# Patient Record
Sex: Female | Born: 1990 | Race: Black or African American | Hispanic: No | Marital: Single | Smoking: Current every day smoker
Health system: Southern US, Community
[De-identification: ages and names within clinical notes are randomized; demographics above are authoritative.]

## PROBLEM LIST (undated history)

## (undated) ENCOUNTER — Emergency Department (HOSPITAL_COMMUNITY): Discharge status: Absent without leave | Payer: Self-pay

## (undated) DIAGNOSIS — O139 Gestational [pregnancy-induced] hypertension without significant proteinuria, unspecified trimester: Secondary | ICD-10-CM

## (undated) DIAGNOSIS — E119 Type 2 diabetes mellitus without complications: Secondary | ICD-10-CM

## (undated) DIAGNOSIS — O365990 Maternal care for other known or suspected poor fetal growth, unspecified trimester, not applicable or unspecified: Secondary | ICD-10-CM

## (undated) DIAGNOSIS — N390 Urinary tract infection, site not specified: Secondary | ICD-10-CM

## (undated) DIAGNOSIS — Z22330 Carrier of Group B streptococcus: Secondary | ICD-10-CM

## (undated) HISTORY — PX: HERNIA REPAIR: SHX51

## (undated) HISTORY — DX: Carrier of group B Streptococcus: Z22.330

---
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
## 2011-01-03 NOTE — Discharge Summary (Signed)
  NAMEAFTEN, LIPSEY              ACCOUNT NO.:  [id number]  MEDICAL RECORD NO.:  [id number]           PATIENT TYPE:  I  LOCATION:  9306                          FACILITY:  [facility]  PHYSICIAN:  Lesly Dukes, M.D. DATE OF BIRTH:  1991-04-12  DATE OF ADMISSION:  12/15/2010 DATE OF DISCHARGE:  12/18/2010                              DISCHARGE SUMMARY   HOSPITAL COURSE:  The patient is a 20 year old G3, para 2-0-0-2 who was admitted as 18 weeks and 5 days to the [facility] for pyelonephritis.  The patient's primary care doctor is Dr. Wallene Huh at [facility].  The patient had back pain, chills and fever and also had nausea, vomiting, and diarrhea.  The patient was started on Rocephin 1 gram q.12 h.  The patient defervesced quickly and started feeling remarkably better.  She has been afebrile approximately at 20 hours at this point and is ready for discharge.  She is tolerating solid food and is not having any more diarrhea.  Her white cell count was originally 31.8 and decreased to 10.  Her urine culture E. coli sensitive to cefazolin.  PAST MEDICAL HISTORY:  Asthma and anemia.  PAST SURGICAL HISTORY:  Hernia repair.  LABORATORY DATA:  White blood cell count 10, hemoglobin 11.5, hematocrit 32.9.  Urine culture E. coli sensitive to Ancef.  MEDICATIONS ON DISCHARGE: 1. Albuterol inhaler 2 puffs every 6 hours p.r.n. 2. Prenatal vitamin 1 tablet p.o. daily. 3. Keflex 500 mg 1 tablet p.o. q.6 h. for 12 days and 1 tablet p.o.     daily.  DISCHARGE INSTRUCTIONS: 1. The patient to take temperature. If feel hot, rigors, or chills to     come to the emergency room with any of those problems. 2. The patient to return to emergency room with flank pain. 3. Continue prenatal precautions.  DISCHARGE APPOINTMENT:  The patient states she has an appointment already set up with Dr. Wallene Huh on June 8.     Lesly Dukes, M.D.    Lora Paula  D:  12/18/2010  T:   12/18/2010  Job:  [id number]  Electronically Signed by Elsie Lincoln M.D. on 01/03/2011 01:41:36 PM

## 2011-01-21 ENCOUNTER — Ambulatory Visit (INDEPENDENT_AMBULATORY_CARE_PROVIDER_SITE_OTHER): Payer: Medicaid Other | Admitting: Family Medicine

## 2011-01-21 DIAGNOSIS — F17200 Nicotine dependence, unspecified, uncomplicated: Secondary | ICD-10-CM

## 2011-01-21 DIAGNOSIS — Z3480 Encounter for supervision of other normal pregnancy, unspecified trimester: Secondary | ICD-10-CM

## 2011-01-21 DIAGNOSIS — N12 Tubulo-interstitial nephritis, not specified as acute or chronic: Secondary | ICD-10-CM

## 2011-01-21 DIAGNOSIS — Z22330 Carrier of Group B streptococcus: Secondary | ICD-10-CM

## 2011-01-21 DIAGNOSIS — O2390 Unspecified genitourinary tract infection in pregnancy, unspecified trimester: Secondary | ICD-10-CM

## 2011-01-21 DIAGNOSIS — O2300 Infections of kidney in pregnancy, unspecified trimester: Secondary | ICD-10-CM

## 2011-01-21 NOTE — Patient Instructions (Signed)
I am concerned that the bites on your legs are from fleas.  You can try an strong anti-itch medicine called pramoxine, brand name is Itch-X.  You should also get a "flea bomb" for your house to get rid of them in the carpet.   Please make an appointment to be seen in 4 weeks, we will re-check your blood sugar with the sweet drink, so you will need to be here for an hour.

## 2011-01-22 ENCOUNTER — Encounter: Payer: Self-pay | Admitting: Family Medicine

## 2011-01-22 NOTE — Progress Notes (Signed)
Joanna [name] is a 20 year old G3P2002 @ [redacted]w[redacted]d by 12 week [place] (unknown LMP) who presents for follow up.  She had pyelonephritis during this pregnancy, and is taking prophylactic Keflex, no new symptoms.  She is currently smoking about 3 cigarettes a day, prior to finding out she was pregnant she was smoking about 1/2 ppd.   She complains about bites on her lower legs, is worired about scabies, but says they kept a dog in their home for a few weeks and she did see fleas.  Exam consistent with flea bites, not scabies.   - plan on 28 week labs at next visit, repeat Glucola, CBC, HIV, RPR. -pramoxine, brand name is Itch-X for bites, advised doing a "flea bomb" in her home to eradicate the fleas.  - Plans on breast feeding  - Declined genetic screening - Advised to quit smoking. Precontemplative about quitting all the way. Smoked throughout last pregnancies  - 20 year old, 20 year old at home  - Patient planning on attending parenting class, declines birthing classes or breast feeding classes.  - GBS positive urine culture, plan on PCN during labor - Pyelonephritis - now on daily Keflex  - Plans to bring baby to [facility]  - Uncertain about plans for contraception - Follow up in 4 weeks.

## 2011-02-02 ENCOUNTER — Inpatient Hospital Stay (INDEPENDENT_AMBULATORY_CARE_PROVIDER_SITE_OTHER)
Admission: RE | Admit: 2011-02-02 | Discharge: 2011-02-02 | Disposition: A | Discharge status: Home / Self Care | Payer: Medicaid Other | Source: Ambulatory Visit | Attending: Family Medicine | Admitting: Family Medicine

## 2011-02-02 DIAGNOSIS — H109 Unspecified conjunctivitis: Secondary | ICD-10-CM

## 2011-02-18 ENCOUNTER — Ambulatory Visit (INDEPENDENT_AMBULATORY_CARE_PROVIDER_SITE_OTHER): Payer: Medicaid Other | Admitting: Family Medicine

## 2011-02-18 VITALS — BP 115/69 | Wt 208.2 lb

## 2011-02-18 DIAGNOSIS — O2390 Unspecified genitourinary tract infection in pregnancy, unspecified trimester: Secondary | ICD-10-CM

## 2011-02-18 DIAGNOSIS — Z331 Pregnant state, incidental: Secondary | ICD-10-CM

## 2011-02-18 DIAGNOSIS — N12 Tubulo-interstitial nephritis, not specified as acute or chronic: Secondary | ICD-10-CM

## 2011-02-18 DIAGNOSIS — F17200 Nicotine dependence, unspecified, uncomplicated: Secondary | ICD-10-CM

## 2011-02-18 DIAGNOSIS — Z3480 Encounter for supervision of other normal pregnancy, unspecified trimester: Secondary | ICD-10-CM

## 2011-02-18 DIAGNOSIS — Z22330 Carrier of Group B streptococcus: Secondary | ICD-10-CM

## 2011-02-18 DIAGNOSIS — O2300 Infections of kidney in pregnancy, unspecified trimester: Secondary | ICD-10-CM

## 2011-02-18 LAB — CBC
HCT: 36.2 % (ref 36.0–46.0)
Hemoglobin: 12.4 g/dL (ref 12.0–15.0)
MCH: 30.4 pg (ref 26.0–34.0)
MCHC: 34.3 g/dL (ref 30.0–36.0)
RBC: 4.08 MIL/uL (ref 3.87–5.11)

## 2011-02-18 NOTE — Progress Notes (Signed)
Joanna [name] is a 20 year old G3P2002 @ [redacted]w[redacted]d by 12 week [place] (unknown LMP) who presents for follow up.  She had pyelonephritis during this pregnancy, and is taking prophylactic Keflex, no new symptoms.  She is currently smoking about 3 cigarettes a day.  She says she is not ready to quit, is not sure why. No complaints.   - plan on 28 week labs today,Glucola, CBC, HIV, RPR. - Plans on breast feeding  - Wants Mirena after delivery.  - Is having a boy, wants him circumcised, but not at [facility] due to price.  - Advised to quit smoking. Precontemplative about quitting all the way. Smoked throughout last pregnancies  - GBS positive urine culture, plan on PCN during labor - Pyelonephritis - now on daily Keflex  - Plans to bring baby to [facility]  - Follow up in 2 weeks.

## 2011-02-18 NOTE — Patient Instructions (Signed)
It was good to see you.  You are 27 weeks and 6 days along, and your due date is October 27th.  Keep taking your prenatal vitamin, and let me know if you have any questions about quitting smoking.

## 2011-02-19 LAB — RPR

## 2011-02-22 ENCOUNTER — Encounter: Payer: Self-pay | Admitting: Family Medicine

## 2011-03-04 ENCOUNTER — Ambulatory Visit (INDEPENDENT_AMBULATORY_CARE_PROVIDER_SITE_OTHER): Payer: Medicaid Other | Admitting: Family Medicine

## 2011-03-04 VITALS — BP 113/67 | Wt 208.0 lb

## 2011-03-04 DIAGNOSIS — Z3480 Encounter for supervision of other normal pregnancy, unspecified trimester: Secondary | ICD-10-CM

## 2011-03-04 NOTE — Progress Notes (Signed)
Asyah Candler is a 20 year old G3P2002 @ [redacted]w[redacted]d by 12 week [place] (unknown LMP) who presents for follow up.  She had pyelonephritis during this pregnancy, and is taking prophylactic Keflex.  She is currently smoking about 3 cigarettes a day. No complaints.   -  28 week labs reviewed.  - Plans on breast feeding  - Wants Mirena or Implanon after delivery.  - Is having a boy, wants him circumcised, but not at [facility] due to price, info given today for out patient clinics that do circs.  - Advised to quit smoking. Precontemplative about quitting all the way. Smoked throughout last pregnancies  - GBS positive urine culture, plan on PCN during labor - Pyelonephritis - now on daily Keflex  - Plans to bring baby to [facility]  - Follow up in 2 weeks with OB clinic.

## 2011-03-04 NOTE — Patient Instructions (Addendum)
It was good to see you.  All your labs your last visit were normal.  Please make an appointment to be seen here in our OB clinic, either in 2 weeks or 4 weeks, and to see me the other time.  Keep taking your prenatal vitamin.

## 2011-03-06 ENCOUNTER — Encounter (HOSPITAL_COMMUNITY): Payer: Self-pay | Admitting: *Deleted

## 2011-03-06 ENCOUNTER — Inpatient Hospital Stay (HOSPITAL_COMMUNITY)
Admission: AD | Admit: 2011-03-06 | Discharge: 2011-03-06 | Disposition: A | Discharge status: Home / Self Care | Payer: Medicaid Other | Source: Ambulatory Visit | Attending: Obstetrics & Gynecology | Admitting: Obstetrics & Gynecology

## 2011-03-06 DIAGNOSIS — N390 Urinary tract infection, site not specified: Secondary | ICD-10-CM | POA: Insufficient documentation

## 2011-03-06 DIAGNOSIS — O99891 Other specified diseases and conditions complicating pregnancy: Secondary | ICD-10-CM | POA: Insufficient documentation

## 2011-03-06 DIAGNOSIS — M549 Dorsalgia, unspecified: Secondary | ICD-10-CM

## 2011-03-06 DIAGNOSIS — O212 Late vomiting of pregnancy: Secondary | ICD-10-CM | POA: Insufficient documentation

## 2011-03-06 HISTORY — DX: Urinary tract infection, site not specified: N39.0

## 2011-03-06 LAB — URINALYSIS, MICROSCOPIC ONLY
Glucose, UA: NEGATIVE mg/dL
Leukocytes, UA: NEGATIVE
Protein, ur: NEGATIVE mg/dL
Specific Gravity, Urine: >1.03 — ABNORMAL HIGH (ref 1.005–1.030)
Urobilinogen, UA: 1 mg/dL (ref 0.0–1.0)

## 2011-03-06 MED ORDER — OXYCODONE-ACETAMINOPHEN 5-325 MG PO TABS
1.0000 | ORAL_TABLET | ORAL | Status: DC | PRN
  Administered 2011-03-06: 1 via ORAL
  Filled 2011-03-06: qty 1

## 2011-03-06 MED ORDER — ONDANSETRON HCL 4 MG PO TABS
8.0000 mg | ORAL_TABLET | Freq: Once | ORAL | Status: AC
  Administered 2011-03-06: 8 mg via ORAL
  Filled 2011-03-06: qty 2

## 2011-03-06 NOTE — ED Provider Notes (Signed)
History     CSN: [id number] Arrival date & time: 03/06/2011  4:42 PM  Chief Complaint  Patient presents with  . Emesis During Pregnancy  . Flank Pain   HPI  No past medical history on file.  No past surgical history on file.  No family history on file.  History  Substance Use Topics  . Smoking status: Current Everyday Smoker -- 0.2 packs/day    Types: Cigarettes  . Smokeless tobacco: Never Used  . Alcohol Use: No    OB History    Grav Para Term Preterm Abortions TAB SAB Ect Mult Living   3 2 2  0 0 0 0 0 0 2      Review of Systems  Physical Exam  BP 131/70  Pulse 94  Temp(Src) 97.9 F (36.6 C) (Oral)  Resp 18  Ht 5\' 3"  (1.6 m)  Wt 94.802 kg (209 lb)  BMI 37.02 kg/m2  Breastfeeding? Unknown  Physical Exam  ED Course  Procedures  MDM Urine clear with 15 ketones. Will po hydrate and if holds down fluis will d/c home.

## 2011-03-06 NOTE — Progress Notes (Signed)
Pt reports back pain for 2 weeks. Vomiting x3 today

## 2011-03-06 NOTE — Progress Notes (Signed)
Pt requesting d/c home. Stable maternal fetal unit will d/c.

## 2011-03-06 NOTE — ED Provider Notes (Signed)
History   in with c/o constant bilateral back pain and vomiting x 3 today.  Chief Complaint  Patient presents with  . Emesis During Pregnancy  . Flank Pain   HPI  OB History    Grav Para Term Preterm Abortions TAB SAB Ect Mult Living   3 2 2  0 0 0 0 0 0 2      No past medical history on file.  No past surgical history on file.  No family history on file.  History  Substance Use Topics  . Smoking status: Current Everyday Smoker -- 0.2 packs/day    Types: Cigarettes  . Smokeless tobacco: Never Used  . Alcohol Use: No    Allergies: No Known Allergies  Prescriptions prior to admission  Medication Sig Dispense Refill  . albuterol (VENTOLIN HFA) 108 (90 BASE) MCG/ACT inhaler Inhale 2 puffs into the lungs as needed. For shortness of breath      . cephALEXin (KEFLEX) 500 MG capsule Take 500 mg by mouth 4 (four) times daily. 1 tab PO QID starting 6/2 for 12 days then one tab PO daily through pregnancy      . Prenatal Multivit-Min-Fe-FA (PRENATAL VITAMINS) 0.8 MG tablet Take 1 tablet by mouth daily.         ROS Physical Exam   Blood pressure 131/70, pulse 94, temperature 97.9 F (36.6 C), temperature source Oral, resp. rate 18, height 5\' 3"  (1.6 m), weight 94.802 kg (209 lb), unknown if currently breastfeeding.  Physical Exam  Constitutional: She is oriented to person, place, and time. She appears well-developed and well-nourished.  HENT:  Head: Normocephalic.  Eyes: Conjunctivae are normal. Pupils are equal, round, and reactive to light.  Neck: Normal range of motion.  Cardiovascular: Normal rate, regular rhythm and normal heart sounds.   Respiratory: Effort normal and breath sounds normal.  GI: Soft. Bowel sounds are normal.       abd gravis and nontender.  Genitourinary: Vagina normal.  Musculoskeletal: Normal range of motion.  Neurological: She is alert and oriented to person, place, and time. She has normal reflexes.  Skin: Skin is warm and dry.  Psychiatric: She  has a normal mood and affect. Her behavior is normal. Judgment and thought content normal.    MAU Course  Procedures urinalysis  MDM UTI vs common discomfort of pregnancy  Assessment and Plan  Assess u/a if neg will d/c home.  Zerita Boers 03/06/2011, 5:26 PM

## 2011-03-06 NOTE — ED Provider Notes (Signed)
Attestation of Attending Supervision of Advanced Practitioner: Evaluation and management procedures were performed by the PA/NP/CNM/OB Fellow under my supervision/collaboration. Chart reviewed and agree with management and plan.  Elfa Wooton A 03/06/2011 5:59 PM

## 2011-03-06 NOTE — Progress Notes (Signed)
Pt reports having n/v since Thursday off and on. Reports now her sides hurt

## 2011-03-17 ENCOUNTER — Ambulatory Visit (INDEPENDENT_AMBULATORY_CARE_PROVIDER_SITE_OTHER): Payer: Medicaid Other | Admitting: Family Medicine

## 2011-03-17 VITALS — BP 114/75 | Wt 207.0 lb

## 2011-03-17 DIAGNOSIS — Z331 Pregnant state, incidental: Secondary | ICD-10-CM

## 2011-03-17 DIAGNOSIS — Z3480 Encounter for supervision of other normal pregnancy, unspecified trimester: Secondary | ICD-10-CM

## 2011-03-17 MED ORDER — ALBUTEROL SULFATE HFA 108 (90 BASE) MCG/ACT IN AERS: 2.0000 | INHALATION_SPRAY | RESPIRATORY_TRACT | Status: DC | PRN

## 2011-03-19 LAB — CULTURE, OB URINE

## 2011-03-25 NOTE — Progress Notes (Signed)
20 yo G3 here for routine follow up.  No complaints. Smoking 2 cigs per day. Using albuterol inhaler slightly more - would like refills. Taking iron pills and PNV. See flow sheet.  Lungs: CTAB with good air movement.  No wheeze or crackles. A/P: Pregnancy- Going well.  Will need to follow fetal growth.  If fundal height does not increase by next visit, consider repeat u/s.  Reviewed PTL precautions and kick counts. Smoking - cessation encouraged.   Infant feeding - breast Contraception- Mirena Anemia - on iron H/o pyelonephritis - daily keflex, check ucx today. F/u 2 weeks.

## 2011-04-04 ENCOUNTER — Ambulatory Visit (INDEPENDENT_AMBULATORY_CARE_PROVIDER_SITE_OTHER): Payer: Medicaid Other | Admitting: Family Medicine

## 2011-04-04 VITALS — BP 135/71 | Temp 97.8°F | Wt 207.0 lb

## 2011-04-04 DIAGNOSIS — O2300 Infections of kidney in pregnancy, unspecified trimester: Secondary | ICD-10-CM

## 2011-04-04 DIAGNOSIS — Z22330 Carrier of Group B streptococcus: Secondary | ICD-10-CM

## 2011-04-04 DIAGNOSIS — Z3480 Encounter for supervision of other normal pregnancy, unspecified trimester: Secondary | ICD-10-CM

## 2011-04-04 DIAGNOSIS — Z23 Encounter for immunization: Secondary | ICD-10-CM

## 2011-04-04 DIAGNOSIS — O2390 Unspecified genitourinary tract infection in pregnancy, unspecified trimester: Secondary | ICD-10-CM

## 2011-04-04 DIAGNOSIS — N12 Tubulo-interstitial nephritis, not specified as acute or chronic: Secondary | ICD-10-CM

## 2011-04-04 NOTE — Patient Instructions (Signed)
It was good to see you, you are 34 weeks and 2 days.  Keep taking your Keflex, your Prenatal vitamin, and your iron supplement.  If you have any vaginal bleeding, think you are going into labor, or that your water has broken, please go to [facility]  Please make an appointment to see me again in about two weeks.

## 2011-04-05 NOTE — Progress Notes (Signed)
20 yo G3P2002 @ [redacted]w[redacted]d.  No complaints today, pt still concerned about baby being breech on 20 week [place].  Used bedside US today in clinic , Vertex presentation on [place] today, Dr. [place] also confirmed Vertex presentation. Pt is smoking a few cigarettes a day, does not want to quit.  Taking Iron and PNV, taking keflex. Discussed Mirena further today, pt would like this as contraception. -Anticipate NSVD -GBS on initial pregnancy Urine Cx, plan to treat with PCN during labor - Plans to breast feed and Mirena for contraception - Continue Iron, Keflex, and PNV - Flu shot today. - follow up in 2 weeks.

## 2011-04-19 ENCOUNTER — Ambulatory Visit (INDEPENDENT_AMBULATORY_CARE_PROVIDER_SITE_OTHER): Payer: Medicaid Other | Admitting: Family Medicine

## 2011-04-19 VITALS — BP 127/79 | Temp 97.9°F | Wt 209.0 lb

## 2011-04-19 DIAGNOSIS — Z3480 Encounter for supervision of other normal pregnancy, unspecified trimester: Secondary | ICD-10-CM

## 2011-04-19 DIAGNOSIS — Z22330 Carrier of Group B streptococcus: Secondary | ICD-10-CM

## 2011-04-19 DIAGNOSIS — J45909 Unspecified asthma, uncomplicated: Secondary | ICD-10-CM

## 2011-04-19 DIAGNOSIS — F17200 Nicotine dependence, unspecified, uncomplicated: Secondary | ICD-10-CM

## 2011-04-19 DIAGNOSIS — Z331 Pregnant state, incidental: Secondary | ICD-10-CM

## 2011-04-19 DIAGNOSIS — O2390 Unspecified genitourinary tract infection in pregnancy, unspecified trimester: Secondary | ICD-10-CM

## 2011-04-19 DIAGNOSIS — N12 Tubulo-interstitial nephritis, not specified as acute or chronic: Secondary | ICD-10-CM

## 2011-04-19 DIAGNOSIS — O2300 Infections of kidney in pregnancy, unspecified trimester: Secondary | ICD-10-CM

## 2011-04-19 MED ORDER — ALBUTEROL SULFATE HFA 108 (90 BASE) MCG/ACT IN AERS: 2.0000 | INHALATION_SPRAY | RESPIRATORY_TRACT | Status: DC | PRN

## 2011-04-19 NOTE — Progress Notes (Signed)
Joanna [name] presents for follow up.  She is not feeling well, has a stuffy nose and cough.  No fevers. She says she is not smoking much because of the cold.  She has not taken anything for it.  Taking her PNV, Iron, and keflex.   BP 127/79  Temp 97.9 F (36.6 C)  Wt 209 lb (94.802 kg)  Breastfeeding? Unknown General appearance: alert, cooperative and no distress Eyes: conjunctiva clear, EOMIT, PERRL. Nose: clear discharge, no sinus tenderness Throat: lips, mucosa, and tongue normal; teeth and gums normal Lungs: clear to auscultation bilaterally and no wheezes.  Heart: regular rate and rhythm, S1, S2 normal, no murmur, click, rub or gallop  20yo G3P2002 @ 36w 3d.  - GBS + by early urine culture.  - Advised not to smoke, especially considering current URI.  -Keflex for Pyelo Prophylaxis.  - GC/Chlamydia screen today.  - Mirena for contraception - Plans to breast feed -Refill albuterol.  -Follow up in one week.

## 2011-04-19 NOTE — Patient Instructions (Addendum)
It was good to see you.  I have refilled your inhaler.  It is Ok to take over the counter medications for your cold.  Please make an appointment to see me again in a week.  Remember if you think you are going into labor, think your water broke, are having vaginal bleeding, or do not feel the baby is moving well, [facility].

## 2011-04-20 LAB — GC/CHLAMYDIA PROBE AMP, GENITAL: GC Probe Amp, Genital: NEGATIVE

## 2011-04-22 LAB — URINALYSIS, ROUTINE W REFLEX MICROSCOPIC
Bilirubin Urine: NEGATIVE
Glucose, UA: NEGATIVE mg/dL
Glucose, UA: NEGATIVE mg/dL
Hgb urine dipstick: NEGATIVE
Ketones, ur: NEGATIVE mg/dL
Leukocytes, UA: NEGATIVE
Nitrite: NEGATIVE
Nitrite: NEGATIVE
Protein, ur: NEGATIVE mg/dL
Specific Gravity, Urine: 1.02 (ref 1.005–1.030)
Urobilinogen, UA: 0.2 mg/dL (ref 0.0–1.0)
pH: 6.5 (ref 5.0–8.0)

## 2011-04-22 LAB — URINE MICROSCOPIC-ADD ON

## 2011-04-23 ENCOUNTER — Encounter (HOSPITAL_COMMUNITY): Payer: Self-pay | Admitting: Obstetrics and Gynecology

## 2011-04-23 ENCOUNTER — Inpatient Hospital Stay (HOSPITAL_COMMUNITY)
Admission: AD | Admit: 2011-04-23 | Discharge: 2011-04-24 | Disposition: A | Discharge status: Home / Self Care | Payer: Medicaid Other | Source: Ambulatory Visit | Attending: Family Medicine | Admitting: Family Medicine

## 2011-04-23 ENCOUNTER — Encounter (HOSPITAL_COMMUNITY): Payer: Self-pay

## 2011-04-23 ENCOUNTER — Inpatient Hospital Stay (HOSPITAL_COMMUNITY)
Admission: AD | Admit: 2011-04-23 | Discharge: 2011-04-23 | Disposition: A | Discharge status: Home / Self Care | Payer: Medicaid Other | Source: Ambulatory Visit | Attending: Obstetrics and Gynecology | Admitting: Obstetrics and Gynecology

## 2011-04-23 DIAGNOSIS — O4700 False labor before 37 completed weeks of gestation, unspecified trimester: Secondary | ICD-10-CM | POA: Insufficient documentation

## 2011-04-23 DIAGNOSIS — J45901 Unspecified asthma with (acute) exacerbation: Secondary | ICD-10-CM | POA: Insufficient documentation

## 2011-04-23 DIAGNOSIS — O99891 Other specified diseases and conditions complicating pregnancy: Secondary | ICD-10-CM | POA: Insufficient documentation

## 2011-04-23 DIAGNOSIS — Z3480 Encounter for supervision of other normal pregnancy, unspecified trimester: Secondary | ICD-10-CM

## 2011-04-23 MED ORDER — ALBUTEROL SULFATE (5 MG/ML) 0.5% IN NEBU
2.5000 mg | INHALATION_SOLUTION | Freq: Once | RESPIRATORY_TRACT | Status: AC
  Administered 2011-04-23: 2.5 mg via RESPIRATORY_TRACT
  Filled 2011-04-23: qty 0.5

## 2011-04-23 MED ORDER — IPRATROPIUM BROMIDE 0.02 % IN SOLN
0.5000 mg | Freq: Once | RESPIRATORY_TRACT | Status: AC
  Administered 2011-04-23: 0.5 mg via RESPIRATORY_TRACT
  Filled 2011-04-23: qty 2.5

## 2011-04-23 NOTE — Progress Notes (Signed)
Respiratory at bedside for nebulizer treatment

## 2011-04-23 NOTE — Progress Notes (Signed)
Patient is here for labor check. She states that she has been having ctx q37mins for 2 hours. She denies any vaginal bleeding, lof or discharge. She reports good fetal movement.

## 2011-04-23 NOTE — Progress Notes (Signed)
Pt states, "I've had a cold with a cough for three weeks. I've been short of breath since last night. I've been coughing up yellow mucus for a week."

## 2011-04-23 NOTE — Progress Notes (Signed)
At 0220am, i went to reassess patient and patient is not in the room. It appears that she has gotten  Dressed and left the unit. Patient did not inform staff that she was leaving. Dr Natale Milch made aware that patient left AMA.

## 2011-04-23 NOTE — ED Provider Notes (Addendum)
History     Chief Complaint  Patient presents with   Cough   Shortness of Breath   HPI Patient is G3P2002 at [redacted] wks GA who presents to the MAU complaining of productive cough with shortness of breath. She reports the cough has gotten progressively worse of the past 3 weeks. Now with yellow sputum production. No fever or known sick contacts. Patient has a history of asthma, but is out of her albuterol inhaler and has not Rx left. Symptoms worse with change of temperature. Patient was seen in the MAU last night for contractions and noted to have course breath sounds with wheezing and O2 sat of 92% on room air. Treated with nebulizer atrovent/albuterol. Patient subsequently left AMA after breathing improved and contractions subsided. Patient is a current smoker. She denies any contractions at this time. No vaginal discharge or rupture of fluid.  Prenatal [facility] is with [facility].  OB History    Grav Para Term Preterm Abortions TAB SAB Ect Mult Living   3 2 2  0 0 0 0 0 0 2      Past Medical History  Diagnosis Date   Asthma    Urinary tract infection     Past Surgical History  Procedure Date   No past surgeries     No family history on file.  History  Substance Use Topics   Smoking status: Current Everyday Smoker -- 0.5 packs/day    Types: Cigarettes   Smokeless tobacco: Never Used   Alcohol Use: No    Allergies: No Known Allergies  Prescriptions prior to admission  Medication Sig Dispense Refill   albuterol (PROVENTIL HFA;VENTOLIN HFA) 108 (90 BASE) MCG/ACT inhaler Inhale 2 puffs into the lungs as needed. For shortness of breath        cephALEXin (KEFLEX) 500 MG capsule Take 500 mg by mouth daily.        ferrous sulfate 325 (65 FE) MG tablet Take 325 mg by mouth daily with breakfast.         prenatal vitamin w/FE, FA (PRENATAL 1 + 1) 27-1 MG TABS Take 1 tablet by mouth daily.         Prenatal Multivit-Min-Fe-FA (PRENATAL VITAMINS) 0.8 MG tablet  Take 1 tablet by mouth daily.         Review of Systems  Respiratory: Negative for hemoptysis.   Cardiovascular: Negative for chest pain and palpitations.  Gastrointestinal: Negative for heartburn, nausea, abdominal pain, diarrhea and constipation. Vomiting: one episode of vomiting cause by cough.   Physical Exam   Blood pressure 126/78, pulse 96, temperature 98.9 F (37.2 C), temperature source Oral, resp. rate 20, height 6' 3.75" (1.924 m), weight 93.157 kg (205 lb 6 oz), SpO2 95.00%.  Physical Exam  Nursing note and vitals reviewed. Constitutional: She is oriented to person, place, and time. She appears well-developed and well-nourished. No distress.  HENT:  Head: Normocephalic and atraumatic.  Neck: Neck supple.  Cardiovascular: Normal rate, regular rhythm, normal heart sounds and intact distal pulses.  Exam reveals no gallop and no friction rub.   No murmur heard. Respiratory: No respiratory distress. She has wheezes. She exhibits no tenderness.       Course inspiratory breath sounds throughout  GI: Bowel sounds are normal. There is no tenderness. There is no rebound and no guarding.       Gravid  Neurological: She is alert and oriented to person, place, and time.  Skin: Skin is warm and dry.  Psychiatric:  She has a normal mood and affect. Her behavior is normal.    MAU Course  Procedures  Results for orders placed during the hospital encounter of 04/23/11 (from the past 24 hour(s))  CBC     Status: Abnormal   Collection Time   04/24/11 12:33 AM      Component Value Range   WBC 12.3 (*) 4.0 - 10.5 (K/uL)   RBC 4.02  3.87 - 5.11 (MIL/uL)   Hemoglobin 12.4  12.0 - 15.0 (g/dL)   HCT 16.1  09.6 - 04.5 (%)   MCV 89.8  78.0 - 100.0 (fL)   MCH 30.8  26.0 - 34.0 (pg)   MCHC 34.3  30.0 - 36.0 (g/dL)   RDW 40.9  81.1 - 91.4 (%)   Platelets 319  150 - 400 (K/uL)   Chest Xray - Brochitic changes noted Nebulizer tx - albuterol 2.5 mg solution (done) Prednisone 40 mg PO x 1  prior to discharge  Assessment and Plan  Assessment:  1. [id number] female at [redacted] wks GA with exacerbation of asthma  Plan: 1. Continuous O2 sat monitoring 2. Zpack 3. Prednisone 40 mg PO daily x 5 days 4. Follow up with OB/GYN clinic recommended  Joanna [name] 04/23/2011, 11:50 PM

## 2011-04-24 ENCOUNTER — Inpatient Hospital Stay (HOSPITAL_COMMUNITY): Payer: Medicaid Other

## 2011-04-24 LAB — CBC
Platelets: 319 10*3/uL (ref 150–400)
RBC: 4.02 MIL/uL (ref 3.87–5.11)
RDW: 14.1 % (ref 11.5–15.5)
WBC: 12.3 10*3/uL — ABNORMAL HIGH (ref 4.0–10.5)

## 2011-04-24 MED ORDER — AZITHROMYCIN 250 MG PO TABS: ORAL_TABLET | ORAL | Status: AC

## 2011-04-24 MED ORDER — PREDNISONE 20 MG PO TABS
40.0000 mg | ORAL_TABLET | Freq: Once | ORAL | Status: AC
  Administered 2011-04-24: 40 mg via ORAL
  Filled 2011-04-24: qty 2

## 2011-04-24 MED ORDER — PREDNISONE 20 MG PO TABS: 40.0000 mg | ORAL_TABLET | Freq: Once | ORAL | Status: AC

## 2011-04-24 MED ORDER — ALBUTEROL SULFATE (5 MG/ML) 0.5% IN NEBU: 2.5000 mg | INHALATION_SOLUTION | Freq: Once | RESPIRATORY_TRACT | Status: DC

## 2011-04-24 NOTE — Consults (Signed)
Consulted with Dr. Shawnie Pons; recommended giving steroid burst and zpack with follow-up in office next week.

## 2011-04-24 NOTE — ED Provider Notes (Signed)
I have interviewed and examined pt and agree with note above. [facility]

## 2011-04-24 NOTE — ED Provider Notes (Signed)
Pt reexamined and plan of care provided.  Agree with PA-S note.

## 2011-04-25 ENCOUNTER — Encounter (HOSPITAL_COMMUNITY): Payer: Self-pay | Admitting: *Deleted

## 2011-04-25 ENCOUNTER — Inpatient Hospital Stay (HOSPITAL_COMMUNITY)
Admission: AD | Admit: 2011-04-25 | Discharge: 2011-04-26 | Disposition: A | Discharge status: Home / Self Care | Payer: Medicaid Other | Source: Ambulatory Visit | Attending: Obstetrics & Gynecology | Admitting: Obstetrics & Gynecology

## 2011-04-25 DIAGNOSIS — O99330 Smoking (tobacco) complicating pregnancy, unspecified trimester: Secondary | ICD-10-CM | POA: Insufficient documentation

## 2011-04-25 DIAGNOSIS — N12 Tubulo-interstitial nephritis, not specified as acute or chronic: Secondary | ICD-10-CM

## 2011-04-25 DIAGNOSIS — J069 Acute upper respiratory infection, unspecified: Secondary | ICD-10-CM | POA: Insufficient documentation

## 2011-04-25 DIAGNOSIS — Z3480 Encounter for supervision of other normal pregnancy, unspecified trimester: Secondary | ICD-10-CM

## 2011-04-25 DIAGNOSIS — O99891 Other specified diseases and conditions complicating pregnancy: Secondary | ICD-10-CM | POA: Insufficient documentation

## 2011-04-25 DIAGNOSIS — O2300 Infections of kidney in pregnancy, unspecified trimester: Secondary | ICD-10-CM

## 2011-04-25 DIAGNOSIS — O2390 Unspecified genitourinary tract infection in pregnancy, unspecified trimester: Secondary | ICD-10-CM

## 2011-04-25 MED ORDER — ACETAMINOPHEN 325 MG PO TABS
650.0000 mg | ORAL_TABLET | Freq: Four times a day (QID) | ORAL | Status: DC | PRN
  Administered 2011-04-26: 650 mg via ORAL
  Filled 2011-04-25: qty 2

## 2011-04-25 MED ORDER — ONDANSETRON HCL 4 MG PO TABS
4.0000 mg | ORAL_TABLET | Freq: Three times a day (TID) | ORAL | Status: DC | PRN
  Administered 2011-04-26: 4 mg via ORAL
  Filled 2011-04-25: qty 1

## 2011-04-25 NOTE — Progress Notes (Signed)
Pt G3 P2 at 37.2wks having urinary frequency and pressure.  Pt denies problems with pregnancy.  GBS +

## 2011-04-25 NOTE — ED Provider Notes (Addendum)
History     Chief Complaint  Patient presents with  . Dysuria   HPI Comments:    Dysuria   Joanna [name] is a 20 year old G3P2 at 46.2 with a history of pyelonephritis during pregnancy who presents with right-sided lower abdominal pain. She describes is at aching and increasing in severity since onset at 6:00PM. She does not feel contraction, but describes it as pressure. However, she is concerned that she could be going into labor. She denies LOF, vaginal bleeding, or discharge. She notes one episode of vomiting at 8:00PM, which has resolved. She notes urinary frequency and stress incontinence. She denies back pain, fever, and chills.   Of note, the patient is taking daily Keflex for pyelonephritis prophylaxis. Also, she was seen in the MAU on 10/7 for an upper respiratory infection and started on Azithromycin. She notes that she must use her albuterol daily.   OB History    Grav Para Term Preterm Abortions TAB SAB Ect Mult Living   3 2 2  0 0 0 0 0 0 2      Past Medical History  Diagnosis Date  . Asthma   . Urinary tract infection    - Pyelonephritis 6/12 - Upper Respiratory Infection 7/12  Past Surgical History  Procedure Date  . No past surgeries       History  Substance Use Topics  . Smoking status: Current Everyday Smoker -- 0.5 packs/day    Types: Cigarettes  . Smokeless tobacco: Never Used  . Alcohol Use: No    Allergies: No Known Allergies  Prescriptions prior to admission  Medication Sig Dispense Refill  . albuterol (PROVENTIL HFA;VENTOLIN HFA) 108 (90 BASE) MCG/ACT inhaler Inhale 2 puffs into the lungs as needed. For shortness of breath       . azithromycin (ZITHROMAX Z-PAK) 250 MG tablet Take two days on day one, then one pill a day x 4 days.  6 each  0  . ferrous sulfate 325 (65 FE) MG tablet Take 325 mg by mouth daily with breakfast.        . predniSONE (DELTASONE) 20 MG tablet Take 2 tablets (40 mg total) by mouth once. Take once a day x 4 days.  8 tablet   0  . prenatal vitamin w/FE, FA (PRENATAL 1 + 1) 27-1 MG TABS Take 1 tablet by mouth daily.        . cephALEXin (KEFLEX) 500 MG capsule Take 500 mg by mouth daily.       * Azithromycin   Review of Systems  Genitourinary: Positive for dysuria.  All other systems reviewed and are negative.    Physical Exam   Blood pressure 139/79, pulse 100, temperature 98.4 F (36.9 C), temperature source Oral, resp. rate 20, height 5\' 3"  (1.6 m), weight 207 lb 3.2 oz (93.985 kg), SpO2 99.00%.  Physical Exam  Constitutional: She appears well-developed and well-nourished. No distress.  HENT:  Head: Normocephalic and atraumatic.  Mouth/Throat: Oropharynx is clear and moist. No oropharyngeal exudate.  Eyes: Conjunctivae and EOM are normal. Pupils are equal, round, and reactive to light. No scleral icterus.  Cardiovascular: Normal rate and regular rhythm.   No murmur heard. Respiratory: No respiratory distress. She has wheezes. She has rales.       Poor respiratory effort. Rales in RLL and expiratory wheezes in lower lobe bilaterally. Loud, persistent barking cough during examination.   GI:       Gravid, non-tender  Genitourinary:  Cervix closed, firm, and located posteriorly.   Musculoskeletal: She exhibits no edema.  Lymphadenopathy:    She has no cervical adenopathy.    MAU Course  Procedures Tocometer: Minimal uterine activity without contraction Fetal Monitor: Baseline 130, Varibility: Marked     Assessment and Plan  Joanna [name] is a 20 year old G3P2 at 51.2 who presents with concern for uterine contractions.   1. Smoking Cessation Counseling - Patient acknowledged the desire to quit smoking and wants to start now.  2. Rule Out Labor - The patient is not having uterine contractions after 1 hour of monitoring. Nor has she had rupture of membranes. Her cervix is closed and firm. Her pain is a normal for pregnancy and she is stable and appropriate for discharge.  3. URI - continue home  azithromycin 4. Pyelonephritis - continue Keflex for prophylaxis; U/A no evidence of UTI  5. Follow-up with Dr. Lula Olszewski at [facility] on  10/11     [place], [place] 04/25/2011, 11:49 PM   Seen and agree with above.  STINSON, JACOB [name] 04/26/2011 1:13 AM

## 2011-04-26 LAB — URINALYSIS, ROUTINE W REFLEX MICROSCOPIC
Glucose, UA: NEGATIVE mg/dL
Hgb urine dipstick: NEGATIVE
Specific Gravity, Urine: 1.025 (ref 1.005–1.030)
pH: 6.5 (ref 5.0–8.0)

## 2011-04-28 ENCOUNTER — Ambulatory Visit (INDEPENDENT_AMBULATORY_CARE_PROVIDER_SITE_OTHER): Payer: Medicaid Other | Admitting: Family Medicine

## 2011-04-28 VITALS — BP 125/75 | Wt 204.6 lb

## 2011-04-28 DIAGNOSIS — F17200 Nicotine dependence, unspecified, uncomplicated: Secondary | ICD-10-CM

## 2011-04-28 DIAGNOSIS — Z22330 Carrier of Group B streptococcus: Secondary | ICD-10-CM

## 2011-04-28 DIAGNOSIS — Z3480 Encounter for supervision of other normal pregnancy, unspecified trimester: Secondary | ICD-10-CM

## 2011-04-28 DIAGNOSIS — Z331 Pregnant state, incidental: Secondary | ICD-10-CM

## 2011-04-28 LAB — POCT UA - GLUCOSE/PROTEIN: Glucose, UA: NEGATIVE

## 2011-04-28 NOTE — Progress Notes (Signed)
Joanna [name] presents for follow up.  She says she was seen at [facility] for her wheezing and was put on prednisone, she is feeling better.  She complains of some nausea this morning.  She denies headaches, vision changes, swelling.  She says she has felt some contractions but they are not very strong. She reports good fetal movement.   BP 141/82  Wt 204 lb 9.6 oz (92.806 kg), Recheck BP= 125/75.  General appearance: alert, cooperative and no distress Eyes: conjunctiva clear, EOMIT, PERRL. Nose: clear discharge, no sinus tenderness Throat: lips, mucosa, and tongue normal; teeth and gums normal Lungs: clear to auscultation bilaterally and no wheezes. , upper airway congestion Heart: regular rate and rhythm, S1, S2 normal, no murmur, click, rub or gallop  20yo G3P2002 @ 105w5d - Concern for pre-eclampsia with initial BP and nausea this am.  Urine had trace protein, not meeting requirements for pre-eclampsia.  Will check a random urine protein/creatinine ratio.  Reviewed signs of pre-eclampsia and gave patient hand out.  - Labor precautions and fetal kick counts reviewed.  - GBS + by early urine culture, GC/Chlamydia negative.  - Advised not to smoke, especially considering current URI.  -Keflex for Pyelo Prophylaxis.  - Mirena for contraception - Plans to breast feed -Follow up in one week.

## 2011-04-29 LAB — PROTEIN / CREATININE RATIO, URINE
Creatinine, Urine: 243.7 mg/dL
Protein Creatinine Ratio: 0.05 (ref <0.15)
Total Protein, Urine: 12 mg/dL

## 2011-04-30 ENCOUNTER — Observation Stay (HOSPITAL_COMMUNITY)
Admission: AD | Admit: 2011-04-30 | Discharge: 2011-05-01 | DRG: 780 | Disposition: A | Discharge status: Home / Self Care | Payer: Medicaid Other | Source: Ambulatory Visit | Attending: Obstetrics and Gynecology | Admitting: Obstetrics and Gynecology

## 2011-04-30 ENCOUNTER — Encounter (HOSPITAL_COMMUNITY): Payer: Self-pay

## 2011-04-30 DIAGNOSIS — Z22330 Carrier of Group B streptococcus: Secondary | ICD-10-CM

## 2011-04-30 DIAGNOSIS — O479 False labor, unspecified: Principal | ICD-10-CM | POA: Diagnosis present

## 2011-04-30 DIAGNOSIS — O99891 Other specified diseases and conditions complicating pregnancy: Secondary | ICD-10-CM | POA: Diagnosis present

## 2011-04-30 DIAGNOSIS — O99330 Smoking (tobacco) complicating pregnancy, unspecified trimester: Secondary | ICD-10-CM | POA: Diagnosis present

## 2011-04-30 NOTE — Progress Notes (Signed)
G3P2 at 38wks. Ctxs since 2100. Pelvic pressure.

## 2011-04-30 NOTE — ED Provider Notes (Signed)
History     Chief Complaint  Patient presents with  . Contractions   HPI This is a 20 year old G3 P2 002 at 38 weeks and 0 days who is seen in the MAU for contractions that radiate to her back. Her contractions started at 9 PM have been progressing since that time. She currently rates the contraction pains at a 7/10. She is unsure of how close the contractions are together but subjectively feels that they are becoming closer. She denies decreased fetal activity, loss of fluid, vaginal bleeding, vaginal discharge, vision changes, headache, abdominal pain. She also had contractions that occurred last night and lasted until the morning.    OB History    Grav Para Term Preterm Abortions TAB SAB Ect Mult Living   3 2 2  0 0 0 0 0 0 2      Past Medical History  Diagnosis Date  . Asthma   . Urinary tract infection     Past Surgical History  Procedure Date  . No past surgeries     No family history on file.  History  Substance Use Topics  . Smoking status: Current Everyday Smoker -- 0.5 packs/day    Types: Cigarettes  . Smokeless tobacco: Never Used  . Alcohol Use: No    Allergies: No Known Allergies  Prescriptions prior to admission  Medication Sig Dispense Refill  . albuterol (PROVENTIL HFA;VENTOLIN HFA) 108 (90 BASE) MCG/ACT inhaler Inhale 2 puffs into the lungs as needed. For shortness of breath       . cephALEXin (KEFLEX) 500 MG capsule Take 500 mg by mouth daily.       . ferrous sulfate 325 (65 FE) MG tablet Take 325 mg by mouth daily with breakfast.        . predniSONE (DELTASONE) 20 MG tablet Take 2 tablets (40 mg total) by mouth once. Take once a day x 4 days.  8 tablet  0  . prenatal vitamin w/FE, FA (PRENATAL 1 + 1) 27-1 MG TABS Take 1 tablet by mouth daily.        Marland Kitchen azithromycin (ZITHROMAX Z-PAK) 250 MG tablet Take two days on day one, then one pill a day x 4 days.  6 each  0    Review of Systems  Constitutional: Negative for fever, chills and weight loss.    Eyes: Negative for blurred vision, double vision and photophobia.  Respiratory: Negative for cough, shortness of breath and wheezing.   Gastrointestinal: Negative for nausea, vomiting, abdominal pain, diarrhea and constipation.  Genitourinary: Negative for dysuria, urgency, frequency and hematuria.  Neurological: Negative for headaches.   Physical Exam   Blood pressure 137/73, pulse 110, temperature 98.6 F (37 C), temperature source Oral, resp. rate 22, height 5\' 3"  (1.6 m), weight 93.622 kg (206 lb 6.4 oz), SpO2 96.00%.  Physical Exam  Constitutional: She appears well-developed and well-nourished.  Cardiovascular: Normal rate, regular rhythm and normal heart sounds.  Exam reveals no gallop and no friction rub.   No murmur heard. Respiratory: Effort normal.       Wheezes heard in the right upper quadrant. There no rales. There's no increased work of breathing.  GI: Soft. Bowel sounds are normal. She exhibits no distension. There is no tenderness.       Fundal height measures 2 term gestational size. The fetus is vertex and orientation by [employer] and approximately 7 pounds estimated fetal weight by [employer].   Dilation: 4 Effacement (%): 60 Station: -3 Presentation: Vertex  Exam by:: Lucy Chris RNC  NST shows baseline rate in the 140s with accelerations present. There's moderate variability. There is no decelerations evident. Contractions approximately every 5 to 7 minutes.  Prenatal labs: ABO, Rh: A/POS/-- (03/30 1611) Antibody: NEG (03/30 1611) Rubella:   RPR: NON REAC (08/03 1518)  HBsAg: NEGATIVE (03/30 1611)  HIV: NON REACTIVE (08/03 1518)  GBS: Positive (09/12 0000)   MAU Course  Procedures    Assessment and Plan  1.  [id number] with IUP at 38 weeks 1 day 2.  GBS + 3.  Tobacco use  Patient has cervical change.  Will admit to birthing suits and continue with expectant management toward a vaginal delivery.  [facility] will be the pediatrician and the  patient will breastfeed.  She is planning on a Mirena IUD for contraception following the delivery.  STINSON, JACOB [name] 04/30/2011, 11:33 PM

## 2011-05-01 ENCOUNTER — Encounter (HOSPITAL_COMMUNITY): Payer: Self-pay | Admitting: *Deleted

## 2011-05-01 ENCOUNTER — Telehealth: Payer: Self-pay | Admitting: Family Medicine

## 2011-05-01 ENCOUNTER — Inpatient Hospital Stay (HOSPITAL_COMMUNITY)
Admission: AD | Admit: 2011-05-01 | Discharge: 2011-05-01 | Disposition: A | Discharge status: Home / Self Care | Payer: Medicaid Other | Source: Ambulatory Visit | Attending: Obstetrics & Gynecology | Admitting: Obstetrics & Gynecology

## 2011-05-01 DIAGNOSIS — Z3480 Encounter for supervision of other normal pregnancy, unspecified trimester: Secondary | ICD-10-CM

## 2011-05-01 DIAGNOSIS — O479 False labor, unspecified: Secondary | ICD-10-CM | POA: Insufficient documentation

## 2011-05-01 DIAGNOSIS — Z22330 Carrier of Group B streptococcus: Secondary | ICD-10-CM

## 2011-05-01 LAB — CBC
HCT: 35.9 % — ABNORMAL LOW (ref 36.0–46.0)
Hemoglobin: 12.3 g/dL (ref 12.0–15.0)
MCH: 30.4 pg (ref 26.0–34.0)
MCHC: 34.3 g/dL (ref 30.0–36.0)
RBC: 4.05 MIL/uL (ref 3.87–5.11)

## 2011-05-01 MED ORDER — OXYTOCIN 20 UNITS IN LACTATED RINGERS INFUSION - SIMPLE: 125.0000 mL/h | Freq: Once | INTRAVENOUS | Status: DC

## 2011-05-01 MED ORDER — NALBUPHINE SYRINGE 5 MG/0.5 ML: 5.0000 mg | INJECTION | INTRAMUSCULAR | Status: DC | PRN

## 2011-05-01 MED ORDER — LACTATED RINGERS IV SOLN
INTRAVENOUS | Status: DC
  Administered 2011-05-01: 01:00:00 via INTRAVENOUS

## 2011-05-01 MED ORDER — IBUPROFEN 600 MG PO TABS: 600.0000 mg | ORAL_TABLET | Freq: Four times a day (QID) | ORAL | Status: DC | PRN

## 2011-05-01 MED ORDER — LACTATED RINGERS IV SOLN: 500.0000 mL | INTRAVENOUS | Status: DC | PRN

## 2011-05-01 MED ORDER — PHENYLEPHRINE 40 MCG/ML (10ML) SYRINGE FOR IV PUSH (FOR BLOOD PRESSURE SUPPORT): 80.0000 ug | PREFILLED_SYRINGE | INTRAVENOUS | Status: DC | PRN

## 2011-05-01 MED ORDER — PENICILLIN G POTASSIUM 5000000 UNITS IJ SOLR
5.0000 10*6.[IU] | Freq: Once | INTRAVENOUS | Status: AC
  Administered 2011-05-01: 5 10*6.[IU] via INTRAVENOUS
  Filled 2011-05-01: qty 5

## 2011-05-01 MED ORDER — LACTATED RINGERS IV SOLN: 500.0000 mL | Freq: Once | INTRAVENOUS | Status: DC

## 2011-05-01 MED ORDER — FENTANYL 2.5 MCG/ML BUPIVACAINE 1/10 % EPIDURAL INFUSION (WH - ANES)
14.0000 mL/h | INTRAMUSCULAR | Status: DC
  Filled 2011-05-01: qty 60

## 2011-05-01 MED ORDER — EPHEDRINE 5 MG/ML INJ: 10.0000 mg | INTRAVENOUS | Status: DC | PRN

## 2011-05-01 MED ORDER — OXYCODONE-ACETAMINOPHEN 5-325 MG PO TABS: 2.0000 | ORAL_TABLET | ORAL | Status: DC | PRN

## 2011-05-01 MED ORDER — ONDANSETRON HCL 4 MG/2ML IJ SOLN: 4.0000 mg | Freq: Four times a day (QID) | INTRAMUSCULAR | Status: DC | PRN

## 2011-05-01 MED ORDER — EPHEDRINE 5 MG/ML INJ
10.0000 mg | INTRAVENOUS | Status: DC | PRN
  Filled 2011-05-01: qty 4

## 2011-05-01 MED ORDER — PHENYLEPHRINE 40 MCG/ML (10ML) SYRINGE FOR IV PUSH (FOR BLOOD PRESSURE SUPPORT)
80.0000 ug | PREFILLED_SYRINGE | INTRAVENOUS | Status: DC | PRN
  Filled 2011-05-01: qty 5

## 2011-05-01 MED ORDER — DIPHENHYDRAMINE HCL 50 MG/ML IJ SOLN: 12.5000 mg | INTRAMUSCULAR | Status: DC | PRN

## 2011-05-01 MED ORDER — PENICILLIN G POTASSIUM 5000000 UNITS IJ SOLR
2.5000 10*6.[IU] | INTRAVENOUS | Status: DC
  Filled 2011-05-01 (×4): qty 2.5

## 2011-05-01 MED ORDER — FLEET ENEMA 7-19 GM/118ML RE ENEM: 1.0000 | ENEMA | RECTAL | Status: DC | PRN

## 2011-05-01 MED ORDER — ACETAMINOPHEN 325 MG PO TABS: 650.0000 mg | ORAL_TABLET | ORAL | Status: DC | PRN

## 2011-05-01 MED ORDER — OXYTOCIN BOLUS FROM INFUSION
500.0000 mL | Freq: Once | INTRAVENOUS | Status: DC
  Filled 2011-05-01: qty 500

## 2011-05-01 MED ORDER — CITRIC ACID-SODIUM CITRATE 334-500 MG/5ML PO SOLN: 30.0000 mL | ORAL | Status: DC | PRN

## 2011-05-01 MED ORDER — LIDOCAINE HCL (PF) 1 % IJ SOLN: 30.0000 mL | INTRAMUSCULAR | Status: DC | PRN

## 2011-05-01 NOTE — Discharge Summary (Signed)
Physician Discharge Summary  Patient ID: DEYJA SOCHACKI MRN: [id number] DOB/AGE: [birth year] 19 y.o.  Admit date: 04/30/2011 Discharge date: 05/01/2011  Admission Diagnoses:  #1 Intrauterine pregnancy at 38 weeks, 1 day.     #2 GBS positive     #3  Braxton Hicks contractions   Discharge Diagnoses:  Active Problems:  Braxton Hick's contraction   Discharged Condition: good  Hospital Course: The patient was admitted with mild cervical change on 10/13.  After 4 hours, the patient's cervix was rechecked and the patient was determined to not be in labor.  In addition, the patient was having inadequate contractions.  The patient was sent home in stable condition.  Consults: none  Significant Diagnostic Studies: none  Treatments: none  Discharge Exam: Blood pressure 125/79, pulse 94, temperature 98.2 F (36.8 C), temperature source Oral, resp. rate 20, height 5\' 3"  (1.6 m), weight 93.622 kg (206 lb 6.4 oz), SpO2 96.00%. General appearance: alert, cooperative and no distress Head: Normocephalic, without obvious abnormality, atraumatic Resp: clear to auscultation bilaterally Chest wall: no tenderness Dilation: 3.5 Effacement (%): 60 Station: -2 Presentation: Vertex Exam by:: dr Adrian Blackwater   Disposition: Home or Self Care  Discharge Orders    Future Appointments: Provider: Department: Dept Phone: Center:   05/05/2011 10:00 AM Ardyth Gal, MD [facility] [phone] [facility]     Discharge Medication List as of 05/01/2011  3:00 AM    CONTINUE these medications which have NOT CHANGED   Details  albuterol (PROVENTIL HFA;VENTOLIN HFA) 108 (90 BASE) MCG/ACT inhaler Inhale 2 puffs into the lungs as needed. For shortness of breath , Until Discontinued, Historical Med    cephALEXin (KEFLEX) 500 MG capsule Take 500 mg by mouth daily. , Until Discontinued, Historical Med    ferrous sulfate 325 (65 FE) MG tablet Take 325 mg by mouth daily with breakfast.  , Until  Discontinued, Historical Med    predniSONE (DELTASONE) 20 MG tablet Take 2 tablets (40 mg total) by mouth once. Take once a day x 4 days., Starting 04/24/2011, Until Wed 05/04/11, Normal    prenatal vitamin w/FE, FA (PRENATAL 1 + 1) 27-1 MG TABS Take 1 tablet by mouth daily.  , Until Discontinued, Historical Med      STOP taking these medications     azithromycin (ZITHROMAX Z-PAK) 250 MG tablet        Follow-up Information    Follow up with [facility], [place] in 4 days.   Contact information:   [address] [place] [address] [phone]          Signed: Candelaria Celeste [name] 05/01/2011, 3:52 AM

## 2011-05-01 NOTE — Progress Notes (Signed)
Subjective: Patient feeling contractions approximately 8-10 minutes apart.  Sleeping.  Objective: BP 125/79  Pulse 94  Temp(Src) 98.2 F (36.8 C) (Oral)  Resp 20  Ht 5\' 3"  (1.6 m)  Wt 93.622 kg (206 lb 6.4 oz)  BMI 36.56 kg/m2  SpO2 96%      FHT:  FHR: 120s - 130s bpm, variability: moderate,  accelerations:  Present,  decelerations:  Absent UC:   irregular, every 8-10 minutes SVE:   Dilation: 3.5 Effacement (%): 60 Station: -2 Exam by:: dr Adrian Blackwater  Labs: Lab Results  Component Value Date   WBC 15.1* 05/01/2011   HGB 12.3 05/01/2011   HCT 35.9* 05/01/2011   MCV 88.6 05/01/2011   PLT 350 05/01/2011    Assessment / Plan: not in labor .  Discussed options with patient.  Will send her home.   Joanna [name] [name] 05/01/2011, 3:55 AM

## 2011-05-01 NOTE — Telephone Encounter (Signed)
Pt called emergency line to say that she was evaluated at [facility] hospital for contractions last night and was sent home because she was not yet in labor.  Per pt - now contractions every 2-3 minutes apart and are lasting 1 minute, more painful.  She states that this is more frequent and more pain than last night.  Pt tearful while on the phone with me.  [facility] and [facility] check.  Pt states understanding.

## 2011-05-01 NOTE — Progress Notes (Signed)
Pt reports having ctx since 10am q 3-4 min apart. deneis SROM or bleeding a t this time. reports good fetal movement

## 2011-05-01 NOTE — H&P (Addendum)
History     Chief Complaint  Patient presents with  . Contractions   HPI This is a 20 year old G3 P2 002 at 38 weeks and 0 days who is seen in the MAU for contractions that radiate to her back. Her contractions started at 9 PM have been progressing since that time. She currently rates the contraction pains at a 7/10. She is unsure of how close the contractions are together but subjectively feels that they are becoming closer. She denies decreased fetal activity, loss of fluid, vaginal bleeding, vaginal discharge, vision changes, headache, abdominal pain. She also had contractions that occurred last night and lasted until the morning.    OB History    Grav Para Term Preterm Abortions TAB SAB Ect Mult Living   3 2 2  0 0 0 0 0 0 2      Past Medical History  Diagnosis Date  . Asthma   . Urinary tract infection     Past Surgical History  Procedure Date  . No past surgeries     No family history on file.  History  Substance Use Topics  . Smoking status: Current Everyday Smoker -- 0.5 packs/day    Types: Cigarettes  . Smokeless tobacco: Never Used  . Alcohol Use: No    Allergies: No Known Allergies  Prescriptions prior to admission  Medication Sig Dispense Refill  . albuterol (PROVENTIL HFA;VENTOLIN HFA) 108 (90 BASE) MCG/ACT inhaler Inhale 2 puffs into the lungs as needed. For shortness of breath       . cephALEXin (KEFLEX) 500 MG capsule Take 500 mg by mouth daily.       . ferrous sulfate 325 (65 FE) MG tablet Take 325 mg by mouth daily with breakfast.        . predniSONE (DELTASONE) 20 MG tablet Take 2 tablets (40 mg total) by mouth once. Take once a day x 4 days.  8 tablet  0  . prenatal vitamin w/FE, FA (PRENATAL 1 + 1) 27-1 MG TABS Take 1 tablet by mouth daily.        Marland Kitchen azithromycin (ZITHROMAX Z-PAK) 250 MG tablet Take two days on day one, then one pill a day x 4 days.  6 each  0    Review of Systems  Constitutional: Negative for fever, chills and weight loss.    Eyes: Negative for blurred vision, double vision and photophobia.  Respiratory: Negative for cough, shortness of breath and wheezing.   Gastrointestinal: Negative for nausea, vomiting, abdominal pain, diarrhea and constipation.  Genitourinary: Negative for dysuria, urgency, frequency and hematuria.  Neurological: Negative for headaches.   Physical Exam   Blood pressure 137/73, pulse 110, temperature 98.6 F (37 C), temperature source Oral, resp. rate 22, height 5\' 3"  (1.6 m), weight 93.622 kg (206 lb 6.4 oz), SpO2 96.00%.  Physical Exam  Constitutional: She appears well-developed and well-nourished.  Cardiovascular: Normal rate, regular rhythm and normal heart sounds.  Exam reveals no gallop and no friction rub.   No murmur heard. Respiratory: Effort normal.       Wheezes heard in the right upper quadrant. There no rales. There's no increased work of breathing.  GI: Soft. Bowel sounds are normal. She exhibits no distension. There is no tenderness.       Fundal height measures 2 term gestational size. The fetus is vertex and orientation by [employer] and approximately 7 pounds estimated fetal weight by [employer].   Dilation: 4 Effacement (%): 60 Station: -3 Presentation: Vertex  Exam by:: Lucy Chris RNC  NST shows baseline rate in the 140s with accelerations present. There's moderate variability. There is no decelerations evident. Contractions approximately every 5 to 7 minutes.  Prenatal labs: ABO, Rh: A/POS/-- (03/30 1611) Antibody: NEG (03/30 1611) Rubella:   RPR: NON REAC (08/03 1518)  HBsAg: NEGATIVE (03/30 1611)  HIV: NON REACTIVE (08/03 1518)  GBS: Positive (09/12 0000)   MAU Course  Procedures    Assessment and Plan  1.  [id number] with IUP at 38 weeks 1 day 2.  GBS + 3.  Tobacco use  Patient has cervical change.  Will admit to birthing suits and continue with expectant management toward a vaginal delivery.  [facility] will be the pediatrician and the  patient will breastfeed.  She is planning on a Mirena IUD for contraception following the delivery.  Kensleigh Gates [name] 04/30/2011, 11:33 PM   PCP paged - no response. Candelaria Celeste [name] 05/01/2011

## 2011-05-03 ENCOUNTER — Inpatient Hospital Stay (HOSPITAL_COMMUNITY)
Admission: AD | Admit: 2011-05-03 | Discharge: 2011-05-05 | DRG: 775 | Disposition: A | Discharge status: Home / Self Care | Payer: Medicaid Other | Source: Ambulatory Visit | Attending: Obstetrics & Gynecology | Admitting: Obstetrics & Gynecology

## 2011-05-03 DIAGNOSIS — Z22330 Carrier of Group B streptococcus: Secondary | ICD-10-CM

## 2011-05-03 DIAGNOSIS — O99892 Other specified diseases and conditions complicating childbirth: Principal | ICD-10-CM | POA: Diagnosis present

## 2011-05-03 DIAGNOSIS — IMO0001 Reserved for inherently not codable concepts without codable children: Secondary | ICD-10-CM

## 2011-05-03 NOTE — Progress Notes (Signed)
SAYS HURT BAD  AT 2100-  VE 3.5 CM ON SAT.
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
## 2011-05-04 ENCOUNTER — Encounter (HOSPITAL_COMMUNITY): Payer: Self-pay | Admitting: Anesthesiology

## 2011-05-04 ENCOUNTER — Encounter (HOSPITAL_COMMUNITY): Payer: Self-pay | Admitting: *Deleted

## 2011-05-04 LAB — CBC
HCT: 36.9 % (ref 36.0–46.0)
Hemoglobin: 12.8 g/dL (ref 12.0–15.0)
MCH: 31 pg (ref 26.0–34.0)
MCV: 89.3 fL (ref 78.0–100.0)
RBC: 4.13 MIL/uL (ref 3.87–5.11)

## 2011-05-04 MED ORDER — FENTANYL 2.5 MCG/ML BUPIVACAINE 1/10 % EPIDURAL INFUSION (WH - ANES): 14.0000 mL/h | INTRAMUSCULAR | Status: DC

## 2011-05-04 MED ORDER — DIPHENHYDRAMINE HCL 25 MG PO CAPS: 25.0000 mg | ORAL_CAPSULE | Freq: Four times a day (QID) | ORAL | Status: DC | PRN

## 2011-05-04 MED ORDER — OXYTOCIN 20 UNITS IN LACTATED RINGERS INFUSION - SIMPLE
INTRAVENOUS | Status: AC
  Filled 2011-05-04: qty 1000

## 2011-05-04 MED ORDER — LACTATED RINGERS IV SOLN: 500.0000 mL | Freq: Once | INTRAVENOUS | Status: DC

## 2011-05-04 MED ORDER — PNEUMOCOCCAL VAC POLYVALENT 25 MCG/0.5ML IJ INJ
0.5000 mL | INJECTION | INTRAMUSCULAR | Status: AC
  Administered 2011-05-05: 0.5 mL via INTRAMUSCULAR
  Filled 2011-05-04: qty 0.5

## 2011-05-04 MED ORDER — DIBUCAINE 1 % RE OINT: 1.0000 "application " | TOPICAL_OINTMENT | RECTAL | Status: DC | PRN

## 2011-05-04 MED ORDER — IBUPROFEN 600 MG PO TABS
600.0000 mg | ORAL_TABLET | Freq: Four times a day (QID) | ORAL | Status: DC
  Administered 2011-05-04 – 2011-05-05 (×4): 600 mg via ORAL
  Filled 2011-05-04 (×2): qty 1

## 2011-05-04 MED ORDER — OXYTOCIN 20 UNITS IN LACTATED RINGERS INFUSION - SIMPLE: 125.0000 mL/h | Freq: Once | INTRAVENOUS | Status: DC

## 2011-05-04 MED ORDER — EPHEDRINE 5 MG/ML INJ
10.0000 mg | INTRAVENOUS | Status: DC | PRN
  Filled 2011-05-04: qty 4

## 2011-05-04 MED ORDER — EPHEDRINE 5 MG/ML INJ
INTRAVENOUS | Status: AC
  Filled 2011-05-04: qty 4

## 2011-05-04 MED ORDER — LACTATED RINGERS IV SOLN
INTRAVENOUS | Status: DC
  Administered 2011-05-04: 01:00:00 via INTRAVENOUS

## 2011-05-04 MED ORDER — ONDANSETRON HCL 4 MG PO TABS: 4.0000 mg | ORAL_TABLET | ORAL | Status: DC | PRN

## 2011-05-04 MED ORDER — SENNOSIDES-DOCUSATE SODIUM 8.6-50 MG PO TABS: 2.0000 | ORAL_TABLET | Freq: Every day | ORAL | Status: DC

## 2011-05-04 MED ORDER — CITRIC ACID-SODIUM CITRATE 334-500 MG/5ML PO SOLN: 30.0000 mL | ORAL | Status: DC | PRN

## 2011-05-04 MED ORDER — OXYTOCIN BOLUS FROM INFUSION
500.0000 mL | Freq: Once | INTRAVENOUS | Status: AC
  Administered 2011-05-04: 500 mL via INTRAVENOUS
  Filled 2011-05-04: qty 500

## 2011-05-04 MED ORDER — LIDOCAINE HCL (PF) 1 % IJ SOLN
30.0000 mL | INTRAMUSCULAR | Status: DC | PRN
  Filled 2011-05-04: qty 30

## 2011-05-04 MED ORDER — ONDANSETRON HCL 4 MG/2ML IJ SOLN: 4.0000 mg | Freq: Four times a day (QID) | INTRAMUSCULAR | Status: DC | PRN

## 2011-05-04 MED ORDER — IBUPROFEN 600 MG PO TABS
600.0000 mg | ORAL_TABLET | Freq: Four times a day (QID) | ORAL | Status: DC | PRN
  Administered 2011-05-04: 600 mg via ORAL
  Filled 2011-05-04 (×3): qty 1

## 2011-05-04 MED ORDER — PHENYLEPHRINE 40 MCG/ML (10ML) SYRINGE FOR IV PUSH (FOR BLOOD PRESSURE SUPPORT)
80.0000 ug | PREFILLED_SYRINGE | INTRAVENOUS | Status: DC | PRN
  Filled 2011-05-04: qty 5

## 2011-05-04 MED ORDER — BENZOCAINE-MENTHOL 20-0.5 % EX AERO: 1.0000 "application " | INHALATION_SPRAY | CUTANEOUS | Status: DC | PRN

## 2011-05-04 MED ORDER — PHENYLEPHRINE 40 MCG/ML (10ML) SYRINGE FOR IV PUSH (FOR BLOOD PRESSURE SUPPORT)
PREFILLED_SYRINGE | INTRAVENOUS | Status: AC
  Filled 2011-05-04: qty 5

## 2011-05-04 MED ORDER — FENTANYL 2.5 MCG/ML BUPIVACAINE 1/10 % EPIDURAL INFUSION (WH - ANES)
INTRAMUSCULAR | Status: AC
  Filled 2011-05-04: qty 60

## 2011-05-04 MED ORDER — ACETAMINOPHEN 325 MG PO TABS: 650.0000 mg | ORAL_TABLET | ORAL | Status: DC | PRN

## 2011-05-04 MED ORDER — ALBUTEROL SULFATE HFA 108 (90 BASE) MCG/ACT IN AERS
2.0000 | INHALATION_SPRAY | RESPIRATORY_TRACT | Status: DC | PRN
  Filled 2011-05-04: qty 6.7

## 2011-05-04 MED ORDER — TETANUS-DIPHTH-ACELL PERTUSSIS 5-2.5-18.5 LF-MCG/0.5 IM SUSP
0.5000 mL | Freq: Once | INTRAMUSCULAR | Status: AC
  Administered 2011-05-05: 0.5 mL via INTRAMUSCULAR
  Filled 2011-05-04: qty 0.5

## 2011-05-04 MED ORDER — LANOLIN HYDROUS EX OINT: TOPICAL_OINTMENT | CUTANEOUS | Status: DC | PRN

## 2011-05-04 MED ORDER — SODIUM CHLORIDE 0.9 % IV SOLN
2.0000 g | Freq: Once | INTRAVENOUS | Status: AC
  Administered 2011-05-04: 2 g via INTRAVENOUS
  Filled 2011-05-04: qty 2000

## 2011-05-04 MED ORDER — ZOLPIDEM TARTRATE 5 MG PO TABS: 5.0000 mg | ORAL_TABLET | Freq: Every evening | ORAL | Status: DC | PRN

## 2011-05-04 MED ORDER — PRENATAL PLUS 27-1 MG PO TABS
1.0000 | ORAL_TABLET | Freq: Every day | ORAL | Status: DC
  Administered 2011-05-04 – 2011-05-05 (×2): 1 via ORAL
  Filled 2011-05-04 (×2): qty 1

## 2011-05-04 MED ORDER — LIDOCAINE HCL (PF) 1 % IJ SOLN
INTRAMUSCULAR | Status: AC
  Filled 2011-05-04: qty 30

## 2011-05-04 MED ORDER — LACTATED RINGERS IV SOLN: 500.0000 mL | INTRAVENOUS | Status: DC | PRN

## 2011-05-04 MED ORDER — SIMETHICONE 80 MG PO CHEW: 80.0000 mg | CHEWABLE_TABLET | ORAL | Status: DC | PRN

## 2011-05-04 MED ORDER — DIPHENHYDRAMINE HCL 50 MG/ML IJ SOLN: 12.5000 mg | INTRAMUSCULAR | Status: DC | PRN

## 2011-05-04 MED ORDER — OXYCODONE-ACETAMINOPHEN 5-325 MG PO TABS: 1.0000 | ORAL_TABLET | ORAL | Status: DC | PRN

## 2011-05-04 MED ORDER — FLEET ENEMA 7-19 GM/118ML RE ENEM: 1.0000 | ENEMA | RECTAL | Status: DC | PRN

## 2011-05-04 MED ORDER — ONDANSETRON HCL 4 MG/2ML IJ SOLN: 4.0000 mg | INTRAMUSCULAR | Status: DC | PRN

## 2011-05-04 MED ORDER — OXYCODONE-ACETAMINOPHEN 5-325 MG PO TABS: 2.0000 | ORAL_TABLET | ORAL | Status: DC | PRN

## 2011-05-04 MED ORDER — WITCH HAZEL-GLYCERIN EX PADS: 1.0000 "application " | MEDICATED_PAD | CUTANEOUS | Status: DC | PRN

## 2011-05-04 NOTE — Progress Notes (Signed)
SVD of viable female infant; see delivery record

## 2011-05-04 NOTE — Anesthesia Postprocedure Evaluation (Signed)
  Anesthesia Post-op Note  Patient: Joanna [name]  Procedure(s) PerformedCLE  Patient Location: PACU and Mother/Baby  Anesthesia Type: Epidural  Level of Consciousness: awake  Airway and Oxygen Therapy: Patient Spontanous Breathing  Post-op Pain: none  Post-op Assessment: Post-op Vital signs reviewed  Post-op Vital Signs: Reviewed and stable  Complications: No apparent anesthesia complications

## 2011-05-04 NOTE — H&P (Signed)
Joanna [name] is a 20 y.o. female presenting for reg ctx. Denies leak or bldg today. Reports +FM. Maternal Medical History:  Reason for admission: Reason for Admission:   nausea  OB History    Grav Para Term Preterm Abortions TAB SAB Ect Mult Living   3 2 2  0 0 0 0 0 0 2     Past Medical History  Diagnosis Date  . Asthma   . Urinary tract infection    Past Surgical History  Procedure Date  . No past surgeries    Family History: family history is not on file. Social History:  reports that she has been smoking Cigarettes.  She has been smoking about .5 packs per day. She has never used smokeless tobacco. She reports that she does not drink alcohol or use illicit drugs.  Review of Systems  Constitutional: Negative for fever.  Gastrointestinal: Negative for nausea and vomiting.      Blood pressure 130/79, pulse 100, temperature 98.4 F (36.9 C), temperature source Oral, height 5\' 3"  (1.6 m), weight 91.23 kg (201 lb 2 oz). Maternal Exam:  Uterine Assessment: Contraction strength is moderate.  Contraction frequency is regular.   Cervix: cx change from 3cm to 5-6/90/-2 after one hour  Fetal Exam Fetal Monitor Review: Baseline rate: 125.  Variability: moderate (6-25 bpm).   Pattern: accelerations present and no decelerations.    Fetal State Assessment: Category I - tracings are normal.     Physical Exam  Constitutional: She appears well-developed and well-nourished.  HENT:  Head: Normocephalic.  Cardiovascular: Normal rate.   Respiratory: Effort normal.  Musculoskeletal: Normal range of motion.  Skin: Skin is warm and dry.  Psychiatric: She has a normal mood and affect. Her behavior is normal.    Prenatal labs: ABO, Rh: A/POS/-- (03/30 1611) Antibody: NEG (03/30 1611) Rubella: 33.5 (03/30 1611) RPR: NON REACTIVE (10/14 0057)  HBsAg: NEGATIVE (03/30 1611)  HIV: NON REACTIVE (08/03 1518)  GBS: Positive (09/12 0000)   Assessment/Plan: IUP at term Active  labor GBS +  Will admit to L&D Amp for GBS treatment Epid for pain control per pt request   SHAW, KIMBERLY 05/04/2011, 12:05 AM

## 2011-05-04 NOTE — Progress Notes (Signed)
Epidural catheter removed, tip intact. EPIC down during epidural insertion by anesthesiologist; downtime paper charting used by Dr. Malen Gauze. Epidural removal documented on paper form filled out by anesthesia.

## 2011-05-04 NOTE — Progress Notes (Signed)
UR chart review completed.  

## 2011-05-05 ENCOUNTER — Encounter: Payer: Medicaid Other | Admitting: Family Medicine

## 2011-05-05 NOTE — Discharge Summary (Signed)
Obstetric Discharge Summary Reason for Admission: onset of labor Prenatal Procedures: ultrasound Intrapartum Procedures: spontaneous vaginal delivery and GBS prophylaxis Postpartum Procedures: none Complications-Operative and Postpartum: none Hemoglobin  Date Value Range Status  05/04/2011 12.8  12.0-15.0 (g/dL) Final     HCT  Date Value Range Status  05/04/2011 36.9  36.0-46.0 (%) Final    Discharge Diagnoses: Term Pregnancy-delivered  Discharge Information: Date: 05/05/2011 Activity: pelvic rest Diet: routine Medications: PNV and Iron Condition: stable Instructions: refer to practice specific booklet Discharge to: home Follow-up Information    Follow up with [facility], [place] in 6 weeks. (Post Partum Visit)    Contact information:   [address] [place] [address] [phone]          Newborn Data: Live born female  Birth Weight: 6 lb 14.4 oz (3130 g) APGAR: 9, 9  Home with mother.  Joanna [name] 05/05/2011, 8:07 AM

## 2011-05-05 NOTE — Progress Notes (Signed)
Post Partum Day # 1 Subjective: no complaints, up ad lib, voiding, tolerating PO and + flatus, breast feeding without difficulty.   Objective: Blood pressure 118/77, pulse 84, temperature 98.2 F (36.8 C), temperature source Oral, resp. rate 20, height 5\' 3"  (1.6 m), weight 201 lb 2 oz (91.23 kg), SpO2 96.00%, unknown if currently breastfeeding.  Physical Exam:  General: alert, cooperative and no distress Lochia: appropriate Uterine Fundus: firm DVT Evaluation: No evidence of DVT seen on physical exam.   Basename 05/04/11 0125  HGB 12.8  HCT 36.9    Assessment/Plan: Discharge home, Breastfeeding and Contraception Implanon at postpartum visit.    LOS: 2 days   Cathan Gearin 05/05/2011, 7:32 AM

## 2011-05-06 NOTE — Anesthesia Preprocedure Evaluation (Addendum)
Anesthesia Evaluation Anesthesia Physical Anesthesia Plan  ASA: II  Anesthesia Plan: Epidural   Post-op Pain Management:    Induction:   Airway Management Planned:   Additional Equipment:   Intra-op Plan:   Post-operative Plan:   Informed Consent:   Plan Discussed with:   Anesthesia Plan Comments: (PreOp note done on another encounter this admission. See that note for details. Post op note done on Encounter without being linked to epidural ])       Anesthesia Quick Evaluation

## 2011-05-14 NOTE — Anesthesia Procedure Notes (Signed)
Procedures

## 2011-05-29 ENCOUNTER — Emergency Department (INDEPENDENT_AMBULATORY_CARE_PROVIDER_SITE_OTHER)
Admission: EM | Admit: 2011-05-29 | Discharge: 2011-05-29 | Disposition: A | Discharge status: Home / Self Care | Payer: Medicaid Other | Source: Home / Self Care | Attending: Family Medicine | Admitting: Family Medicine

## 2011-05-29 ENCOUNTER — Encounter (HOSPITAL_COMMUNITY): Payer: Self-pay | Admitting: *Deleted

## 2011-05-29 DIAGNOSIS — L03119 Cellulitis of unspecified part of limb: Secondary | ICD-10-CM

## 2011-05-29 DIAGNOSIS — L02419 Cutaneous abscess of limb, unspecified: Secondary | ICD-10-CM

## 2011-05-29 MED ORDER — SULFAMETHOXAZOLE-TRIMETHOPRIM 800-160 MG PO TABS: 1.0000 | ORAL_TABLET | Freq: Two times a day (BID) | ORAL | Status: DC

## 2011-05-29 MED ORDER — SULFAMETHOXAZOLE-TRIMETHOPRIM 800-160 MG PO TABS: 1.0000 | ORAL_TABLET | Freq: Two times a day (BID) | ORAL | Status: AC

## 2011-05-29 NOTE — ED Provider Notes (Signed)
History     CSN: [id number] Arrival date & time: 05/29/2011  3:56 PM   First MD Initiated Contact with Patient 05/29/11 1507      Chief Complaint  Patient presents with  . Skin Problem    pt with 2 small ? insect bites/infection right lower leg - painful sore to palpation    (Consider location/radiation/quality/duration/timing/severity/associated sxs/prior treatment) Patient is a 20 y.o. female presenting with rash.  Rash  This is a new problem. The current episode started more than 2 days ago. The problem has not changed since onset.The problem is associated with an unknown factor. There has been no fever. The rash is present on the right lower leg. The pain is moderate. The pain has been constant since onset. Associated symptoms include pain. Pertinent negatives include no blisters and no weeping.    Past Medical History  Diagnosis Date  . Asthma   . Urinary tract infection     Past Surgical History  Procedure Date  . No past surgeries     History reviewed. No pertinent family history.  History  Substance Use Topics  . Smoking status: Current Everyday Smoker -- 0.5 packs/day    Types: Cigarettes  . Smokeless tobacco: Never Used  . Alcohol Use: No    OB History    Grav Para Term Preterm Abortions TAB SAB Ect Mult Living   3 3 3  0 0 0 0 0 0 3      Review of Systems  Constitutional: Negative.   HENT: Negative.   Eyes: Negative.   Respiratory: Negative.   Gastrointestinal: Negative.   Skin: Positive for rash.       2 erythematous, indurated, painful lesions over lateral RIGHT lower leg  Neurological: Negative.     Allergies  Review of patient's allergies indicates no known allergies.  Home Medications   Current Outpatient Rx  Name Route Sig Dispense Refill  . ALBUTEROL SULFATE HFA 108 (90 BASE) MCG/ACT IN AERS Inhalation Inhale 2 puffs into the lungs as needed. For shortness of breath     . FERROUS SULFATE 325 (65 FE) MG PO TABS Oral Take 325 mg by  mouth daily with breakfast.      . PRENATAL PLUS 27-1 MG PO TABS Oral Take 1 tablet by mouth daily.        BP 113/80  Pulse 83  Temp(Src) 98.2 F (36.8 C) (Oral)  Resp 20  SpO2 99%  Breastfeeding? No  Physical Exam  Constitutional: She is oriented to person, place, and time. She appears well-developed and well-nourished.  Eyes: EOM are normal.  Neurological: She is alert and oriented to person, place, and time.  Skin: Lesion and rash noted. Rash is macular. There is erythema.       ED Course  Procedures (including critical care time)  Labs Reviewed - No data to display No results found.   No diagnosis found.    MDM          Richardo Priest, MD 05/29/11 1700

## 2011-06-13 ENCOUNTER — Ambulatory Visit: Payer: Medicaid Other | Admitting: Family Medicine

## 2011-06-15 ENCOUNTER — Telehealth: Payer: Self-pay | Admitting: Family Medicine

## 2011-06-15 NOTE — Telephone Encounter (Signed)
Pt called to move up her appt for 6 wk pp, she was scheduled on 12/21, moved her to 12/3 this coming Monday, pt is asking to speak with RN b/c she delivered her baby on 10/17 and she hasn't had a menstral cycle since.

## 2011-06-15 NOTE — Telephone Encounter (Signed)
Returned call to patient.  States she has not had her period since having baby in October.  Not breastfeeding or taking any birth control.  Admits to having unprotected intercourse last week and wants to know if we can call in Rx for emergency contraceptive pill.  Patient informed that medication available OTC & does not need Rx.  Must take it within 72 hours of unprotected intercourse.  Patient verbalized understanding.  May not have started period yet due to hormones adjusting.  Can discuss at office visit next week. Will route to PCP for any additional info and call patient back.

## 2011-06-20 ENCOUNTER — Ambulatory Visit (INDEPENDENT_AMBULATORY_CARE_PROVIDER_SITE_OTHER): Payer: Medicaid Other | Admitting: Family Medicine

## 2011-06-20 ENCOUNTER — Telehealth: Payer: Self-pay | Admitting: Psychology

## 2011-06-20 ENCOUNTER — Encounter: Payer: Self-pay | Admitting: Family Medicine

## 2011-06-20 DIAGNOSIS — Z3480 Encounter for supervision of other normal pregnancy, unspecified trimester: Secondary | ICD-10-CM

## 2011-06-20 DIAGNOSIS — Z8659 Personal history of other mental and behavioral disorders: Secondary | ICD-10-CM

## 2011-06-20 DIAGNOSIS — F17200 Nicotine dependence, unspecified, uncomplicated: Secondary | ICD-10-CM

## 2011-06-20 DIAGNOSIS — Z309 Encounter for contraceptive management, unspecified: Secondary | ICD-10-CM

## 2011-06-20 MED ORDER — LEVONORGESTREL 20 MCG/24HR IU IUD: 1.0000 | INTRAUTERINE_SYSTEM | Freq: Once | INTRAUTERINE | Status: DC

## 2011-06-20 MED ORDER — LEVONORGESTREL 20 MCG/24HR IU IUD
INTRAUTERINE_SYSTEM | Freq: Once | INTRAUTERINE | Status: AC
  Administered 2011-06-20: 1 via INTRAUTERINE

## 2011-06-20 NOTE — Telephone Encounter (Signed)
Meta called back to say there was no answer at the [facility] number.  I called and got a voice recording but did not stay on the line to see if someone eventually picked up.  Talked with Joanna [name] about it.  We scheduled for January 16th at 9:30.  Encouraged her to try [facility] again to see if she could get an earlier appt there.  If so, she can call to cancel this one.  As it stands now, she is scheduled with [place].  I told her about the nature of the [facility] clinic, specifically, who would be present and that it would focus on her medications.  If she is unable to attend the appt, she agreed she would call and stated an understanding that I would have difficulty rescheduling her in that clinic if she did not show.

## 2011-06-20 NOTE — Telephone Encounter (Signed)
Patient called to schedule an appointment.  Reviewed note from Dr. Lula Olszewski.  First available for new [facility] appt is January 16th.  Discussed with patient including other options  She elected to take the phone number for [facility] to see if she could get in there sooner.  I asked her to call back if she wished to schedule with [place].  Will forward note to Dr. Lula Olszewski, her PCP.

## 2011-06-20 NOTE — Assessment & Plan Note (Signed)
Smoking has increased since delivery, pt now smoking about 1 ppd.  She has no interest in quitting.

## 2011-06-20 NOTE — Progress Notes (Signed)
IUD Insertion Procedure Note  Pre-operative Diagnosis: Contraceptive management  Post-operative Diagnosis: same  Indications: contraception  Procedure Details  Urine pregnancy test was done today and result was negative.  The risks (including infection, bleeding, pain, and uterine perforation) and benefits of the procedure were explained to the patient and Written informed consent was obtained.    Cervix cleansed with Betadine. Uterus sounded to 7 cm. IUD inserted without difficulty. String visible and trimmed. Patient tolerated procedure well.  IUD Information: Mirena.  Condition: Stable  Complications: None  Plan:  The patient was advised to call for any fever or for prolonged or severe pain or bleeding. She was advised to use NSAID as needed for mild to moderate pain.

## 2011-06-20 NOTE — Assessment & Plan Note (Addendum)
Bottle feeding, pt reports she is doing well.  Mirena placed today for contraception.

## 2011-06-20 NOTE — Patient Instructions (Signed)
It was good to see you.  Your Mirena is in place, and is good for birth control for 5 years.  Remember it does not protect against STD's.  Please check for the strings once a month, and schedule an appointment if you do not feel them.  You can take some ibuprofen when you get home if you are feeling some cramping.   For your bipolar depression, I want you to call Dr. Pascal Lux, our Psychologist that is in our office.  She will likely want to schedule you in [facility].  If you are having symptoms that you are manic or depressed, please call the office and ask for an appointment or seek medical care.

## 2011-06-20 NOTE — Assessment & Plan Note (Signed)
Unclear of pt's exact diagnosis.  She has not mentioned bipolar in the past (during entire pregnancy).  Will ask her to contact Dr. Pascal Lux for Mood disorder clinic for evaluation.  At this time she denies any depressive or manic symptoms.

## 2011-06-20 NOTE — Progress Notes (Signed)
  Subjective:    Patient ID: Joanna [name], female    DOB: Dec 13, 1990, 20 y.o.   MRN: [id number]  HPI  Kimmy comes in for post-partum check up.  She says she is doing well overall, has not major complaints.  Her period started over the weekend, it is the first period she has had since the baby was born.  She is bottle feeding the baby. Post partum depression screen negative.   Patient states she would like to be on birth control pills, however she is now smoking 1ppd.  Also, she has become pregnant on birth control pills in the past.  After discussion about risks of smoking and oral contraceptives, patient decides on Mirena for contraception.   She says that before she was pregnant she was taking some medications for bipolar and depression.  This was prescribed down in [place].  She denies any depressive or manic symptoms today, but thinks she needs to be back on those medications.  She thinks she was on Lamictal and Trazodone, but cannot remember for sure.   Review of Systems Pertinent items noted in HPI.     Objective:   Physical Exam BP 118/77  Pulse 75  Temp(Src) 97.7 F (36.5 C) (Oral)  Ht 5\' 3"  (1.6 m)  Wt 189 lb (85.73 kg)  BMI 33.48 kg/m2 General appearance: alert, cooperative and no distress Lungs: clear to auscultation bilaterally Heart: regular rate and rhythm, S1, S2 normal, no murmur, click, rub or gallop Abdomen: soft, non-tender; bowel sounds normal; no masses,  no organomegaly Pelvic: cervix normal in appearance, external genitalia normal, no adnexal masses or tenderness, no cervical motion tenderness, rectovaginal septum normal, uterus normal size, shape, and consistency, vagina normal without discharge and blood in cervical os and vaginal vault.  Extremities: extremities normal, atraumatic, no cyanosis or edema       Assessment & Plan:

## 2011-06-27 NOTE — Telephone Encounter (Signed)
Joanna [name] called and left VM canceling her January 16th appointment.  She stated she has found care elsewhere.  Will alert PCP.

## 2011-07-01 NOTE — Telephone Encounter (Signed)
Patient seen on 12/3 for contraceptive management, closing encounter.

## 2011-07-08 ENCOUNTER — Ambulatory Visit: Payer: Medicaid Other | Admitting: Family Medicine

## 2012-04-29 ENCOUNTER — Encounter (HOSPITAL_COMMUNITY): Payer: Self-pay | Admitting: Emergency Medicine

## 2012-04-29 ENCOUNTER — Emergency Department (INDEPENDENT_AMBULATORY_CARE_PROVIDER_SITE_OTHER)
Admission: EM | Admit: 2012-04-29 | Discharge: 2012-04-29 | Disposition: A | Discharge status: Home / Self Care | Payer: Medicaid Other | Source: Home / Self Care

## 2012-04-29 DIAGNOSIS — H00023 Hordeolum internum right eye, unspecified eyelid: Secondary | ICD-10-CM

## 2012-04-29 DIAGNOSIS — H00029 Hordeolum internum unspecified eye, unspecified eyelid: Secondary | ICD-10-CM

## 2012-04-29 MED ORDER — TOBRAMYCIN 0.3 % OP OINT: TOPICAL_OINTMENT | Freq: Three times a day (TID) | OPHTHALMIC | Status: DC

## 2012-04-29 NOTE — ED Provider Notes (Signed)
History     CSN: [id number]  Arrival date & time 04/29/12  1158   None     Chief Complaint  Patient presents with  . Stye    (Consider location/radiation/quality/duration/timing/severity/associated sxs/prior treatment) The history is provided by the patient.  Patient reports right eyelid pain and "knot" for 2 days.  states has applied warm compresses and stye ointment from sister with not improvement.  Denies previous history of same.  Not diabetic, no previous eye surgery/trauma, denies cataracts or glaucoma. No floaters or flashing lights No vision loss + blurred vision + eye pain + eyelid itching + tearing No headache/scalp tenderness Does not wear contacts or glasses  Past Medical History  Diagnosis Date  . Asthma   . Urinary tract infection     Pyelonephritis during pregnancy  . 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 No past surgeries     History reviewed. No pertinent family history.  History  Substance Use Topics  . Smoking status: Current Every Day Smoker -- 1.0 packs/day    Types: Cigarettes  . Smokeless tobacco: Never Used  . Alcohol Use: No    OB History    Grav Para Term Preterm Abortions TAB SAB Ect Mult Living   3 3 3  0 0 0 0 0 0 3      Review of Systems  All other systems reviewed and are negative.    Allergies  Review of patient's allergies indicates no known allergies.  Home Medications   Current Outpatient Rx  Name Route Sig Dispense Refill  . ALBUTEROL SULFATE HFA 108 (90 BASE) MCG/ACT IN AERS Inhalation Inhale 2 puffs into the lungs as needed. For shortness of breath     . FERROUS SULFATE 325 (65 FE) MG PO TABS Oral Take 325 mg by mouth daily with breakfast.      . LEVONORGESTREL 20 MCG/24HR IU IUD Intrauterine 1 each by Intrauterine route once. 1 each 0  . PRENATAL PLUS 27-1 MG PO TABS Oral Take 1 tablet by mouth daily.      . TOBRAMYCIN SULFATE 0.3 % OP OINT Right Eye Place into the right eye 3 (three)  times daily. For 5-7 days. 3.5 g 0    BP 127/79  Pulse 90  Temp 98.8 F (37.1 C) (Oral)  Resp 18  SpO2 100%  Physical Exam  Nursing note and vitals reviewed. Constitutional: She is oriented to person, place, and time. Vital signs are normal. She appears well-developed and well-nourished. She is active and cooperative.  HENT:  Head: Normocephalic.  Eyes: Conjunctivae normal and EOM are normal. Pupils are equal, round, and reactive to light. Right eye exhibits hordeolum. Right eye exhibits no discharge and no exudate. No foreign body present in the right eye. Left eye exhibits no discharge, no exudate and no hordeolum. No foreign body present in the left eye. No scleral icterus.    Neck: Trachea normal. Neck supple.  Cardiovascular: Normal rate, regular rhythm and normal heart sounds.   Pulmonary/Chest: Effort normal and breath sounds normal.  Lymphadenopathy:       Head (right side): No submental, no submandibular, no tonsillar, no preauricular, no posterior auricular and no occipital adenopathy present.       Head (left side): No submental, no submandibular, no tonsillar, no preauricular, no posterior auricular and no occipital adenopathy present.    She has no cervical adenopathy.  Neurological: She is alert and oriented to person, place, and time. No  cranial nerve deficit or sensory deficit.  Skin: Skin is warm and dry.  Psychiatric: She has a normal mood and affect. Her speech is normal and behavior is normal. Judgment and thought content normal. Cognition and memory are normal.    ED Course  Procedures (including critical care time)  Labs Reviewed - No data to display No results found.   1. Hordeolum internum of right eye       MDM  Cleanse eyelid with baby shampoo.  Apply warm compresses four times daily. Discontinue the use of eye makeup as well as eye lotions and creams because they may be infected, obtain new.Seek follow-up care with an ophthalmologist within 1-2  weeks if the condition is not resolved completely with prescribed management.        Johnsie Kindred, NP 04/29/12 1242

## 2012-04-29 NOTE — ED Provider Notes (Signed)
Medical screening examination/treatment/procedure(s) were performed by resident physician or non-physician practitioner and as supervising physician I was immediately available for consultation/collaboration.   Caydence Koenig DOUGLAS MD.    Ronold Hardgrove D Yuriana Gaal, MD 04/29/12 1553 

## 2012-04-29 NOTE — ED Notes (Addendum)
Pt c/o stye on right eye.  Some drainage from right eye and irritation/pain. Pt states that she has been using warm compresses and the stye comes and goes and at times will appear on the left side also.  Pt did use some stye ointment earlier in the week but caused eye to become even more irritated.

## 2012-05-11 ENCOUNTER — Emergency Department (HOSPITAL_COMMUNITY): Payer: Medicaid Other

## 2012-05-11 ENCOUNTER — Emergency Department (HOSPITAL_COMMUNITY)
Admission: EM | Admit: 2012-05-11 | Discharge: 2012-05-11 | Disposition: A | Discharge status: Home / Self Care | Payer: Medicaid Other | Attending: Emergency Medicine | Admitting: Emergency Medicine

## 2012-05-11 DIAGNOSIS — Z79899 Other long term (current) drug therapy: Secondary | ICD-10-CM | POA: Insufficient documentation

## 2012-05-11 DIAGNOSIS — J029 Acute pharyngitis, unspecified: Secondary | ICD-10-CM | POA: Insufficient documentation

## 2012-05-11 DIAGNOSIS — F17200 Nicotine dependence, unspecified, uncomplicated: Secondary | ICD-10-CM | POA: Insufficient documentation

## 2012-05-11 DIAGNOSIS — J45909 Unspecified asthma, uncomplicated: Secondary | ICD-10-CM | POA: Insufficient documentation

## 2012-05-11 DIAGNOSIS — N390 Urinary tract infection, site not specified: Secondary | ICD-10-CM | POA: Insufficient documentation

## 2012-05-11 DIAGNOSIS — Z22330 Carrier of Group B streptococcus: Secondary | ICD-10-CM | POA: Insufficient documentation

## 2012-05-11 MED ORDER — DEXAMETHASONE SODIUM PHOSPHATE 10 MG/ML IJ SOLN
10.0000 mg | Freq: Once | INTRAMUSCULAR | Status: AC
  Administered 2012-05-11: 10 mg via INTRAMUSCULAR
  Filled 2012-05-11: qty 1

## 2012-05-11 MED ORDER — PENICILLIN G BENZATHINE 1200000 UNIT/2ML IM SUSP
1.2000 10*6.[IU] | Freq: Once | INTRAMUSCULAR | Status: AC
  Administered 2012-05-11: 1.2 10*6.[IU] via INTRAMUSCULAR
  Filled 2012-05-11: qty 2

## 2012-05-11 NOTE — ED Provider Notes (Signed)
History     CSN: [id number]  Arrival date & time 05/11/12  0301   First MD Initiated Contact with Patient 05/11/12 [phone]      No chief complaint on file.   (Consider location/radiation/quality/duration/timing/severity/associated sxs/prior treatment) Patient is a 21 y.o. female presenting with pharyngitis.  Sore Throat This is a new problem. The current episode started yesterday. The problem occurs constantly. The problem has not changed since onset.Pertinent negatives include no chest pain, no abdominal pain, no headaches and no shortness of breath. Nothing aggravates the symptoms. Nothing relieves the symptoms. She has tried nothing for the symptoms. The treatment provided no relief.    Past Medical History  Diagnosis Date  . Asthma   . Urinary tract infection     Pyelonephritis during pregnancy  . 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 No past surgeries     No family history on file.  History  Substance Use Topics  . Smoking status: Current Every Day Smoker -- 1.0 packs/day    Types: Cigarettes  . Smokeless tobacco: Never Used  . Alcohol Use: No    OB History    Grav Para Term Preterm Abortions TAB SAB Ect Mult Living   3 3 3  0 0 0 0 0 0 3      Review of Systems  Respiratory: Negative for shortness of breath.   Cardiovascular: Negative for chest pain.  Gastrointestinal: Negative for abdominal pain.  Neurological: Negative for headaches.  All other systems reviewed and are negative.    Allergies  Review of patient's allergies indicates no known allergies.  Home Medications   Current Outpatient Rx  Name Route Sig Dispense Refill  . ALBUTEROL SULFATE HFA 108 (90 BASE) MCG/ACT IN AERS Inhalation Inhale 2 puffs into the lungs as needed. For shortness of breath     . FERROUS SULFATE 325 (65 FE) MG PO TABS Oral Take 325 mg by mouth daily with breakfast.      . LEVONORGESTREL 20 MCG/24HR IU IUD Intrauterine 1 each by Intrauterine route  once. 1 each 0  . PRENATAL PLUS 27-1 MG PO TABS Oral Take 1 tablet by mouth daily.      . TOBRAMYCIN SULFATE 0.3 % OP OINT Right Eye Place into the right eye 3 (three) times daily. For 5-7 days. 3.5 g 0    BP 128/74  Pulse 98  Temp 99.2 F (37.3 C) (Oral)  Resp 18  SpO2 95%  Physical Exam  Constitutional: She is oriented to person, place, and time. She appears well-developed and well-nourished.  HENT:  Head: Normocephalic and atraumatic.  Mouth/Throat: Oropharyngeal exudate and posterior oropharyngeal erythema present.       No uvular deviation,  No kissing or asymmetric tonsils  Eyes: Conjunctivae normal and EOM are normal. Pupils are equal, round, and reactive to light.  Neck: Normal range of motion.  Cardiovascular: Normal rate, regular rhythm and normal heart sounds.   Pulmonary/Chest: Effort normal and breath sounds normal.  Abdominal: Soft. Bowel sounds are normal.  Musculoskeletal: Normal range of motion.  Neurological: She is alert and oriented to person, place, and time.  Skin: Skin is warm and dry.  Psychiatric: She has a normal mood and affect. Her behavior is normal.    ED Course  Procedures (including critical care time)  Labs Reviewed - No data to display No results found.   No diagnosis found.    MDM  + pharyngitis, will xray,  Abx,  Steroids,  reassess        Rosanne Ashing, MD 05/11/12 [phone]

## 2012-12-16 ENCOUNTER — Encounter (HOSPITAL_COMMUNITY): Payer: Self-pay | Admitting: Emergency Medicine

## 2012-12-16 ENCOUNTER — Emergency Department (HOSPITAL_COMMUNITY)
Admission: EM | Admit: 2012-12-16 | Discharge: 2012-12-16 | Discharge status: Against Medical Advice | Payer: Medicaid Other | Attending: Emergency Medicine | Admitting: Emergency Medicine

## 2012-12-16 DIAGNOSIS — Z3202 Encounter for pregnancy test, result negative: Secondary | ICD-10-CM | POA: Insufficient documentation

## 2012-12-16 DIAGNOSIS — R109 Unspecified abdominal pain: Secondary | ICD-10-CM

## 2012-12-16 DIAGNOSIS — R110 Nausea: Secondary | ICD-10-CM | POA: Insufficient documentation

## 2012-12-16 LAB — CBC WITH DIFFERENTIAL/PLATELET
Basophils Absolute: 0 10*3/uL (ref 0.0–0.1)
Basophils Relative: 0 % (ref 0–1)
Eosinophils Absolute: 0.3 10*3/uL (ref 0.0–0.7)
Eosinophils Relative: 2 % (ref 0–5)
MCH: 32.3 pg (ref 26.0–34.0)
MCHC: 36.3 g/dL — ABNORMAL HIGH (ref 30.0–36.0)
MCV: 88.9 fL (ref 78.0–100.0)
Platelets: 276 10*3/uL (ref 150–400)
RDW: 14.2 % (ref 11.5–15.5)

## 2012-12-16 LAB — COMPREHENSIVE METABOLIC PANEL
ALT: 18 U/L (ref 0–35)
AST: 17 U/L (ref 0–37)
Albumin: 4 g/dL (ref 3.5–5.2)
Calcium: 9.9 mg/dL (ref 8.4–10.5)
GFR calc Af Amer: >90 mL/min (ref >90)
Glucose, Bld: 78 mg/dL (ref 70–99)
Sodium: 138 mEq/L (ref 135–145)
Total Protein: 7.7 g/dL (ref 6.0–8.3)

## 2012-12-16 LAB — URINALYSIS, ROUTINE W REFLEX MICROSCOPIC
Bilirubin Urine: NEGATIVE
Nitrite: POSITIVE — AB
Specific Gravity, Urine: 1.019 (ref 1.005–1.030)
pH: 6.5 (ref 5.0–8.0)

## 2012-12-16 LAB — URINE MICROSCOPIC-ADD ON

## 2012-12-16 NOTE — ED Notes (Signed)
Reports intermittent abd cramping, nausea,  and feeling like something is moving around inside her abd x 2 weeks.  Pt states she has had marina for over 1 yr.

## 2012-12-16 NOTE — ED Notes (Signed)
Pt given update on blood work.  Informed pt results were not back yet.

## 2012-12-16 NOTE — ED Notes (Signed)
Pt not found in room by PA.  Gown seen on chair by bed.

## 2012-12-17 NOTE — ED Provider Notes (Signed)
Medical screening examination/treatment/procedure(s) were performed by non-physician practitioner and as supervising physician I was immediately available for consultation/collaboration.  Addalynne Golding, MD 12/17/12 0757 

## 2012-12-17 NOTE — ED Provider Notes (Signed)
I never saw this patient. Patient left without being evaluated and/or examined.   Raymon Mutton, PA-C 12/17/12 [phone]

## 2012-12-18 LAB — URINE CULTURE

## 2012-12-19 ENCOUNTER — Telehealth (HOSPITAL_COMMUNITY): Payer: Self-pay | Admitting: Emergency Medicine

## 2012-12-19 NOTE — ED Notes (Signed)
Post ED Visit - Positive Culture Follow-up: Successful Patient Follow-Up  Culture assessed and recommendations reviewed by: []  Wes Dulaney, Pharm.D., BCPS []  Celedonio Miyamoto, Pharm.D., BCPS []  Georgina Pillion, Pharm.D., BCPS [x]  [place], [place].D., BCPS, AAHIVP []  Estella Husk, Pharm.D., BCPS, AAHIVP  Positive urine culture  [x]  Patient discharged without antimicrobial prescription and treatment is now indicated []  Organism is resistant to prescribed ED discharge antimicrobial []  Patient with positive blood cultures  Changes discussed with ED provider: Coral Ceo New antibiotic prescription recommend Macrobid  100 mg BID x 7    Contacted patient,No answer date 12/20/2010 time 1507   Larena Sox 12/19/2012, 3:06 PM

## 2012-12-19 NOTE — Progress Notes (Signed)
ED Antimicrobial Stewardship Positive Culture Follow Up   [name] is an 22 y.o. female who presented to [facility] on 12/16/2012 with a chief complaint of intermittent abdominal cramping and nausea  Chief Complaint  Patient presents with  . Abdominal Cramping    Recent Results (from the past 720 hour(s))  URINE CULTURE     Status: None   Collection Time    12/16/12  7:58 PM      Result Value Range Status   Specimen Description URINE, RANDOM   Final   Special Requests NONE   Final   Culture  Setup Time 12/17/2012 02:37   Final   Colony Count >=100,000 COLONIES/ML   Final   Culture ESCHERICHIA COLI   Final   Report Status 12/18/2012 FINAL   Final   Organism ID, Bacteria ESCHERICHIA COLI   Final    []  Treated with , organism resistant to prescribed antimicrobial [x]  Patient discharged originally without antimicrobial agent and treatment is now indicated -- the patient left prior to being seen by the EDP  New antibiotic prescription: Macrobid 100 mg twice daily for 7 days  ED Provider: Wynetta Emery, PA-C  Rolley Sims 12/19/2012, 11:00 AM Infectious Diseases Pharmacist Phone# [phone]

## 2012-12-20 ENCOUNTER — Telehealth (HOSPITAL_COMMUNITY): Payer: Self-pay | Admitting: Emergency Medicine

## 2012-12-21 ENCOUNTER — Telehealth (HOSPITAL_COMMUNITY): Payer: Self-pay | Admitting: Emergency Medicine

## 2013-01-26 ENCOUNTER — Telehealth (HOSPITAL_COMMUNITY): Payer: Self-pay | Admitting: Emergency Medicine

## 2013-01-26 NOTE — ED Notes (Signed)
No response to letter sent after 30 days. Chart sent to Medical Records. °

## 2013-06-19 ENCOUNTER — Emergency Department (HOSPITAL_COMMUNITY)
Admission: EM | Admit: 2013-06-19 | Discharge: 2013-06-19 | Disposition: A | Discharge status: Home / Self Care | Payer: Medicaid Other | Attending: Emergency Medicine | Admitting: Emergency Medicine

## 2013-06-19 ENCOUNTER — Encounter (HOSPITAL_COMMUNITY): Payer: Self-pay | Admitting: Emergency Medicine

## 2013-06-19 DIAGNOSIS — N72 Inflammatory disease of cervix uteri: Secondary | ICD-10-CM

## 2013-06-19 DIAGNOSIS — N898 Other specified noninflammatory disorders of vagina: Secondary | ICD-10-CM | POA: Insufficient documentation

## 2013-06-19 DIAGNOSIS — F17200 Nicotine dependence, unspecified, uncomplicated: Secondary | ICD-10-CM | POA: Insufficient documentation

## 2013-06-19 DIAGNOSIS — Z79899 Other long term (current) drug therapy: Secondary | ICD-10-CM | POA: Insufficient documentation

## 2013-06-19 DIAGNOSIS — J45909 Unspecified asthma, uncomplicated: Secondary | ICD-10-CM | POA: Insufficient documentation

## 2013-06-19 DIAGNOSIS — Z87440 Personal history of urinary (tract) infections: Secondary | ICD-10-CM | POA: Insufficient documentation

## 2013-06-19 LAB — WET PREP, GENITAL
Clue Cells Wet Prep HPF POC: NONE SEEN
Trich, Wet Prep: NONE SEEN
Yeast Wet Prep HPF POC: NONE SEEN

## 2013-06-19 LAB — SYPHILIS: RPR W/REFLEX TO RPR TITER AND TREPONEMAL ANTIBODIES, TRADITIONAL SCREENING AND DIAGNOSIS ALGORITHM: RPR Ser Ql: NONREACTIVE

## 2013-06-19 MED ORDER — AZITHROMYCIN 1 G PO PACK
1.0000 g | PACK | Freq: Once | ORAL | Status: AC
  Administered 2013-06-19: 1 g via ORAL
  Filled 2013-06-19: qty 1

## 2013-06-19 MED ORDER — METRONIDAZOLE 0.75 % VA GEL: 1.0000 | Freq: Two times a day (BID) | VAGINAL | Status: DC

## 2013-06-19 MED ORDER — CEFTRIAXONE SODIUM 250 MG IJ SOLR
250.0000 mg | Freq: Once | INTRAMUSCULAR | Status: AC
  Administered 2013-06-19: 250 mg via INTRAMUSCULAR
  Filled 2013-06-19: qty 250

## 2013-06-19 NOTE — ED Notes (Signed)
Patient is resting comfortably. 

## 2013-06-19 NOTE — ED Notes (Signed)
Main lab called, they found the wet prep.

## 2013-06-19 NOTE — ED Notes (Signed)
Attempted to discharge pt, noted pt not in room and clothes gone.

## 2013-06-19 NOTE — ED Provider Notes (Signed)
CSN: [id number]     Arrival date & time 06/19/13  0433 History   First MD Initiated Contact with Patient 06/19/13 [phone]     No chief complaint on file.  (Consider location/radiation/quality/duration/timing/severity/associated sxs/prior Treatment) HPI Comments: 22 yo female with smoking, Chlamydia hx presents with vaginal odor and mild discharge.  No urinary sxs.  Pt is sexually active with same partner.  No abd pain or vomiting, no currently pregnant.    The history is provided by the patient.    Past Medical History  Diagnosis Date  . Asthma   . Urinary tract infection     Pyelonephritis during pregnancy  . 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 No past surgeries     No family history on file. History  Substance Use Topics  . Smoking status: Current Every Day Smoker -- 1.00 packs/day    Types: Cigarettes  . Smokeless tobacco: Never Used  . Alcohol Use: No   OB History   Grav Para Term Preterm Abortions TAB SAB Ect Mult Living   3 3 3  0 0 0 0 0 0 3     Review of Systems  Constitutional: Negative for fever and chills.  HENT: Negative for congestion.   Respiratory: Negative for shortness of breath.   Cardiovascular: Negative for chest pain.  Gastrointestinal: Negative for vomiting and abdominal pain.  Genitourinary: Positive for vaginal discharge. Negative for dysuria, flank pain and vaginal bleeding.  Musculoskeletal: Negative for back pain, neck pain and neck stiffness.  Skin: Negative for rash.    Allergies  Review of patient's allergies indicates no known allergies.  Home Medications   Current Outpatient Rx  Name  Route  Sig  Dispense  Refill  . albuterol (PROVENTIL HFA;VENTOLIN HFA) 108 (90 BASE) MCG/ACT inhaler   Inhalation   Inhale 2 puffs into the lungs every 6 (six) hours as needed for wheezing.         Marland Kitchen EXPIRED: levonorgestrel (MIRENA) 20 MCG/24HR IUD   Intrauterine   1 each by Intrauterine route once.   1 each    0    BP 120/72  Temp(Src) 98.1 F (36.7 C) (Oral)  Resp 18  SpO2 100% Physical Exam  Nursing note and vitals reviewed. Constitutional: She is oriented to person, place, and time. She appears well-developed and well-nourished.  HENT:  Head: Normocephalic and atraumatic.  Eyes: Conjunctivae are normal. Right eye exhibits no discharge. Left eye exhibits no discharge.  Neck: Normal range of motion. Neck supple. No tracheal deviation present.  Cardiovascular: Normal rate.   Pulmonary/Chest: Effort normal.  Abdominal: Soft. She exhibits no distension. There is no tenderness. There is no guarding.  Neurological: She is alert and oriented to person, place, and time.  Skin: Skin is warm. No rash noted.  Psychiatric: She has a normal mood and affect.    ED Course  Procedures (including critical care time) Labs Review Labs Reviewed  GC/CHLAMYDIA PROBE AMP  WET PREP, GENITAL  URINALYSIS, ROUTINE W REFLEX MICROSCOPIC  PREGNANCY, URINE  RPR   Imaging Review No results found.  EKG Interpretation   None       MDM   1. Vaginal discharge   2. Cervicitis    Concern for vaginitis vs STD. Pelvic exam and UA.   Mild cervical tenderness, white discharge, no adnexal tenderness. With STD hx, prophylactic treatment.   Rocephin and azithro in ED.   Well appearing. Fup outpt.  Safe sex practices discussed.  Pt refused to give urine sample, pt refused to wait for result despite multiple discussions. Stressed fup with women's.   Results and differential diagnosis were discussed with the patient. Close follow up outpatient was discussed, patient comfortable with the plan.   Diagnosis: Cervicitis, vaginal discharge    Enid Skeens, MD 06/19/13 [phone]

## 2013-06-19 NOTE — ED Notes (Signed)
Pt not in restroom.  Pt has not returned to room.

## 2013-06-19 NOTE — ED Notes (Signed)
Called lab to check on wet prep and GC/Chlamydia results.  Lab states they do not have the specimens.  Notified Dr. Jodi Mourning.
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
## 2013-06-19 NOTE — ED Notes (Signed)
Pt left prior to going over discharge paperwork and did not sign for discharge.  Unknown at what time pt left her room.
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
## 2013-06-20 LAB — GC/CHLAMYDIA PROBE AMP
CT Probe RNA: POSITIVE — AB
GC Probe RNA: NEGATIVE

## 2013-06-21 ENCOUNTER — Telehealth (HOSPITAL_COMMUNITY): Payer: Self-pay

## 2013-06-21 NOTE — ED Notes (Signed)
Pt calling for STD results.  ID verified x 2.  Pt informed (+) for chlamydia but tx rcvd in ED appropriate.  Advised pt to inform partner(s) for testing and tx and abstain from sex x 2 wks from tx/ED visit date.  [employer] form completed and faxed.

## 2013-08-05 ENCOUNTER — Ambulatory Visit: Payer: Medicaid Other | Admitting: Family Medicine

## 2013-08-21 ENCOUNTER — Ambulatory Visit: Payer: Medicaid Other | Admitting: Family Medicine

## 2013-09-02 ENCOUNTER — Ambulatory Visit: Payer: Medicaid Other | Admitting: Family Medicine

## 2013-09-09 ENCOUNTER — Encounter: Payer: Self-pay | Admitting: Family Medicine

## 2013-09-09 ENCOUNTER — Other Ambulatory Visit (HOSPITAL_COMMUNITY)
Admission: RE | Admit: 2013-09-09 | Discharge: 2013-09-09 | Disposition: A | Discharge status: Home / Self Care | Payer: Medicaid Other | Source: Ambulatory Visit | Attending: Family Medicine | Admitting: Family Medicine

## 2013-09-09 ENCOUNTER — Ambulatory Visit (INDEPENDENT_AMBULATORY_CARE_PROVIDER_SITE_OTHER): Payer: Medicaid Other | Admitting: Family Medicine

## 2013-09-09 VITALS — Temp 98.4°F | Wt 181.0 lb

## 2013-09-09 DIAGNOSIS — Z309 Encounter for contraceptive management, unspecified: Secondary | ICD-10-CM

## 2013-09-09 DIAGNOSIS — Z111 Encounter for screening for respiratory tuberculosis: Secondary | ICD-10-CM

## 2013-09-09 DIAGNOSIS — Z113 Encounter for screening for infections with a predominantly sexual mode of transmission: Secondary | ICD-10-CM

## 2013-09-09 DIAGNOSIS — Z124 Encounter for screening for malignant neoplasm of cervix: Secondary | ICD-10-CM

## 2013-09-09 DIAGNOSIS — Z01419 Encounter for gynecological examination (general) (routine) without abnormal findings: Secondary | ICD-10-CM | POA: Insufficient documentation

## 2013-09-09 LAB — POCT WET PREP (WET MOUNT): Clue Cells Wet Prep Whiff POC: POSITIVE

## 2013-09-09 LAB — RPR

## 2013-09-09 NOTE — Progress Notes (Signed)
Patient ID: Joanna [name], female   DOB: 03-04-1991, 23 y.o.   MRN: [id number] Subjective:   CC: IUD removal  HPI:   IUD removal - Patient is here for IUD removal. She states she got this placed 2-3 years ago and wants it out "because I don't want it anymore." She reports normal menstrual cycles that did not chance once she got the IUD, denies being able to feel the IUD in place, denies discomfort, and denies side effects noticed due to IUD. She does not want to become pregnant. She plans to use condoms.  Health maintenance - Of note, her last pap was 10/2010 and was normal. She wants this repeated today.  Needs TB test - She needs this for work.   Review of Systems - Per HPI.   SH: Smoker  Objective:  Physical Exam Temp(Src) 98.4 F (36.9 C) (Oral)  Wt 181 lb (82.101 kg)  LMP 08/26/2013 GEN: NAD HEENT: Atraumatic, normocephalic, neck supple, EOMI, sclera clear  PULM: Normal effort ABD: Soft, nontender, nondistended, no organomegaly PSYCH: Mood and affect euthymic, normal rate and volume of speech NEURO: Awake, alert, no focal deficits grossly, normal speech GU: Vagina normal appearing, mild thin whitish discharge; cervix mildly dilated 1/2 cm, mild bleeding with manipulation, no cervical motion tenderness; bimanual exam with normal size of uterus and adnexa and no tenderness.   IUD removal - Pt signed consent for removal of IUD. IUD strings visualized during speculum exam. Ring forceps used to grasp IUD strings and pulled with gentle outward traction. IUD visible, grasped at base and turned slowly to release it from cervical os. IUD released easily. Mild cervical bleeding noted. Patient tolerated procedure well with no complications. Return precautions of excessive bleeding or signs of infection reviewed.  Assessment:     Joanna [name] is a 23 y.o. female with h/o IUD placement here for removal of IUD.    Plan:     # See problem list and after visit summary for  problem-specific plans.  # Health Maintenance: Pap smear and STD testing today along with HIV, RPR, GC/chlamydia, and wet prep.  Follow-up: Follow up in 1 month when convenient for health maintenance visit and f/u of asthma, eczema, and mood d/o.  Leona SingletonMaria T Latonda Larrivee, MD [facility]

## 2013-09-09 NOTE — Patient Instructions (Signed)
Great to see you today.  You had your IUD removed today. Strongly consider other long-term birth control options. You decided for now to use condoms. Watch for excess bleeding, fevers, or abnormal discharge or severe pain. Those would be reasons to return.  You are also getting labs. I will call if anything is NOT normal. Otherwise, I will send a letter.  You are getting a TB skin test today for work.  Come back to see me this month when convenient to follow-up on your health.  Joanna SingletonMaria T Taquisha Phung, MD

## 2013-09-10 LAB — HIV ANTIBODY (ROUTINE TESTING W REFLEX): HIV: NONREACTIVE

## 2013-09-10 LAB — CERVICOVAGINAL ANCILLARY ONLY
Chlamydia: NEGATIVE
NEISSERIA GONORRHEA: NEGATIVE

## 2013-09-11 DIAGNOSIS — Z3009 Encounter for other general counseling and advice on contraception: Secondary | ICD-10-CM | POA: Insufficient documentation

## 2013-09-11 DIAGNOSIS — Z111 Encounter for screening for respiratory tuberculosis: Secondary | ICD-10-CM | POA: Insufficient documentation

## 2013-09-11 DIAGNOSIS — Z0000 Encounter for general adult medical examination without abnormal findings: Secondary | ICD-10-CM | POA: Insufficient documentation

## 2013-09-11 NOTE — Assessment & Plan Note (Addendum)
Patient desired IUD removal though reported no complications from IUD, placed 06/2011. - IUD removed, patient tolerated procedure well. - Discussed birth control options; patient most interested in condoms; not interested in longer-term birth control at this time. - Return precautions reviewed.

## 2013-09-11 NOTE — Assessment & Plan Note (Signed)
Needs TB skin test for work. Did not endorse coughing, blood sputum, unintentional weight loss, or exposure. - TB skin test placed. - Return in 48 hours for read.

## 2013-09-13 ENCOUNTER — Ambulatory Visit (INDEPENDENT_AMBULATORY_CARE_PROVIDER_SITE_OTHER): Payer: Medicaid Other | Admitting: *Deleted

## 2013-09-13 DIAGNOSIS — Z111 Encounter for screening for respiratory tuberculosis: Secondary | ICD-10-CM

## 2013-09-13 NOTE — Progress Notes (Signed)
   Pt in for ppd reading placed on  09/09/2013.  Pt advised it was to late to have ppd read today and a repeat ppd needed to be done.  PPD place Left forearm.  Pt to return 09/16/2013 for reading.  Clovis PuMartin, Camika Marsico L, RN

## 2013-09-16 ENCOUNTER — Ambulatory Visit (INDEPENDENT_AMBULATORY_CARE_PROVIDER_SITE_OTHER): Payer: Medicaid Other | Admitting: *Deleted

## 2013-09-16 ENCOUNTER — Encounter: Payer: Self-pay | Admitting: *Deleted

## 2013-09-16 DIAGNOSIS — Z111 Encounter for screening for respiratory tuberculosis: Secondary | ICD-10-CM

## 2013-09-16 LAB — TB SKIN TEST
INDURATION: 0 mm
TB Skin Test: NEGATIVE

## 2013-09-17 ENCOUNTER — Telehealth: Payer: Self-pay | Admitting: Family Medicine

## 2013-09-17 NOTE — Telephone Encounter (Signed)
Patient's phone went to busy tone. Tried again and it said "number is incorrect". Please call and let patient know recent pap smear, wet prep, and gonorrhea/chlamydia testing were normal except for bacterial vaginosis which is a vaginal infection not transmitted sexually that often clears on its own without treatment. I am happy to treat if patient is having an uncomfortable amount of discharge or odor. Thanks. Leona SingletonMaria T Kailand Seda, MD

## 2013-09-17 NOTE — Telephone Encounter (Signed)
Attempted again with same response. Joanna [name]  

## 2013-09-17 NOTE — Telephone Encounter (Signed)
Number incorrect x 2 again.  Do you want to mail a letter? Whitley Strycharz, Maryjo RochesterJessica Dawn

## 2013-09-18 ENCOUNTER — Encounter: Payer: Self-pay | Admitting: Family Medicine

## 2013-09-18 NOTE — Telephone Encounter (Signed)
I can write a letter right now. Thanks!  Joanna SingletonMaria T Kashten Gowin, MD

## 2014-03-10 ENCOUNTER — Encounter (HOSPITAL_COMMUNITY): Payer: Self-pay | Admitting: Emergency Medicine

## 2014-03-10 ENCOUNTER — Emergency Department (HOSPITAL_COMMUNITY)
Admission: EM | Admit: 2014-03-10 | Discharge: 2014-03-10 | Discharge status: Against Medical Advice | Payer: Medicaid Other | Attending: Emergency Medicine | Admitting: Emergency Medicine

## 2014-03-10 DIAGNOSIS — Z3200 Encounter for pregnancy test, result unknown: Secondary | ICD-10-CM | POA: Insufficient documentation

## 2014-03-10 DIAGNOSIS — J45909 Unspecified asthma, uncomplicated: Secondary | ICD-10-CM | POA: Insufficient documentation

## 2014-03-10 DIAGNOSIS — F17200 Nicotine dependence, unspecified, uncomplicated: Secondary | ICD-10-CM | POA: Diagnosis not present

## 2014-03-10 LAB — POC URINE PREG, ED: Preg Test, Ur: POSITIVE — AB

## 2014-03-10 NOTE — ED Notes (Signed)
Pt reports 3 weeks late for period. Requesting pregnancy test. Reports is more tired than normal.

## 2014-03-11 ENCOUNTER — Telehealth (HOSPITAL_BASED_OUTPATIENT_CLINIC_OR_DEPARTMENT_OTHER): Payer: Self-pay | Admitting: Emergency Medicine

## 2014-03-11 ENCOUNTER — Encounter (HOSPITAL_COMMUNITY): Payer: Self-pay | Admitting: Emergency Medicine

## 2014-03-11 ENCOUNTER — Emergency Department (HOSPITAL_COMMUNITY)
Admission: EM | Admit: 2014-03-11 | Discharge: 2014-03-11 | Disposition: A | Discharge status: Home / Self Care | Payer: Medicaid Other | Attending: Family Medicine | Admitting: Family Medicine

## 2014-03-11 DIAGNOSIS — Z3201 Encounter for pregnancy test, result positive: Secondary | ICD-10-CM | POA: Diagnosis not present

## 2014-03-11 DIAGNOSIS — F17200 Nicotine dependence, unspecified, uncomplicated: Secondary | ICD-10-CM | POA: Diagnosis not present

## 2014-03-11 DIAGNOSIS — J45909 Unspecified asthma, uncomplicated: Secondary | ICD-10-CM | POA: Diagnosis not present

## 2014-03-11 DIAGNOSIS — Z331 Pregnant state, incidental: Secondary | ICD-10-CM

## 2014-03-11 DIAGNOSIS — Z87440 Personal history of urinary (tract) infections: Secondary | ICD-10-CM | POA: Diagnosis not present

## 2014-03-11 DIAGNOSIS — Z3200 Encounter for pregnancy test, result unknown: Secondary | ICD-10-CM | POA: Diagnosis present

## 2014-03-11 LAB — POC URINE PREG, ED: Preg Test, Ur: POSITIVE — AB

## 2014-03-11 NOTE — ED Notes (Signed)
Patient states she is two weeks late from having her period and wants a pregnancy test.  Denies problems.

## 2014-03-11 NOTE — ED Provider Notes (Signed)
CSN: [id number]     Arrival date & time 03/11/14  5621 History   First MD Initiated Contact with Patient 03/11/14 [phone]     Chief Complaint  Patient presents with  . Possible Pregnancy     (Consider location/radiation/quality/duration/timing/severity/associated sxs/prior Treatment) Patient is a 23 y.o. female presenting with pregnancy problem. The history is provided by the patient.  Possible Pregnancy The current episode started more than 2 days ago. The problem has been unchanged. Patient reports no vaginal bleeding. Primary symptoms: lmp early july, none since, G3P3.Marland Kitchen There has been no prenatal care. Associated symptoms: No birth control .    Past Medical History  Diagnosis Date  . Asthma   . Urinary tract infection     Pyelonephritis during pregnancy  . 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 No past surgeries     No family history on file. History  Substance Use Topics  . Smoking status: Current Every Day Smoker -- 0.25 packs/day    Types: Cigarettes  . Smokeless tobacco: Never Used  . Alcohol Use: No   OB History   Grav Para Term Preterm Abortions TAB SAB Ect Mult Living   0 0 0 0 0 0 3     Review of Systems  Constitutional: Negative.   Gastrointestinal: Negative.   Genitourinary: Negative.  Negative for vaginal bleeding, menstrual problem and pelvic pain.      Allergies  Review of patient's allergies indicates no known allergies.  Home Medications   Prior to Admission medications   Not on File   BP 126/69  Pulse 90  Temp(Src) 98.3 F (36.8 C) (Oral)  Resp 18  Ht  (1.626 m)  Wt 173 lb (78.472 kg)  BMI 29.68 kg/m2  SpO2 100%  LMP 01/17/2014 Physical Exam  Nursing note and vitals reviewed. Constitutional: She is oriented to person, place, and time. She appears well-developed and well-nourished.  Abdominal: Soft. Bowel sounds are normal. She exhibits no mass. There is no tenderness.  Neurological: She is  alert and oriented to person, place, and time.  Skin: Skin is warm and dry.    ED Course  Procedures (including critical care time) Labs Review Labs Reviewed  POC URINE PREG, ED - Abnormal; Notable for the following:    Preg Test, Ur POSITIVE (*)    All other components within normal limits   preg test pos Imaging Review No results found.   EKG Interpretation None      MDM   Final diagnoses:  Pregnancy as incidental finding        Linna Hoff, MD 03/11/14 1007

## 2014-03-11 NOTE — Discharge Instructions (Signed)
See your doctor for prenatal care. °

## 2014-03-12 ENCOUNTER — Encounter: Payer: Self-pay | Admitting: Family Medicine

## 2014-03-12 ENCOUNTER — Ambulatory Visit (INDEPENDENT_AMBULATORY_CARE_PROVIDER_SITE_OTHER): Payer: Medicaid Other | Admitting: Family Medicine

## 2014-03-12 VITALS — BP 138/78 | HR 92 | Temp 98.9°F | Wt 215.0 lb

## 2014-03-12 DIAGNOSIS — Z3201 Encounter for pregnancy test, result positive: Secondary | ICD-10-CM

## 2014-03-12 DIAGNOSIS — N912 Amenorrhea, unspecified: Secondary | ICD-10-CM

## 2014-03-12 LAB — POCT URINE PREGNANCY: Preg Test, Ur: POSITIVE

## 2014-03-12 MED ORDER — PRENATAL VITAMINS 0.8 MG PO TABS: 1.0000 | ORAL_TABLET | Freq: Every day | ORAL | Status: DC

## 2014-03-12 NOTE — Progress Notes (Signed)
Patient ID: IRMA ROULHAC, female   DOB: [birth year], 23 y.o.   MRN: [id number] Subjective:   CC: Confirm pregnancy  HPI:   Marnesha is a 23 y.o. female here for repeat pregnancy test. She is very uncertain about her LMP but thinks it was possibly 2 weeks ago. She has had unprotected sex and is on no birth control. She took home preg test which was positive x 3 but she did not trust it so she went to [facility] where she had another pos UPT. She would be happy with it if she is pregnant but just wants to know. She has had fatigue and occasional nausea. She denies abdominal pain, dysuria, vaginal bleeding, abnormal discharge, fevers, or chills.   Review of Systems - Per HPI.   SH: Lives with FOB, has 1 sexual partner, does not feel at risk for STD. Smoking status: Current every days smoker 0.25 ppd; wants to quit.  Denies alcohol or drug use. OB Hx: No problem with prior pregnancies. [id number], all vaginal. Has been GBP positive with all pregnancies.    Objective:  Physical Exam LMP 01/17/2014 BP 138/78  Pulse 92  Temp(Src) 98.9 F (37.2 C) (Oral)  Wt 215 lb (97.523 kg)  LMP 01/17/2014 Repeat BP 122/70 GEN: NAD CV: RRR PULM: CTAB ABD: S/NT/ND    Assessment:     LAUREL HARNDEN is a 23 y.o. female with h/o tobacco abuse, mood disorder, eczema, and asthma here for repeat pregnancy test.    Plan:     Positive pregnancy test UPT in clinic also positive. Discussed pregnancy, and patient is happy about it. She does not have pregnancy medicaid yet and on review, her medicaid is currently expired. Uncertain of dates. - Discussed getting pregnancy medicaid. - call our clinic to set up prenatal labs and initial prenatal visit. - start prenatal vitamins and miralax PRN constipation. - Work on smoking cessation encouraged. - will need early [place] for dating confirmation, once she has insurance coverage. - Return precautions reviewed.  Follow-up: Follow up when have insurance for prenatal labs  and initial prenatal visit.   Leona Singleton, MD [facility]

## 2014-03-12 NOTE — Patient Instructions (Addendum)
Your clinic urine pregnancy test today is positive. The number one task will be to get pregnancy medicaid since your regular medicaid looks expired. Go to the front on your way out and set up an initial prenatal visit and a prior nurse visit for labs. Start taking a prenatal vitamin. Use a stool softener if you start having constipation. Stay hydrated. If you have vaginal bleeding, contractions, abnormal discharge, fevers, chills, or other concerns, seek immediate care at [facility]. Once you have pregnancy medicaid, we can get labs and also schedule a dating ultrasound.  Best,  Leona Singleton, MD

## 2014-03-13 DIAGNOSIS — Z3201 Encounter for pregnancy test, result positive: Secondary | ICD-10-CM | POA: Insufficient documentation

## 2014-03-13 NOTE — Assessment & Plan Note (Signed)
UPT in clinic also positive. Discussed pregnancy, and patient is happy about it. She does not have pregnancy medicaid yet and on review, her medicaid is currently expired. Uncertain of dates. - Discussed getting pregnancy medicaid. - call our clinic to set up prenatal labs and initial prenatal visit. - start prenatal vitamins and miralax PRN constipation. - Work on smoking cessation encouraged. - will need early [place] for dating confirmation, once she has insurance coverage. - Return precautions reviewed.

## 2014-04-17 ENCOUNTER — Encounter: Payer: Self-pay | Admitting: Family Medicine

## 2014-04-17 NOTE — Progress Notes (Unsigned)
Pt dropped off form to be filled out regarding authorization to begin or return back to work, best number to reach is [phone].

## 2014-04-17 NOTE — Progress Notes (Signed)
No clinic input needed.  Placed in provider's box.  Cyrah Mclamb,CMA

## 2014-04-24 NOTE — Progress Notes (Signed)
Filled out and placed up front.  Joanna SingletonMaria T Koleman Marling, MD

## 2014-04-30 ENCOUNTER — Other Ambulatory Visit: Payer: Medicaid Other

## 2014-04-30 DIAGNOSIS — Z3481 Encounter for supervision of other normal pregnancy, first trimester: Secondary | ICD-10-CM

## 2014-04-30 NOTE — Progress Notes (Signed)
Drew prenatal labs, sickle cell = negative on 10-15-10

## 2014-05-01 LAB — OBSTETRIC PANEL
Antibody Screen: NEGATIVE
BASOS ABS: 0 10*3/uL (ref 0.0–0.1)
Basophils Relative: 0 % (ref 0–1)
EOS PCT: 2 % (ref 0–5)
Eosinophils Absolute: 0.2 10*3/uL (ref 0.0–0.7)
HEMATOCRIT: 39.4 % (ref 36.0–46.0)
Hemoglobin: 13.3 g/dL (ref 12.0–15.0)
Hepatitis B Surface Ag: NEGATIVE
LYMPHS ABS: 2.9 10*3/uL (ref 0.7–4.0)
LYMPHS PCT: 25 % (ref 12–46)
MCH: 31 pg (ref 26.0–34.0)
MCHC: 33.8 g/dL (ref 30.0–36.0)
MCV: 91.8 fL (ref 78.0–100.0)
MONO ABS: 0.9 10*3/uL (ref 0.1–1.0)
Monocytes Relative: 8 % (ref 3–12)
NEUTROS ABS: 7.5 10*3/uL (ref 1.7–7.7)
Neutrophils Relative %: 65 % (ref 43–77)
Platelets: 297 10*3/uL (ref 150–400)
RBC: 4.29 MIL/uL (ref 3.87–5.11)
RDW: 14 % (ref 11.5–15.5)
RUBELLA: 1.05 {index} — AB (ref <0.90)
Rh Type: POSITIVE
WBC: 11.5 10*3/uL — AB (ref 4.0–10.5)

## 2014-05-01 LAB — HIV ANTIBODY (ROUTINE TESTING W REFLEX): HIV: NONREACTIVE

## 2014-05-02 LAB — CULTURE, OB URINE
Colony Count: NO GROWTH
Organism ID, Bacteria: NO GROWTH

## 2014-05-07 ENCOUNTER — Other Ambulatory Visit (HOSPITAL_COMMUNITY)
Admission: RE | Admit: 2014-05-07 | Discharge: 2014-05-07 | Disposition: A | Discharge status: Home / Self Care | Payer: Medicaid Other | Source: Ambulatory Visit | Attending: Family Medicine | Admitting: Family Medicine

## 2014-05-07 ENCOUNTER — Ambulatory Visit (INDEPENDENT_AMBULATORY_CARE_PROVIDER_SITE_OTHER): Payer: Medicaid Other | Admitting: Family Medicine

## 2014-05-07 ENCOUNTER — Encounter: Payer: Self-pay | Admitting: Family Medicine

## 2014-05-07 VITALS — BP 120/61 | HR 90 | Temp 98.1°F | Wt 217.0 lb

## 2014-05-07 DIAGNOSIS — Z113 Encounter for screening for infections with a predominantly sexual mode of transmission: Secondary | ICD-10-CM | POA: Insufficient documentation

## 2014-05-07 DIAGNOSIS — Z3481 Encounter for supervision of other normal pregnancy, first trimester: Secondary | ICD-10-CM

## 2014-05-07 NOTE — Progress Notes (Signed)
Subjective:    Joanna [name] is being seen today for her first obstetrical visit.  This is not a planned pregnancy. She is at Unknown gestation. Her obstetrical history is significant for smoker. Relationship with FOB: significant other, living together. Patient does intend to breast feed. Pregnancy history fully reviewed.  Menstrual History: OB History   Grav Para Term Preterm Abortions TAB SAB Ect Mult Living   4 3 3  0 0 0 0 0 0 3      Menarche age: [age]  Patient's last menstrual period was 01/17/2014.    The following portions of the patient's history were reviewed and updated as appropriate: allergies, current medications, past family history, past medical history, past social history, past surgical history and problem list.  Review of Systems Pertinent items are noted in HPI.    Objective:    BP 120/61  Pulse 90  Temp(Src) 98.1 F (36.7 C)  Wt 217 lb (98.431 kg)  LMP 01/17/2014  General Appearance:    Alert, cooperative, no distress, appears stated age  Head:    Normocephalic, without obvious abnormality, atraumatic  Eyes:    PERRL, conjunctiva/corneas clear  Ears:    Normal TM's and external ear canals, both ears  Nose:   Nares normal, septum midline, mucosa normal, no drainage    or sinus tenderness  Throat:   Lips, mucosa, and tongue normal; teeth and gums normal  Neck:   Supple, symmetrical, trachea midline, no adenopathy;     Lungs:     Clear to auscultation bilaterally, respirations unlabored  Chest Wall:    No tenderness or deformity   Heart:    Regular rate and rhythm, S1 and S2 normal, no murmur, rub   or gallop  Abdomen:     Soft, non-tender, bowel sounds active all four quadrants,    no masses, no organomegaly  Genitalia:    Normal female without lesion, discharge or tenderness  Extremities:   Extremities normal, atraumatic, no cyanosis or edema  Pulses:   2+ and symmetric all extremities  Skin:   Skin color, texture, turgor normal, no rashes or lesions      Assessment:    Pregnancy at 15 and 3/7 weeks    Plan:    Initial labs drawn. Prenatal vitamins. Problem list reviewed and updated. AFP3 discussed: declined. Role of ultrasound in pregnancy discussed;For dating: ordered. Amniocentesis discussed: declined. Follow up in 4 weeks. 50% of 60 min visit spent on counseling and coordination of care.

## 2014-05-07 NOTE — Patient Instructions (Signed)
First Trimester of Pregnancy The first trimester of pregnancy is from week 1 until the end of week 12 (months 1 through 3). A week after a sperm fertilizes an egg, the egg will implant on the wall of the uterus. This embryo will begin to develop into a baby. Genes from you and your partner are forming the baby. The female genes determine whether the baby is a boy or a girl. At 6-8 weeks, the eyes and face are formed, and the heartbeat can be seen on ultrasound. At the end of 12 weeks, all the baby's organs are formed.  Now that you are pregnant, you will want to do everything you can to have a healthy baby. Two of the most important things are to get good prenatal care and to follow your health care provider's instructions. Prenatal care is all the medical care you receive before the baby's birth. This care will help prevent, find, and treat any problems during the pregnancy and childbirth. BODY CHANGES Your body goes through many changes during pregnancy. The changes vary from woman to woman.   You may gain or lose a couple of pounds at first.  You may feel sick to your stomach (nauseous) and throw up (vomit). If the vomiting is uncontrollable, call your health care provider.  You may tire easily.  You may develop headaches that can be relieved by medicines approved by your health care provider.  You may urinate more often. Painful urination may mean you have a bladder infection.  You may develop heartburn as a result of your pregnancy.  You may develop constipation because certain hormones are causing the muscles that push waste through your intestines to slow down.  You may develop hemorrhoids or swollen, bulging veins (varicose veins).  Your breasts may begin to grow larger and become tender. Your nipples may stick out more, and the tissue that surrounds them (areola) may become darker.  Your gums may bleed and may be sensitive to brushing and flossing.  Dark spots or blotches (chloasma,  mask of pregnancy) may develop on your face. This will likely fade after the baby is born.  Your menstrual periods will stop.  You may have a loss of appetite.  You may develop cravings for certain kinds of food.  You may have changes in your emotions from day to day, such as being excited to be pregnant or being concerned that something may go wrong with the pregnancy and baby.  You may have more vivid and strange dreams.  You may have changes in your hair. These can include thickening of your hair, rapid growth, and changes in texture. Some women also have hair loss during or after pregnancy, or hair that feels dry or thin. Your hair will most likely return to normal after your baby is born. WHAT TO EXPECT AT YOUR PRENATAL VISITS During a routine prenatal visit:  You will be weighed to make sure you and the baby are growing normally.  Your blood pressure will be taken.  Your abdomen will be measured to track your baby's growth.  The fetal heartbeat will be listened to starting around week 10 or 12 of your pregnancy.  Test results from any previous visits will be discussed. Your health care provider may ask you:  How you are feeling.  If you are feeling the baby move.  If you have had any abnormal symptoms, such as leaking fluid, bleeding, severe headaches, or abdominal cramping.  If you have any questions. Other tests   that may be performed during your first trimester include:  Blood tests to find your blood type and to check for the presence of any previous infections. They will also be used to check for low iron levels (anemia) and Rh antibodies. Later in the pregnancy, blood tests for diabetes will be done along with other tests if problems develop.  Urine tests to check for infections, diabetes, or protein in the urine.  An ultrasound to confirm the proper growth and development of the baby.  An amniocentesis to check for possible genetic problems.  Fetal screens for  spina bifida and Down syndrome.  You may need other tests to make sure you and the baby are doing well. HOME CARE INSTRUCTIONS  Medicines  Follow your health care provider's instructions regarding medicine use. Specific medicines may be either safe or unsafe to take during pregnancy.  Take your prenatal vitamins as directed.  If you develop constipation, try taking a stool softener if your health care provider approves. Diet  Eat regular, well-balanced meals. Choose a variety of foods, such as meat or vegetable-based protein, fish, milk and low-fat dairy products, vegetables, fruits, and whole grain breads and cereals. Your health care provider will help you determine the amount of weight gain that is right for you.  Avoid raw meat and uncooked cheese. These carry germs that can cause birth defects in the baby.  Eating four or five small meals rather than three large meals a day may help relieve nausea and vomiting. If you start to feel nauseous, eating a few soda crackers can be helpful. Drinking liquids between meals instead of during meals also seems to help nausea and vomiting.  If you develop constipation, eat more high-fiber foods, such as fresh vegetables or fruit and whole grains. Drink enough fluids to keep your urine clear or pale yellow. Activity and Exercise  Exercise only as directed by your health care provider. Exercising will help you:  Control your weight.  Stay in shape.  Be prepared for labor and delivery.  Experiencing pain or cramping in the lower abdomen or low back is a good sign that you should stop exercising. Check with your health care provider before continuing normal exercises.  Try to avoid standing for long periods of time. Move your legs often if you must stand in one place for a long time.  Avoid heavy lifting.  Wear low-heeled shoes, and practice good posture.  You may continue to have sex unless your health care provider directs you  otherwise. Relief of Pain or Discomfort  Wear a good support bra for breast tenderness.   Take warm sitz baths to soothe any pain or discomfort caused by hemorrhoids. Use hemorrhoid cream if your health care provider approves.   Rest with your legs elevated if you have leg cramps or low back pain.  If you develop varicose veins in your legs, wear support hose. Elevate your feet for 15 minutes, 3-4 times a day. Limit salt in your diet. Prenatal Care  Schedule your prenatal visits by the twelfth week of pregnancy. They are usually scheduled monthly at first, then more often in the last 2 months before delivery.  Write down your questions. Take them to your prenatal visits.  Keep all your prenatal visits as directed by your health care provider. Safety  Wear your seat belt at all times when driving.  Make a list of emergency phone numbers, including numbers for family, friends, the hospital, and police and fire departments. General Tips    Ask your health care provider for a referral to a local prenatal education class. Begin classes no later than at the beginning of month 6 of your pregnancy.  Ask for help if you have counseling or nutritional needs during pregnancy. Your health care provider can offer advice or refer you to specialists for help with various needs.  Do not use hot tubs, steam rooms, or saunas.  Do not douche or use tampons or scented sanitary pads.  Do not cross your legs for long periods of time.  Avoid cat litter boxes and soil used by cats. These carry germs that can cause birth defects in the baby and possibly loss of the fetus by miscarriage or stillbirth.  Avoid all smoking, herbs, alcohol, and medicines not prescribed by your health care provider. Chemicals in these affect the formation and growth of the baby.  Schedule a dentist appointment. At home, brush your teeth with a soft toothbrush and be gentle when you floss. SEEK MEDICAL CARE IF:   You have  dizziness.  You have mild pelvic cramps, pelvic pressure, or nagging pain in the abdominal area.  You have persistent nausea, vomiting, or diarrhea.  You have a bad smelling vaginal discharge.  You have pain with urination.  You notice increased swelling in your face, hands, legs, or ankles. SEEK IMMEDIATE MEDICAL CARE IF:   You have a fever.  You are leaking fluid from your vagina.  You have spotting or bleeding from your vagina.  You have severe abdominal cramping or pain.  You have rapid weight gain or loss.  You vomit blood or material that looks like coffee grounds.  You are exposed to German measles and have never had them.  You are exposed to fifth disease or chickenpox.  You develop a severe headache.  You have shortness of breath.  You have any kind of trauma, such as from a fall or a car accident. Document Released: 06/28/2001 Document Revised: 11/18/2013 Document Reviewed: 05/14/2013 [employer] Patient Information 2015 [employer]. This information is not intended to replace advice given to you by your health care provider. Make sure you discuss any questions you have with your health care provider.  

## 2014-05-08 LAB — CERVICOVAGINAL ANCILLARY ONLY
CHLAMYDIA, DNA PROBE: NEGATIVE
Neisseria Gonorrhea: NEGATIVE

## 2014-05-13 ENCOUNTER — Other Ambulatory Visit: Payer: Self-pay | Admitting: Family Medicine

## 2014-05-13 ENCOUNTER — Ambulatory Visit (HOSPITAL_COMMUNITY)
Admission: RE | Admit: 2014-05-13 | Discharge: 2014-05-13 | Disposition: A | Discharge status: Home / Self Care | Payer: Medicaid Other | Source: Ambulatory Visit | Attending: Family Medicine | Admitting: Family Medicine

## 2014-05-13 DIAGNOSIS — Z36 Encounter for antenatal screening of mother: Secondary | ICD-10-CM | POA: Insufficient documentation

## 2014-05-13 DIAGNOSIS — Z3481 Encounter for supervision of other normal pregnancy, first trimester: Secondary | ICD-10-CM

## 2014-05-13 DIAGNOSIS — Z3492 Encounter for supervision of normal pregnancy, unspecified, second trimester: Secondary | ICD-10-CM

## 2014-05-13 DIAGNOSIS — Z3689 Encounter for other specified antenatal screening: Secondary | ICD-10-CM

## 2014-05-13 DIAGNOSIS — Z3A15 15 weeks gestation of pregnancy: Secondary | ICD-10-CM | POA: Diagnosis not present

## 2014-05-19 ENCOUNTER — Encounter: Payer: Self-pay | Admitting: Family Medicine

## 2014-06-20 ENCOUNTER — Encounter: Payer: Medicaid Other | Admitting: Family Medicine

## 2014-06-26 ENCOUNTER — Ambulatory Visit (INDEPENDENT_AMBULATORY_CARE_PROVIDER_SITE_OTHER): Payer: Medicaid Other | Admitting: Family Medicine

## 2014-06-26 VITALS — BP 93/68 | HR 103 | Temp 98.2°F | Wt 215.0 lb

## 2014-06-26 DIAGNOSIS — Z3482 Encounter for supervision of other normal pregnancy, second trimester: Secondary | ICD-10-CM

## 2014-06-26 NOTE — Patient Instructions (Signed)
Second Trimester of Pregnancy The second trimester is from week 13 through week 28, months 4 through 6. The second trimester is often a time when you feel your best. Your body has also adjusted to being pregnant, and you begin to feel better physically. Usually, morning sickness has lessened or quit completely, you may have more energy, and you may have an increase in appetite. The second trimester is also a time when the fetus is growing rapidly. At the end of the sixth month, the fetus is about 9 inches long and weighs about 1 pounds. You will likely begin to feel the baby move (quickening) between 18 and 20 weeks of the pregnancy. BODY CHANGES Your body goes through many changes during pregnancy. The changes vary from woman to woman.   Your weight will continue to increase. You will notice your lower abdomen bulging out.  You may begin to get stretch marks on your hips, abdomen, and breasts.  You may develop headaches that can be relieved by medicines approved by your health care provider.  You may urinate more often because the fetus is pressing on your bladder.  You may develop or continue to have heartburn as a result of your pregnancy.  You may develop constipation because certain hormones are causing the muscles that push waste through your intestines to slow down.  You may develop hemorrhoids or swollen, bulging veins (varicose veins).  You may have back pain because of the weight gain and pregnancy hormones relaxing your joints between the bones in your pelvis and as a result of a shift in weight and the muscles that support your balance.  Your breasts will continue to grow and be tender.  Your gums may bleed and may be sensitive to brushing and flossing.  Dark spots or blotches (chloasma, mask of pregnancy) may develop on your face. This will likely fade after the baby is born.  A dark line from your belly button to the pubic area (linea nigra) may appear. This will likely fade  after the baby is born.  You may have changes in your hair. These can include thickening of your hair, rapid growth, and changes in texture. Some women also have hair loss during or after pregnancy, or hair that feels dry or thin. Your hair will most likely return to normal after your baby is born. WHAT TO EXPECT AT YOUR PRENATAL VISITS During a routine prenatal visit:  You will be weighed to make sure you and the fetus are growing normally.  Your blood pressure will be taken.  Your abdomen will be measured to track your baby's growth.  The fetal heartbeat will be listened to.  Any test results from the previous visit will be discussed. Your health care provider may ask you:  How you are feeling.  If you are feeling the baby move.  If you have had any abnormal symptoms, such as leaking fluid, bleeding, severe headaches, or abdominal cramping.  If you have any questions. Other tests that may be performed during your second trimester include:  Blood tests that check for:  Low iron levels (anemia).  Gestational diabetes (between 24 and 28 weeks).  Rh antibodies.  Urine tests to check for infections, diabetes, or protein in the urine.  An ultrasound to confirm the proper growth and development of the baby.  An amniocentesis to check for possible genetic problems.  Fetal screens for spina bifida and Down syndrome. HOME CARE INSTRUCTIONS   Avoid all smoking, herbs, alcohol, and unprescribed   drugs. These chemicals affect the formation and growth of the baby.  Follow your health care provider's instructions regarding medicine use. There are medicines that are either safe or unsafe to take during pregnancy.  Exercise only as directed by your health care provider. Experiencing uterine cramps is a good sign to stop exercising.  Continue to eat regular, healthy meals.  Wear a good support bra for breast tenderness.  Do not use hot tubs, steam rooms, or saunas.  Wear your  seat belt at all times when driving.  Avoid raw meat, uncooked cheese, cat litter boxes, and soil used by cats. These carry germs that can cause birth defects in the baby.  Take your prenatal vitamins.  Try taking a stool softener (if your health care provider approves) if you develop constipation. Eat more high-fiber foods, such as fresh vegetables or fruit and whole grains. Drink plenty of fluids to keep your urine clear or pale yellow.  Take warm sitz baths to soothe any pain or discomfort caused by hemorrhoids. Use hemorrhoid cream if your health care provider approves.  If you develop varicose veins, wear support hose. Elevate your feet for 15 minutes, 3-4 times a day. Limit salt in your diet.  Avoid heavy lifting, wear low heel shoes, and practice good posture.  Rest with your legs elevated if you have leg cramps or low back pain.  Visit your dentist if you have not gone yet during your pregnancy. Use a soft toothbrush to brush your teeth and be gentle when you floss.  A sexual relationship may be continued unless your health care provider directs you otherwise.  Continue to go to all your prenatal visits as directed by your health care provider. SEEK MEDICAL CARE IF:   You have dizziness.  You have mild pelvic cramps, pelvic pressure, or nagging pain in the abdominal area.  You have persistent nausea, vomiting, or diarrhea.  You have a bad smelling vaginal discharge.  You have pain with urination. SEEK IMMEDIATE MEDICAL CARE IF:   You have a fever.  You are leaking fluid from your vagina.  You have spotting or bleeding from your vagina.  You have severe abdominal cramping or pain.  You have rapid weight gain or loss.  You have shortness of breath with chest pain.  You notice sudden or extreme swelling of your face, hands, ankles, feet, or legs.  You have not felt your baby move in over an hour.  You have severe headaches that do not go away with  medicine.  You have vision changes. Document Released: 06/28/2001 Document Revised: 07/09/2013 Document Reviewed: 09/04/2012 Exit[employer] Patient Information 2015 [employer]. This information is not intended to replace advice given to you by your health care provider. Make sure you discuss any questions you have with your health care provider.  

## 2014-06-26 NOTE — Progress Notes (Addendum)
Valerie Roysiffany S Jaworowski is a 23 y.o. [id number] at [address] by [facility].  A: Pt. Doing well at home. Her only complaint is intermittent headaches relieved by Tylenol. Denies nausea, vomiting, vaginal bleeding/discharge, leaking of fluids. She endorses good fetal movement. He acknowledged liver precautions, and has had 3 other children prior to this so as well in tune with her body. She has no other complaints at this time.  P:  - Labor precautions reviewed - 1Hr. Glucola scheduled - Anatomic Ultrasound scheduled - Follow up in 4 weeks.   [facility] ATTENDING NOTE Kehinde Eniola,MD I  have seen and examined this patient, reviewed their chart. I have discussed this patient with the resident. I agree with the resident's findings, assessment and care plan.

## 2014-06-26 NOTE — Addendum Note (Signed)
Addended by: Janit PaganENIOLA, Maddon Horton T on: 06/26/2014 10:17 AM   Modules accepted: Level of Service

## 2014-06-27 ENCOUNTER — Telehealth: Payer: Self-pay | Admitting: Family Medicine

## 2014-06-27 NOTE — Telephone Encounter (Signed)
Tamika, please contact patient to schedule flu shot vaccination with you. I reviewed her record, it does not seem she got one this year. Thanks.

## 2014-06-27 NOTE — Telephone Encounter (Signed)
Spoke with Pt regarding flu vaccine. She stated she will think about getting the vaccine.  Stressed the importance of receiving the vaccine.  If she decides to get the flu vaccine to call and schedule an appt with the nurse.  Clovis PuMartin, Tamika L, RN

## 2014-06-30 ENCOUNTER — Ambulatory Visit (HOSPITAL_COMMUNITY)
Admission: RE | Admit: 2014-06-30 | Discharge: 2014-06-30 | Disposition: A | Discharge status: Home / Self Care | Payer: Medicaid Other | Source: Ambulatory Visit | Attending: Family Medicine | Admitting: Family Medicine

## 2014-06-30 DIAGNOSIS — Z3482 Encounter for supervision of other normal pregnancy, second trimester: Secondary | ICD-10-CM | POA: Insufficient documentation

## 2014-07-01 DIAGNOSIS — O99332 Smoking (tobacco) complicating pregnancy, second trimester: Secondary | ICD-10-CM | POA: Insufficient documentation

## 2014-07-01 DIAGNOSIS — IMO0002 Reserved for concepts with insufficient information to code with codable children: Secondary | ICD-10-CM | POA: Insufficient documentation

## 2014-07-01 DIAGNOSIS — Z0489 Encounter for examination and observation for other specified reasons: Secondary | ICD-10-CM | POA: Insufficient documentation

## 2014-07-01 DIAGNOSIS — O99210 Obesity complicating pregnancy, unspecified trimester: Secondary | ICD-10-CM | POA: Insufficient documentation

## 2014-07-01 DIAGNOSIS — Z3A22 22 weeks gestation of pregnancy: Secondary | ICD-10-CM | POA: Insufficient documentation

## 2014-07-01 DIAGNOSIS — O99212 Obesity complicating pregnancy, second trimester: Secondary | ICD-10-CM

## 2014-07-18 NOTE — L&D Delivery Note (Cosign Needed)
Delivery Note  Patient quickly progressed and precipitously delivered the head. Providers were called and arrived in room with head still out. Patient then pushed and easily delivered.  At 8:32 PM a viable female was delivered via Vaginal, Spontaneous Delivery (Presentation:LOA).  APGAR: pending weight pending Placenta status: Intact, Spontaneous.  Cord: 3 vessels with the following complications: Short.  Cord pH: n/a  Anesthesia: Epidural  Episiotomy: None Lacerations:  none Suture Repair: n/a Est. Blood Loss (mL):  150cc  Mom to postpartum.  Baby to Couplet care / Skin to Skin.  Levert FeinsteinMcIntyre, Brittany 11/05/2014, 8:46 PM   Patient is a [id number] at 6422w0d who was admitted for postdates IOL, essentially uncomplicated prenatal course.  She progressed with augmentation via Pitocin.  I was gloved and present for delivery in its entirety.  Second stage of labor progressed rapidly; RN reports that after pt rec'd a second epidural that she sat up and felt increased rectal pressure, and head was visible. Dr Pollie MeyerMcIntyre and I walked into room prior to del of shoulders.  Complications: none  Lacerations: none  EBL: 150cc  Vanita Cannell, CNM 9:10 PM

## 2014-07-22 ENCOUNTER — Ambulatory Visit (INDEPENDENT_AMBULATORY_CARE_PROVIDER_SITE_OTHER): Payer: Medicaid Other | Admitting: Family Medicine

## 2014-07-22 ENCOUNTER — Encounter: Payer: Self-pay | Admitting: Family Medicine

## 2014-07-22 VITALS — BP 125/67 | HR 92 | Temp 98.3°F | Wt 212.8 lb

## 2014-07-22 DIAGNOSIS — Z3482 Encounter for supervision of other normal pregnancy, second trimester: Secondary | ICD-10-CM

## 2014-07-22 NOTE — Progress Notes (Signed)
Subjective:    Joanna [name] here for f/u OB visit.  Doing well, denies abdominal pain, vaginal bleeding/discharge, HA, blurred vision, peripheral edema.   Menstrual History: OB History    Gravida Para Term Preterm AB TAB SAB Ectopic Multiple Living   4 3 3  0 0 0 0 0 0 3      Menarche age: [age]  Patient's last menstrual period was 01/17/2014.    The following portions of the patient's history were reviewed and updated as appropriate: allergies, current medications, past family history, past medical history, past social history, past surgical history and problem list.  Review of Systems Pertinent items are noted in HPI.    Objective:    BP 125/67 mmHg  Pulse 92  Temp(Src) 98.3 F (36.8 C)  Wt 212 lb 12.8 oz (96.525 kg)  LMP 01/17/2014  Gen: NAD Neck: No thyromegaly  Lung: CTAB Cardio: RRR Perp: No pedal edema B/L    Assessment:    Pregnancy at 25.6 wks dated from 1st trimester US     Plan:   - F/U in 3-4 weeks - Tdap, 1 hr GTT, CBC, HIV, RPR @ that visit  - Labor precautions discussed

## 2014-07-22 NOTE — Patient Instructions (Signed)
Second Trimester of Pregnancy The second trimester is from week 13 through week 28, months 4 through 6. The second trimester is often a time when you feel your best. Your body has also adjusted to being pregnant, and you begin to feel better physically. Usually, morning sickness has lessened or quit completely, you may have more energy, and you may have an increase in appetite. The second trimester is also a time when the fetus is growing rapidly. At the end of the sixth month, the fetus is about 9 inches long and weighs about 1 pounds. You will likely begin to feel the baby move (quickening) between 18 and 20 weeks of the pregnancy. BODY CHANGES Your body goes through many changes during pregnancy. The changes vary from woman to woman.   Your weight will continue to increase. You will notice your lower abdomen bulging out.  You may begin to get stretch marks on your hips, abdomen, and breasts.  You may develop headaches that can be relieved by medicines approved by your health care provider.  You may urinate more often because the fetus is pressing on your bladder.  You may develop or continue to have heartburn as a result of your pregnancy.  You may develop constipation because certain hormones are causing the muscles that push waste through your intestines to slow down.  You may develop hemorrhoids or swollen, bulging veins (varicose veins).  You may have back pain because of the weight gain and pregnancy hormones relaxing your joints between the bones in your pelvis and as a result of a shift in weight and the muscles that support your balance.  Your breasts will continue to grow and be tender.  Your gums may bleed and may be sensitive to brushing and flossing.  Dark spots or blotches (chloasma, mask of pregnancy) may develop on your face. This will likely fade after the baby is born.  A dark line from your belly button to the pubic area (linea nigra) may appear. This will likely fade  after the baby is born.  You may have changes in your hair. These can include thickening of your hair, rapid growth, and changes in texture. Some women also have hair loss during or after pregnancy, or hair that feels dry or thin. Your hair will most likely return to normal after your baby is born. WHAT TO EXPECT AT YOUR PRENATAL VISITS During a routine prenatal visit:  You will be weighed to make sure you and the fetus are growing normally.  Your blood pressure will be taken.  Your abdomen will be measured to track your baby's growth.  The fetal heartbeat will be listened to.  Any test results from the previous visit will be discussed. Your health care provider may ask you:  How you are feeling.  If you are feeling the baby move.  If you have had any abnormal symptoms, such as leaking fluid, bleeding, severe headaches, or abdominal cramping.  If you have any questions. Other tests that may be performed during your second trimester include:  Blood tests that check for:  Low iron levels (anemia).  Gestational diabetes (between 24 and 28 weeks).  Rh antibodies.  Urine tests to check for infections, diabetes, or protein in the urine.  An ultrasound to confirm the proper growth and development of the baby.  An amniocentesis to check for possible genetic problems.  Fetal screens for spina bifida and Down syndrome. HOME CARE INSTRUCTIONS   Avoid all smoking, herbs, alcohol, and unprescribed   drugs. These chemicals affect the formation and growth of the baby.  Follow your health care provider's instructions regarding medicine use. There are medicines that are either safe or unsafe to take during pregnancy.  Exercise only as directed by your health care provider. Experiencing uterine cramps is a good sign to stop exercising.  Continue to eat regular, healthy meals.  Wear a good support bra for breast tenderness.  Do not use hot tubs, steam rooms, or saunas.  Wear your  seat belt at all times when driving.  Avoid raw meat, uncooked cheese, cat litter boxes, and soil used by cats. These carry germs that can cause birth defects in the baby.  Take your prenatal vitamins.  Try taking a stool softener (if your health care provider approves) if you develop constipation. Eat more high-fiber foods, such as fresh vegetables or fruit and whole grains. Drink plenty of fluids to keep your urine clear or pale yellow.  Take warm sitz baths to soothe any pain or discomfort caused by hemorrhoids. Use hemorrhoid cream if your health care provider approves.  If you develop varicose veins, wear support hose. Elevate your feet for 15 minutes, 3-4 times a day. Limit salt in your diet.  Avoid heavy lifting, wear low heel shoes, and practice good posture.  Rest with your legs elevated if you have leg cramps or low back pain.  Visit your dentist if you have not gone yet during your pregnancy. Use a soft toothbrush to brush your teeth and be gentle when you floss.  A sexual relationship may be continued unless your health care provider directs you otherwise.  Continue to go to all your prenatal visits as directed by your health care provider. SEEK MEDICAL CARE IF:   You have dizziness.  You have mild pelvic cramps, pelvic pressure, or nagging pain in the abdominal area.  You have persistent nausea, vomiting, or diarrhea.  You have a bad smelling vaginal discharge.  You have pain with urination. SEEK IMMEDIATE MEDICAL CARE IF:   You have a fever.  You are leaking fluid from your vagina.  You have spotting or bleeding from your vagina.  You have severe abdominal cramping or pain.  You have rapid weight gain or loss.  You have shortness of breath with chest pain.  You notice sudden or extreme swelling of your face, hands, ankles, feet, or legs.  You have not felt your baby move in over an hour.  You have severe headaches that do not go away with  medicine.  You have vision changes. Document Released: 06/28/2001 Document Revised: 07/09/2013 Document Reviewed: 09/04/2012 Exit[employer] Patient Information 2015 [employer]. This information is not intended to replace advice given to you by your health care provider. Make sure you discuss any questions you have with your health care provider.  

## 2014-08-12 ENCOUNTER — Ambulatory Visit (INDEPENDENT_AMBULATORY_CARE_PROVIDER_SITE_OTHER): Payer: Medicaid Other | Admitting: Family Medicine

## 2014-08-12 DIAGNOSIS — Z23 Encounter for immunization: Secondary | ICD-10-CM

## 2014-08-12 DIAGNOSIS — Z3482 Encounter for supervision of other normal pregnancy, second trimester: Secondary | ICD-10-CM

## 2014-08-12 LAB — RPR

## 2014-08-12 LAB — CBC WITH DIFFERENTIAL/PLATELET
Basophils Absolute: 0 10*3/uL (ref 0.0–0.1)
Basophils Relative: 0 % (ref 0–1)
EOS ABS: 0.2 10*3/uL (ref 0.0–0.7)
Eosinophils Relative: 2 % (ref 0–5)
HCT: 34.5 % — ABNORMAL LOW (ref 36.0–46.0)
Hemoglobin: 11.4 g/dL — ABNORMAL LOW (ref 12.0–15.0)
Lymphocytes Relative: 24 % (ref 12–46)
Lymphs Abs: 2.7 10*3/uL (ref 0.7–4.0)
MCH: 30 pg (ref 26.0–34.0)
MCHC: 33 g/dL (ref 30.0–36.0)
MCV: 90.8 fL (ref 78.0–100.0)
MONOS PCT: 7 % (ref 3–12)
MPV: 10.3 fL (ref 8.6–12.4)
Monocytes Absolute: 0.8 10*3/uL (ref 0.1–1.0)
NEUTROS PCT: 67 % (ref 43–77)
Neutro Abs: 7.6 10*3/uL (ref 1.7–7.7)
Platelets: 326 10*3/uL (ref 150–400)
RBC: 3.8 MIL/uL — ABNORMAL LOW (ref 3.87–5.11)
RDW: 13.8 % (ref 11.5–15.5)
WBC: 11.3 10*3/uL — ABNORMAL HIGH (ref 4.0–10.5)

## 2014-08-12 LAB — HIV ANTIBODY (ROUTINE TESTING W REFLEX): HIV: NONREACTIVE

## 2014-08-12 LAB — GLUCOSE, CAPILLARY
Comment 1: 1
Glucose-Capillary: 134 mg/dL — ABNORMAL HIGH (ref 70–99)

## 2014-08-12 NOTE — Progress Notes (Signed)
Subjective:    Valerie Roysiffany S Sindelar here for f/u OB visit.  Doing well, denies abdominal pain, vaginal bleeding/discharge, HA, blurred vision, peripheral edema.   Menstrual History: OB History    Gravida Para Term Preterm AB TAB SAB Ectopic Multiple Living   4 3 3  0 0 0 0 0 0 3      Menarche age: [age]  Patient's last menstrual period was 01/17/2014.    The following portions of the patient's history were reviewed and updated as appropriate: allergies, current medications, past family history, past medical history, past social history, past surgical history and problem list.  Review of Systems Pertinent items are noted in HPI.    Objective:    BP 105/62 mmHg  Pulse 89  Temp(Src) 97.8 F (36.6 C)  Wt 216 lb (97.977 kg)  LMP 01/17/2014  Gen: NAD Neck: No thyromegaly  Lung: CTAB Cardio: RRR Perp: No pedal edema B/L    Assessment:    Pregnancy at 28.6 wks dated from 1st trimester US     Plan:  -Doing well  - F/U in 2 wks - Tdap, 1 hr GTT, CBC, HIV, RPR today  - Labor precautions discussed   Samarah Hogle R. Paulina FusiHess, DO [facility] 08/12/2014, 1:45 PM

## 2014-08-12 NOTE — Patient Instructions (Signed)
Third Trimester of Pregnancy The third trimester is from week 29 through week 42, months 7 through 9. The third trimester is a time when the fetus is growing rapidly. At the end of the ninth month, the fetus is about 20 inches in length and weighs 6-10 pounds.  BODY CHANGES Your body goes through many changes during pregnancy. The changes vary from woman to woman.   Your weight will continue to increase. You can expect to gain 25-35 pounds (11-16 kg) by the end of the pregnancy.  You may begin to get stretch marks on your hips, abdomen, and breasts.  You may urinate more often because the fetus is moving lower into your pelvis and pressing on your bladder.  You may develop or continue to have heartburn as a result of your pregnancy.  You may develop constipation because certain hormones are causing the muscles that push waste through your intestines to slow down.  You may develop hemorrhoids or swollen, bulging veins (varicose veins).  You may have pelvic pain because of the weight gain and pregnancy hormones relaxing your joints between the bones in your pelvis. Backaches may result from overexertion of the muscles supporting your posture.  You may have changes in your hair. These can include thickening of your hair, rapid growth, and changes in texture. Some women also have hair loss during or after pregnancy, or hair that feels dry or thin. Your hair will most likely return to normal after your baby is born.  Your breasts will continue to grow and be tender. A yellow discharge may leak from your breasts called colostrum.  Your belly button may stick out.  You may feel short of breath because of your expanding uterus.  You may notice the fetus "dropping," or moving lower in your abdomen.  You may have a bloody mucus discharge. This usually occurs a few days to a week before labor begins.  Your cervix becomes thin and soft (effaced) near your due date. WHAT TO EXPECT AT YOUR PRENATAL  EXAMS  You will have prenatal exams every 2 weeks until week 36. Then, you will have weekly prenatal exams. During a routine prenatal visit:  You will be weighed to make sure you and the fetus are growing normally.  Your blood pressure is taken.  Your abdomen will be measured to track your baby's growth.  The fetal heartbeat will be listened to.  Any test results from the previous visit will be discussed.  You may have a cervical check near your due date to see if you have effaced. At around 36 weeks, your caregiver will check your cervix. At the same time, your caregiver will also perform a test on the secretions of the vaginal tissue. This test is to determine if a type of bacteria, Group B streptococcus, is present. Your caregiver will explain this further. Your caregiver may ask you:  What your birth plan is.  How you are feeling.  If you are feeling the baby move.  If you have had any abnormal symptoms, such as leaking fluid, bleeding, severe headaches, or abdominal cramping.  If you have any questions. Other tests or screenings that may be performed during your third trimester include:  Blood tests that check for low iron levels (anemia).  Fetal testing to check the health, activity level, and growth of the fetus. Testing is done if you have certain medical conditions or if there are problems during the pregnancy. FALSE LABOR You may feel small, irregular contractions that   eventually go away. These are called Braxton Hicks contractions, or false labor. Contractions may last for hours, days, or even weeks before true labor sets in. If contractions come at regular intervals, intensify, or become painful, it is best to be seen by your caregiver.  SIGNS OF LABOR   Menstrual-like cramps.  Contractions that are 5 minutes apart or less.  Contractions that start on the top of the uterus and spread down to the lower abdomen and back.  A sense of increased pelvic pressure or back  pain.  A watery or bloody mucus discharge that comes from the vagina. If you have any of these signs before the 37th week of pregnancy, call your caregiver right away. You need to go to the hospital to get checked immediately. HOME CARE INSTRUCTIONS   Avoid all smoking, herbs, alcohol, and unprescribed drugs. These chemicals affect the formation and growth of the baby.  Follow your caregiver's instructions regarding medicine use. There are medicines that are either safe or unsafe to take during pregnancy.  Exercise only as directed by your caregiver. Experiencing uterine cramps is a good sign to stop exercising.  Continue to eat regular, healthy meals.  Wear a good support bra for breast tenderness.  Do not use hot tubs, steam rooms, or saunas.  Wear your seat belt at all times when driving.  Avoid raw meat, uncooked cheese, cat litter boxes, and soil used by cats. These carry germs that can cause birth defects in the baby.  Take your prenatal vitamins.  Try taking a stool softener (if your caregiver approves) if you develop constipation. Eat more high-fiber foods, such as fresh vegetables or fruit and whole grains. Drink plenty of fluids to keep your urine clear or pale yellow.  Take warm sitz baths to soothe any pain or discomfort caused by hemorrhoids. Use hemorrhoid cream if your caregiver approves.  If you develop varicose veins, wear support hose. Elevate your feet for 15 minutes, 3-4 times a day. Limit salt in your diet.  Avoid heavy lifting, wear low heal shoes, and practice good posture.  Rest a lot with your legs elevated if you have leg cramps or low back pain.  Visit your dentist if you have not gone during your pregnancy. Use a soft toothbrush to brush your teeth and be gentle when you floss.  A sexual relationship may be continued unless your caregiver directs you otherwise.  Do not travel far distances unless it is absolutely necessary and only with the approval  of your caregiver.  Take prenatal classes to understand, practice, and ask questions about the labor and delivery.  Make a trial run to the hospital.  Pack your hospital bag.  Prepare the baby's nursery.  Continue to go to all your prenatal visits as directed by your caregiver. SEEK MEDICAL CARE IF:  You are unsure if you are in labor or if your water has broken.  You have dizziness.  You have mild pelvic cramps, pelvic pressure, or nagging pain in your abdominal area.  You have persistent nausea, vomiting, or diarrhea.  You have a bad smelling vaginal discharge.  You have pain with urination. SEEK IMMEDIATE MEDICAL CARE IF:   You have a fever.  You are leaking fluid from your vagina.  You have spotting or bleeding from your vagina.  You have severe abdominal cramping or pain.  You have rapid weight loss or gain.  You have shortness of breath with chest pain.  You notice sudden or extreme swelling   of your face, hands, ankles, feet, or legs.  You have not felt your baby move in over an hour.  You have severe headaches that do not go away with medicine.  You have vision changes. Document Released: 06/28/2001 Document Revised: 07/09/2013 Document Reviewed: 09/04/2012 [employer] Patient Information 2015 [employer]. This information is not intended to replace advice given to you by your health care provider. Make sure you discuss any questions you have with your health care provider.  

## 2014-08-27 ENCOUNTER — Telehealth: Payer: Self-pay | Admitting: Family Medicine

## 2014-08-27 NOTE — Telephone Encounter (Signed)
Attempted to call pt back regarding the OB US.  Unfortunately, new US will not offer too much as even sometimes when they are sure of the sex on US, the baby will come out opposite sex at time of birth.  Please let her know when she calls back about this.  As well, I have reached my 10 continuity deliveries and her care will be switched another 3rd year resident so that person can obtain their delivery numbers as well.  Will forward to her PCP   Thanks, Twana FirstBryan R. Paulina FusiHess, DO [facility] 08/27/2014, 11:29 AM

## 2014-08-27 NOTE — Telephone Encounter (Signed)
Will forward to Dr. Paulina FusiHess who is her OB provider.  Joanna [name],CMA

## 2014-08-27 NOTE — Telephone Encounter (Signed)
Pt called because she had a US and was told that she is having a girl. The problem is that she bought all girl clothes and now they are saving that they are not sure what she is having. She would like to have another US to determine the correct gender of her child. jw

## 2014-08-28 ENCOUNTER — Ambulatory Visit (INDEPENDENT_AMBULATORY_CARE_PROVIDER_SITE_OTHER): Payer: Medicaid Other | Admitting: Family Medicine

## 2014-08-28 VITALS — BP 109/69 | HR 102 | Temp 98.8°F | Wt 217.0 lb

## 2014-08-28 DIAGNOSIS — Z3483 Encounter for supervision of other normal pregnancy, third trimester: Secondary | ICD-10-CM

## 2014-08-28 DIAGNOSIS — J4540 Moderate persistent asthma, uncomplicated: Secondary | ICD-10-CM

## 2014-08-28 MED ORDER — ALBUTEROL SULFATE HFA 108 (90 BASE) MCG/ACT IN AERS: 2.0000 | INHALATION_SPRAY | RESPIRATORY_TRACT | Status: DC | PRN

## 2014-08-28 NOTE — Patient Instructions (Signed)
  Glucose was borderline but not diabetic. Watch sugar intake especially sodas, juices, sweet teas, etc. Follow up in 2 weeks with the OB clinic or with a third year. Keep trying to quit smoking. Try 1800-QUIT-NOW.  Tal,k to the front about cost and dates of circumcision in case you have a boy. Keep thinking about the flu shot. I will give you a list of birth control options. If you have contractions, bleeding, gush of fluid, or baby stops moving as much, [facility].  Leona SingletonMaria T Alyzae Hawkey, MD

## 2014-08-28 NOTE — Progress Notes (Addendum)
23 y.o. [id number] at 3566w1d here for OB visit.  S: Feeling well, no bleeding, contractions, gush of fluid, abnormal discharge, or change in fetal movement. No other complaints. O: per flowsheet.  A/P: Pregnancy at 1166w1d, doing well. - Last visit TDAP, 1 hr GTT, CBC, HIV, RPR done (normal besides mild anemia and chronic leukocytosis). 1 hr GTT high end of normal - discussed keeping added sugar to minimum. - Discussed smoking cessation. Pt down to 3-4 cig/day. QUIT line provided. - Asthma - discussed rule of thirds and rx'ed new inhaler for pt to have in case of emergency. - Pt would like circumcision if she ends up having a boy. gave information about [facility] circumcisions and pt to discuss with front office. - Discussed benefits of flu shot; pt declined. - Feeding: prefers bottle due to smoker. Rediscussed cessation. - Contraception: Unsure but not mirena or nexplanon. Provided list of options.  - Infant doctor - requests [facility] - Preterm labor signs discussed. - F/u in 2 weeks with another PGY3.

## 2014-08-28 NOTE — Telephone Encounter (Signed)
Pt in clinic today.  Constantin Hillery,CMA

## 2014-08-31 NOTE — Assessment & Plan Note (Signed)
See flow sheet

## 2014-09-10 ENCOUNTER — Encounter: Payer: Medicaid Other | Admitting: Family Medicine

## 2014-09-24 ENCOUNTER — Ambulatory Visit (INDEPENDENT_AMBULATORY_CARE_PROVIDER_SITE_OTHER): Payer: Medicaid Other | Admitting: Family Medicine

## 2014-09-24 VITALS — BP 123/70 | HR 101 | Temp 98.1°F | Wt 219.0 lb

## 2014-09-24 DIAGNOSIS — Z3483 Encounter for supervision of other normal pregnancy, third trimester: Secondary | ICD-10-CM

## 2014-09-24 NOTE — Progress Notes (Signed)
Joanna [name] is a 24 y.o. G4P3003 at 6274w0d for routine follow up.  Joanna [name] reports Joanna [name]'s doing well. Denies cramping/ctx, fluid leaking, vaginal bleeding, or decreased fetal movement.   See flow sheet for details.  A/P: Pregnancy at 5574w0d.  Doing well.   Infant feeding choice breast and bottle Contraception choice depo followed by pill GBS/GC/CZ testing was not performed today - needs this at 36 week visit  Preterm labor precautions & fetal movement precautions reviewed. Follow up 1 week.

## 2014-09-24 NOTE — Patient Instructions (Signed)
If you have any contractions which occur regularly, vaginal bleeding, fluid leaking, or are worried that baby is not moving well, go immediately to [facility] to be evaluated.   Third Trimester of Pregnancy The third trimester is from week 29 through week 42, months 7 through 9. The third trimester is a time when the fetus is growing rapidly. At the end of the ninth month, the fetus is about 20 inches in length and weighs 6-10 pounds.  BODY CHANGES Your body goes through many changes during pregnancy. The changes vary from woman to woman.   Your weight will continue to increase. You can expect to gain 25-35 pounds (11-16 kg) by the end of the pregnancy.  You may begin to get stretch marks on your hips, abdomen, and breasts.  You may urinate more often because the fetus is moving lower into your pelvis and pressing on your bladder.  You may develop or continue to have heartburn as a result of your pregnancy.  You may develop constipation because certain hormones are causing the muscles that push waste through your intestines to slow down.  You may develop hemorrhoids or swollen, bulging veins (varicose veins).  You may have pelvic pain because of the weight gain and pregnancy hormones relaxing your joints between the bones in your pelvis. Backaches may result from overexertion of the muscles supporting your posture.  You may have changes in your hair. These can include thickening of your hair, rapid growth, and changes in texture. Some women also have hair loss during or after pregnancy, or hair that feels dry or thin. Your hair will most likely return to normal after your baby is born.  Your breasts will continue to grow and be tender. A yellow discharge may leak from your breasts called colostrum.  Your belly button may stick out.  You may feel short of breath because of your expanding uterus.  You may notice the fetus "dropping," or moving lower in your abdomen.  You may have a  bloody mucus discharge. This usually occurs a few days to a week before labor begins.  Your cervix becomes thin and soft (effaced) near your due date. WHAT TO EXPECT AT YOUR PRENATAL EXAMS  You will have prenatal exams every 2 weeks until week 36. Then, you will have weekly prenatal exams. During a routine prenatal visit:  You will be weighed to make sure you and the fetus are growing normally.  Your blood pressure is taken.  Your abdomen will be measured to track your baby's growth.  The fetal heartbeat will be listened to.  Any test results from the previous visit will be discussed.  You may have a cervical check near your due date to see if you have effaced. At around 36 weeks, your caregiver will check your cervix. At the same time, your caregiver will also perform a test on the secretions of the vaginal tissue. This test is to determine if a type of bacteria, Group B streptococcus, is present. Your caregiver will explain this further. Your caregiver may ask you:  What your birth plan is.  How you are feeling.  If you are feeling the baby move.  If you have had any abnormal symptoms, such as leaking fluid, bleeding, severe headaches, or abdominal cramping.  If you have any questions. Other tests or screenings that may be performed during your third trimester include:  Blood tests that check for low iron levels (anemia).  Fetal testing to check the health, activity level,  and growth of the fetus. Testing is done if you have certain medical conditions or if there are problems during the pregnancy. FALSE LABOR You may feel small, irregular contractions that eventually go away. These are called Braxton Hicks contractions, or false labor. Contractions may last for hours, days, or even weeks before true labor sets in. If contractions come at regular intervals, intensify, or become painful, it is best to be seen by your caregiver.  SIGNS OF LABOR   Menstrual-like  cramps.  Contractions that are 5 minutes apart or less.  Contractions that start on the top of the uterus and spread down to the lower abdomen and back.  A sense of increased pelvic pressure or back pain.  A watery or bloody mucus discharge that comes from the vagina. If you have any of these signs before the 37th week of pregnancy, call your caregiver right away. You need to go to the hospital to get checked immediately. HOME CARE INSTRUCTIONS   Avoid all smoking, herbs, alcohol, and unprescribed drugs. These chemicals affect the formation and growth of the baby.  Follow your caregiver's instructions regarding medicine use. There are medicines that are either safe or unsafe to take during pregnancy.  Exercise only as directed by your caregiver. Experiencing uterine cramps is a good sign to stop exercising.  Continue to eat regular, healthy meals.  Wear a good support bra for breast tenderness.  Do not use hot tubs, steam rooms, or saunas.  Wear your seat belt at all times when driving.  Avoid raw meat, uncooked cheese, cat litter boxes, and soil used by cats. These carry germs that can cause birth defects in the baby.  Take your prenatal vitamins.  Try taking a stool softener (if your caregiver approves) if you develop constipation. Eat more high-fiber foods, such as fresh vegetables or fruit and whole grains. Drink plenty of fluids to keep your urine clear or pale yellow.  Take warm sitz baths to soothe any pain or discomfort caused by hemorrhoids. Use hemorrhoid cream if your caregiver approves.  If you develop varicose veins, wear support hose. Elevate your feet for 15 minutes, 3-4 times a day. Limit salt in your diet.  Avoid heavy lifting, wear low heal shoes, and practice good posture.  Rest a lot with your legs elevated if you have leg cramps or low back pain.  Visit your dentist if you have not gone during your pregnancy. Use a soft toothbrush to brush your teeth and  be gentle when you floss.  A sexual relationship may be continued unless your caregiver directs you otherwise.  Do not travel far distances unless it is absolutely necessary and only with the approval of your caregiver.  Take prenatal classes to understand, practice, and ask questions about the labor and delivery.  Make a trial run to the hospital.  Pack your hospital bag.  Prepare the baby's nursery.  Continue to go to all your prenatal visits as directed by your caregiver. SEEK MEDICAL CARE IF:  You are unsure if you are in labor or if your water has broken.  You have dizziness.  You have mild pelvic cramps, pelvic pressure, or nagging pain in your abdominal area.  You have persistent nausea, vomiting, or diarrhea.  You have a bad smelling vaginal discharge.  You have pain with urination. SEEK IMMEDIATE MEDICAL CARE IF:   You have a fever.  You are leaking fluid from your vagina.  You have spotting or bleeding from your vagina.  You have severe abdominal cramping or pain.  You have rapid weight loss or gain.  You have shortness of breath with chest pain.  You notice sudden or extreme swelling of your face, hands, ankles, feet, or legs.  You have not felt your baby move in over an hour.  You have severe headaches that do not go away with medicine.  You have vision changes. Document Released: 06/28/2001 Document Revised: 07/09/2013 Document Reviewed: 09/04/2012 [employer] Patient Information 2015 [place], [place]. This information is not intended to replace advice given to you by your health care provider. Make sure you discuss any questions you have with your health care provider.

## 2014-09-25 NOTE — Progress Notes (Signed)
I was the preceptor on the visit date of 09/24/14 .   Donnella ShamKyle Kashus Karlen MD

## 2014-10-02 ENCOUNTER — Ambulatory Visit (INDEPENDENT_AMBULATORY_CARE_PROVIDER_SITE_OTHER): Payer: Medicaid Other | Admitting: Family Medicine

## 2014-10-02 ENCOUNTER — Other Ambulatory Visit (HOSPITAL_COMMUNITY)
Admission: RE | Admit: 2014-10-02 | Discharge: 2014-10-02 | Disposition: A | Discharge status: Home / Self Care | Payer: Medicaid Other | Source: Ambulatory Visit | Attending: Family Medicine | Admitting: Family Medicine

## 2014-10-02 VITALS — BP 114/77 | HR 105 | Temp 98.4°F | Wt 217.0 lb

## 2014-10-02 DIAGNOSIS — Z113 Encounter for screening for infections with a predominantly sexual mode of transmission: Secondary | ICD-10-CM | POA: Insufficient documentation

## 2014-10-02 DIAGNOSIS — Z3483 Encounter for supervision of other normal pregnancy, third trimester: Secondary | ICD-10-CM

## 2014-10-02 NOTE — Patient Instructions (Signed)
Good to see you today.  If you have any bleeding, contractions, gush of fluid, or baby is not moving the same, [facility]abe[facility], or if not available, myself or Dr Pollie MeyerMcIntyre for your next OB visit. Continue eating regularly, avoiding excess sugar. Stay hydrated. Keep working on quitting smoking. We got labs today and I will call if NOT normal.  Best,  Leona SingletonMaria T Jeffrey Graefe, MD  Fetal Movement Counts Patient Name: __________________________________________________ Patient Due Date: ____________________ Performing a fetal movement count is highly recommended in high-risk pregnancies, but it is good for every pregnant woman to do. Your health care provider may ask you to start counting fetal movements at 28 weeks of the pregnancy. Fetal movements often increase:  After eating a full meal.  After physical activity.  After eating or drinking something sweet or cold.  At rest. Pay attention to when you feel the baby is most active. This will help you notice a pattern of your baby's sleep and wake cycles and what factors contribute to an increase in fetal movement. It is important to perform a fetal movement count at the same time each day when your baby is normally most active.  HOW TO COUNT FETAL MOVEMENTS 1. Find a quiet and comfortable area to sit or lie down on your left side. Lying on your left side provides the best blood and oxygen circulation to your baby. 2. Write down the day and time on a sheet of paper or in a journal. 3. Start counting kicks, flutters, swishes, rolls, or jabs in a 2-hour period. You should feel at least 10 movements within 2 hours. 4. If you do not feel 10 movements in 2 hours, wait 2-3 hours and count again. Look for a change in the pattern or not enough counts in 2 hours. SEEK MEDICAL CARE IF:  You feel less than 10 counts in 2 hours, tried twice.  There is no movement in over an  hour.  The pattern is changing or taking longer each day to reach 10 counts in 2 hours.  You feel the baby is not moving as he or she usually does. Date: ____________ Movements: ____________ Start time: ____________ Doreatha MartinFinish time: ____________

## 2014-10-02 NOTE — Progress Notes (Signed)
24 y.o. [id number] at 2556w1d here for routine prenatal care. S: Reports pressure but no pain. Denies gush of fluid, contractions, vaginal bleeding, or change in fetal movement. Denies fevers/chills or vaginal discharge, chest pain, dyspnea, or unilateral leg swelling. Good PO. O: See flowsheet; NAD, RRR at 105 A/P: 24 y.o. G4P3003 at 456w1d doing well. - GBS, GC/Chlamydia today. GBS pos prior preg. - Mild tachycardia - stable past 3 visits; no chest pain, dyspnea, or leg swelling. Breathing easily. Monitor. - 2 lb weight loss, but reports normal PO. Good fetal growth. Monitor. - Continue to work on smoking cessation. Precontemplational now to quit until after delivery. - F/u 1 week in OB clinic. - Labor precautions reviewed.

## 2014-10-02 NOTE — Assessment & Plan Note (Signed)
See flow sheet

## 2014-10-03 LAB — CERVICOVAGINAL ANCILLARY ONLY
Chlamydia: NEGATIVE
Neisseria Gonorrhea: NEGATIVE

## 2014-10-04 LAB — STREP B DNA PROBE: GBSP: DETECTED

## 2014-10-05 ENCOUNTER — Telehealth: Payer: Self-pay | Admitting: Family Medicine

## 2014-10-05 NOTE — Telephone Encounter (Signed)
Please let Ms Joanna [name] know that her GBS culture was positive so she will need to receive intrapartum antibiotics [facility] Her gonorrhea/chlamydia cultures were negative.  Leona SingletonMaria T Dlisa Barnwell, MD

## 2014-10-06 NOTE — Progress Notes (Signed)
I was preceptor for this office visit.  

## 2014-10-06 NOTE — Telephone Encounter (Signed)
Pt is aware of results. [name],CMA  

## 2014-10-08 ENCOUNTER — Ambulatory Visit (INDEPENDENT_AMBULATORY_CARE_PROVIDER_SITE_OTHER): Payer: Medicaid Other | Admitting: Family Medicine

## 2014-10-08 VITALS — BP 117/77 | HR 111 | Temp 97.9°F | Wt 217.2 lb

## 2014-10-08 DIAGNOSIS — Z3483 Encounter for supervision of other normal pregnancy, third trimester: Secondary | ICD-10-CM

## 2014-10-08 NOTE — Patient Instructions (Signed)
If you have any contractions which occur regularly, vaginal bleeding, fluid leaking, or are worried that baby is not moving well, [facility].  [facility] Dr. Mauricio PoBreen or Dr. Lum BabeEniola. If they aren't available see me or Dr. Benjamin Stainhekkekandam in 1 week. Work on eating frequent, small meals.  Be well, Dr. Pollie MeyerMcIntyre

## 2014-10-11 NOTE — Progress Notes (Signed)
Valerie Roysiffany S Conradt is a 24 y.o. G4P3003 at 241w0d for routine follow up.  Denies regular cramping/ctx, fluid leaking, vaginal bleeding, or decreased fetal movement.    See flow sheet for details. Has had some mild irregular contractions.  She's concerned about not having gained weight. She has decreased appetite. Eats about one meal per day.   A/P: Pregnancy at 401w0d.  Doing well.   Pregnancy issues include poor weight gain, but fetus growing well. Encouraged eating multiple small meals throughout the day. GBS/GC/CZ results were reviewed today - GBS positive and will need rx during labor. Ultrasound used to confirm cephalic presentation. F/u in 1 week with myself or Dr. Benjamin Stainhekkekandam [facility].

## 2014-10-15 ENCOUNTER — Encounter: Payer: Medicaid Other | Admitting: Family Medicine

## 2014-10-22 ENCOUNTER — Encounter: Payer: Medicaid Other | Admitting: Family Medicine

## 2014-10-27 NOTE — Progress Notes (Signed)
I was the preceptor for this visit.  Nishawn Rotan, MD 

## 2014-10-28 ENCOUNTER — Ambulatory Visit (INDEPENDENT_AMBULATORY_CARE_PROVIDER_SITE_OTHER): Payer: Medicaid Other | Admitting: Family Medicine

## 2014-10-28 VITALS — BP 134/60 | HR 102 | Temp 97.9°F | Wt 216.0 lb

## 2014-10-28 DIAGNOSIS — Z3483 Encounter for supervision of other normal pregnancy, third trimester: Secondary | ICD-10-CM

## 2014-10-28 NOTE — Progress Notes (Signed)
Joanna [name] is a 24 y.o. G4P3003 at 4536w6d for routine follow up.  She reports few contractions, small amount of discharge, no bleeding. Feeling baby kick. See flow sheet for details.  A/P: Pregnancy at 1836w6d.  Doing well.   Pregnancy issues include none  Infant feeding choice: breast and bottle Contraception choice: Depo shot Infant circumcision desired yes if female (doesn't know sex), [facility] GBS/GC/CZ results were reviewed today.  GBS positive Labor precautions reviewed. Kick counts reviewed.  [facility], planning on 4/20 morning if available.

## 2014-10-28 NOTE — Progress Notes (Signed)
Reviewed

## 2014-10-28 NOTE — Patient Instructions (Addendum)
We will call the [facility] hospital and plan on induction for 11/05/2014 in the morning, unless you go into labor on your own.  Fetal Movement Counts Patient Name: __________________________________________________ Patient Due Date: ____________________ Performing a fetal movement count is highly recommended in high-risk pregnancies, but it is good for every pregnant woman to do. Your health care provider may ask you to start counting fetal movements at 28 weeks of the pregnancy. Fetal movements often increase:  After eating a full meal.  After physical activity.  After eating or drinking something sweet or cold.  At rest. Pay attention to when you feel the baby is most active. This will help you notice a pattern of your baby's sleep and wake cycles and what factors contribute to an increase in fetal movement. It is important to perform a fetal movement count at the same time each day when your baby is normally most active.  HOW TO COUNT FETAL MOVEMENTS 1. Find a quiet and comfortable area to sit or lie down on your left side. Lying on your left side provides the best blood and oxygen circulation to your baby. 2. Write down the day and time on a sheet of paper or in a journal. 3. Start counting kicks, flutters, swishes, rolls, or jabs in a 2-hour period. You should feel at least 10 movements within 2 hours. 4. If you do not feel 10 movements in 2 hours, wait 2-3 hours and count again. Look for a change in the pattern or not enough counts in 2 hours. SEEK MEDICAL CARE IF:  You feel less than 10 counts in 2 hours, tried twice.  There is no movement in over an hour.  The pattern is changing or taking longer each day to reach 10 counts in 2 hours.  You feel the baby is not moving as he or she usually does. Date: ____________ Movements: ____________ Start time: ____________ Joanna [name] time: ____________  Date: ____________ Movements: ____________ Start time: ____________ Joanna [name] time:  ____________ Date: ____________ Movements: ____________ Start time: ____________ Joanna [name] time: ____________ Date: ____________ Movements: ____________ Start time: ____________ Joanna [name] time: ____________ Date: ____________ Movements: ____________ Start time: ____________ Joanna [name] time: ____________ Date: ____________ Movements: ____________ Start time: ____________ Joanna [name] time: ____________ Date: ____________ Movements: ____________ Start time: ____________ Joanna [name] time: ____________ Date: ____________ Movements: ____________ Start time: ____________ Joanna [name] time: ____________  Date: ____________ Movements: ____________ Start time: ____________ Joanna [name] time: ____________ Date: ____________ Movements: ____________ Start time: ____________ Joanna [name] time: ____________ Date: ____________ Movements: ____________ Start time: ____________ Joanna [name] time: ____________ Date: ____________ Movements: ____________ Start time: ____________ Joanna [name] time: ____________ Date: ____________ Movements: ____________ Start time: ____________ Joanna [name] time: ____________ Date: ____________ Movements: ____________ Start time: ____________ Joanna [name] time: ____________ Date: ____________ Movements: ____________ Start time: ____________ Joanna [name] time: ____________  Date: ____________ Movements: ____________ Start time: ____________ Joanna [name] time: ____________ Date: ____________ Movements: ____________ Start time: ____________ Joanna [name] time: ____________ Date: ____________ Movements: ____________ Start time: ____________ Joanna [name] time: ____________ Date: ____________ Movements: ____________ Start time: ____________ Joanna [name] time: ____________ Date: ____________ Movements: ____________ Start time: ____________ Joanna [name] time: ____________ Date: ____________ Movements: ____________ Start time: ____________ Joanna [name] time: ____________ Date: ____________ Movements: ____________ Start time: ____________ Joanna [name] time: ____________  Date: ____________ Movements:  ____________ Start time: ____________ Joanna [name] time: ____________ Date: ____________ Movements: ____________ Start time: ____________ Joanna [name] time: ____________ Date: ____________ Movements: ____________ Start time: ____________ Joanna [name] time: ____________ Date: ____________ Movements: ____________ Start time: ____________ Joanna [name] time: ____________ Date: ____________ Movements: ____________ Start time: ____________ Joanna [name] time: ____________ Date: ____________ Movements: ____________ Start  time: ____________ Joanna MartinFinish time: ____________ Date: ____________ Movements: ____________ Start time: ____________ Joanna MartinFinish time: ____________  Date: ____________ Movements: ____________ Start time: ____________ Joanna MartinFinish time: ____________ Date: ____________ Movements: ____________ Start time: ____________ Joanna MartinFinish time: ____________ Date: ____________ Movements: ____________ Start time: ____________ Joanna MartinFinish time: ____________ Date: ____________ Movements: ____________ Start time: ____________ Joanna MartinFinish time: ____________ Date: ____________ Movements: ____________ Start time: ____________ Joanna MartinFinish time: ____________ Date: ____________ Movements: ____________ Start time: ____________ Joanna MartinFinish time: ____________ Date: ____________ Movements: ____________ Start time: ____________ Joanna MartinFinish time: ____________  Date: ____________ Movements: ____________ Start time: ____________ Joanna MartinFinish time: ____________ Date: ____________ Movements: ____________ Start time: ____________ Joanna MartinFinish time: ____________ Date: ____________ Movements: ____________ Start time: ____________ Joanna MartinFinish time: ____________ Date: ____________ Movements: ____________ Start time: ____________ Joanna MartinFinish time: ____________ Date: ____________ Movements: ____________ Start time: ____________ Joanna MartinFinish time: ____________ Date: ____________ Movements: ____________ Start time: ____________ Joanna MartinFinish time: ____________ Date: ____________ Movements: ____________ Start time: ____________ Joanna MartinFinish  time: ____________  Date: ____________ Movements: ____________ Start time: ____________ Joanna MartinFinish time: ____________ Date: ____________ Movements: ____________ Start time: ____________ Joanna MartinFinish time: ____________ Date: ____________ Movements: ____________ Start time: ____________ Joanna MartinFinish time: ____________ Date: ____________ Movements: ____________ Start time: ____________ Joanna MartinFinish time: ____________ Date: ____________ Movements: ____________ Start time: ____________ Joanna MartinFinish time: ____________ Date: ____________ Movements: ____________ Start time: ____________ Joanna MartinFinish time: ____________ Date: ____________ Movements: ____________ Start time: ____________ Joanna MartinFinish time: ____________  Date: ____________ Movements: ____________ Start time: ____________ Joanna MartinFinish time: ____________ Date: ____________ Movements: ____________ Start time: ____________ Joanna MartinFinish time: ____________ Date: ____________ Movements: ____________ Start time: ____________ Joanna MartinFinish time: ____________ Date: ____________ Movements: ____________ Start time: ____________ Joanna MartinFinish time: ____________ Date: ____________ Movements: ____________ Start time: ____________ Joanna MartinFinish time: ____________ Date: ____________ Movements: ____________ Start time: ____________ Joanna MartinFinish time: ____________ Document Released: 08/03/2006 Document Revised: 11/18/2013 Document Reviewed: 04/30/2012 [employer] Patient Information 2015 [place] [place]. This information is not intended to replace advice given to you by your health care provider. Make sure you discuss any questions you have with your health care provider.

## 2014-10-29 ENCOUNTER — Telehealth (HOSPITAL_COMMUNITY): Payer: Self-pay | Admitting: *Deleted

## 2014-10-29 NOTE — Telephone Encounter (Signed)
Preadmission screen  

## 2014-10-31 ENCOUNTER — Encounter (HOSPITAL_COMMUNITY): Payer: Self-pay | Admitting: *Deleted

## 2014-10-31 ENCOUNTER — Encounter: Payer: Medicaid Other | Admitting: Family Medicine

## 2014-10-31 ENCOUNTER — Telehealth (HOSPITAL_COMMUNITY): Payer: Self-pay | Admitting: *Deleted

## 2014-10-31 NOTE — Telephone Encounter (Signed)
Preadmission screen  

## 2014-11-05 ENCOUNTER — Inpatient Hospital Stay (HOSPITAL_COMMUNITY): Payer: Medicaid Other | Admitting: Anesthesiology

## 2014-11-05 ENCOUNTER — Inpatient Hospital Stay (HOSPITAL_COMMUNITY)
Admission: RE | Admit: 2014-11-05 | Discharge: 2014-11-07 | DRG: 775 | Disposition: A | Discharge status: Home / Self Care | Payer: Medicaid Other | Source: Ambulatory Visit | Attending: Obstetrics & Gynecology | Admitting: Obstetrics & Gynecology

## 2014-11-05 ENCOUNTER — Encounter (HOSPITAL_COMMUNITY): Payer: Self-pay

## 2014-11-05 DIAGNOSIS — O481 Prolonged pregnancy: Secondary | ICD-10-CM | POA: Diagnosis present

## 2014-11-05 DIAGNOSIS — O99333 Smoking (tobacco) complicating pregnancy, third trimester: Secondary | ICD-10-CM | POA: Diagnosis not present

## 2014-11-05 DIAGNOSIS — O480 Post-term pregnancy: Secondary | ICD-10-CM | POA: Diagnosis not present

## 2014-11-05 DIAGNOSIS — F17210 Nicotine dependence, cigarettes, uncomplicated: Secondary | ICD-10-CM | POA: Diagnosis present

## 2014-11-05 DIAGNOSIS — Z3A41 41 weeks gestation of pregnancy: Secondary | ICD-10-CM | POA: Diagnosis present

## 2014-11-05 DIAGNOSIS — O99824 Streptococcus B carrier state complicating childbirth: Secondary | ICD-10-CM | POA: Diagnosis not present

## 2014-11-05 LAB — HIV ANTIBODY (ROUTINE TESTING W REFLEX): HIV SCREEN 4TH GENERATION: NONREACTIVE

## 2014-11-05 LAB — ABO/RH: ABO/RH(D): A POS

## 2014-11-05 LAB — CBC
HCT: 31.5 % — ABNORMAL LOW (ref 36.0–46.0)
Hemoglobin: 11 g/dL — ABNORMAL LOW (ref 12.0–15.0)
MCH: 31 pg (ref 26.0–34.0)
MCHC: 34.9 g/dL (ref 30.0–36.0)
MCV: 88.7 fL (ref 78.0–100.0)
Platelets: 304 10*3/uL (ref 150–400)
RBC: 3.55 MIL/uL — ABNORMAL LOW (ref 3.87–5.11)
RDW: 14.9 % (ref 11.5–15.5)
WBC: 13.4 10*3/uL — ABNORMAL HIGH (ref 4.0–10.5)

## 2014-11-05 LAB — RPR: RPR: NONREACTIVE

## 2014-11-05 LAB — TYPE AND SCREEN
ABO/RH(D): A POS
ANTIBODY SCREEN: NEGATIVE

## 2014-11-05 MED ORDER — PHENYLEPHRINE 40 MCG/ML (10ML) SYRINGE FOR IV PUSH (FOR BLOOD PRESSURE SUPPORT)
80.0000 ug | PREFILLED_SYRINGE | INTRAVENOUS | Status: DC | PRN
  Filled 2014-11-05: qty 2

## 2014-11-05 MED ORDER — OXYTOCIN 40 UNITS IN LACTATED RINGERS INFUSION - SIMPLE MED: 1.0000 m[IU]/min | INTRAVENOUS | Status: DC

## 2014-11-05 MED ORDER — EPHEDRINE 5 MG/ML INJ
10.0000 mg | INTRAVENOUS | Status: DC | PRN
  Filled 2014-11-05: qty 2

## 2014-11-05 MED ORDER — IBUPROFEN 600 MG PO TABS
600.0000 mg | ORAL_TABLET | Freq: Four times a day (QID) | ORAL | Status: DC
  Administered 2014-11-06 – 2014-11-07 (×6): 600 mg via ORAL
  Filled 2014-11-05 (×7): qty 1

## 2014-11-05 MED ORDER — OXYCODONE-ACETAMINOPHEN 5-325 MG PO TABS: 1.0000 | ORAL_TABLET | ORAL | Status: DC | PRN

## 2014-11-05 MED ORDER — BENZOCAINE-MENTHOL 20-0.5 % EX AERO: 1.0000 "application " | INHALATION_SPRAY | CUTANEOUS | Status: DC | PRN

## 2014-11-05 MED ORDER — DIPHENHYDRAMINE HCL 25 MG PO CAPS: 25.0000 mg | ORAL_CAPSULE | Freq: Four times a day (QID) | ORAL | Status: DC | PRN

## 2014-11-05 MED ORDER — LACTATED RINGERS IV SOLN: 500.0000 mL | Freq: Once | INTRAVENOUS | Status: DC

## 2014-11-05 MED ORDER — LIDOCAINE HCL (PF) 1 % IJ SOLN
INTRAMUSCULAR | Status: DC | PRN
  Administered 2014-11-05: 10 mL
  Administered 2014-11-05: 4 mL
  Administered 2014-11-05: 7 mL
  Administered 2014-11-05 (×2): 9 mL
  Administered 2014-11-05: 5 mL

## 2014-11-05 MED ORDER — LANOLIN HYDROUS EX OINT: TOPICAL_OINTMENT | CUTANEOUS | Status: DC | PRN

## 2014-11-05 MED ORDER — WITCH HAZEL-GLYCERIN EX PADS: 1.0000 "application " | MEDICATED_PAD | CUTANEOUS | Status: DC | PRN

## 2014-11-05 MED ORDER — SENNOSIDES-DOCUSATE SODIUM 8.6-50 MG PO TABS
2.0000 | ORAL_TABLET | ORAL | Status: DC
  Administered 2014-11-06: 2 via ORAL
  Filled 2014-11-05 (×2): qty 2

## 2014-11-05 MED ORDER — OXYCODONE-ACETAMINOPHEN 5-325 MG PO TABS: 2.0000 | ORAL_TABLET | ORAL | Status: DC | PRN

## 2014-11-05 MED ORDER — OXYCODONE-ACETAMINOPHEN 5-325 MG PO TABS
1.0000 | ORAL_TABLET | ORAL | Status: DC | PRN
  Administered 2014-11-06: 1 via ORAL
  Filled 2014-11-05: qty 1

## 2014-11-05 MED ORDER — PRENATAL MULTIVITAMIN CH
1.0000 | ORAL_TABLET | Freq: Every day | ORAL | Status: DC
  Administered 2014-11-06: 1 via ORAL
  Filled 2014-11-05: qty 1

## 2014-11-05 MED ORDER — ONDANSETRON HCL 4 MG/2ML IJ SOLN
4.0000 mg | INTRAMUSCULAR | Status: DC | PRN
Start: 2014-11-05 — End: 2014-11-07

## 2014-11-05 MED ORDER — ZOLPIDEM TARTRATE 5 MG PO TABS: 5.0000 mg | ORAL_TABLET | Freq: Every evening | ORAL | Status: DC | PRN

## 2014-11-05 MED ORDER — ONDANSETRON HCL 4 MG PO TABS: 4.0000 mg | ORAL_TABLET | ORAL | Status: DC | PRN

## 2014-11-05 MED ORDER — OXYTOCIN 40 UNITS IN LACTATED RINGERS INFUSION - SIMPLE MED: 62.5000 mL/h | INTRAVENOUS | Status: DC

## 2014-11-05 MED ORDER — DIBUCAINE 1 % RE OINT: 1.0000 "application " | TOPICAL_OINTMENT | RECTAL | Status: DC | PRN

## 2014-11-05 MED ORDER — FENTANYL 2.5 MCG/ML BUPIVACAINE 1/10 % EPIDURAL INFUSION (WH - ANES)
INTRAMUSCULAR | Status: DC | PRN
  Administered 2014-11-05: 14 mL/h via EPIDURAL
  Administered 2014-11-05: 19:00:00 via EPIDURAL

## 2014-11-05 MED ORDER — LACTATED RINGERS IV SOLN
INTRAVENOUS | Status: DC
  Administered 2014-11-05: 1000 mL via INTRAVENOUS

## 2014-11-05 MED ORDER — ACETAMINOPHEN 325 MG PO TABS
650.0000 mg | ORAL_TABLET | ORAL | Status: DC | PRN
Start: 2014-11-05 — End: 2014-11-05

## 2014-11-05 MED ORDER — CHLOROPROCAINE HCL 3 % IJ SOLN
INTRAMUSCULAR | Status: DC | PRN
  Administered 2014-11-05: 5 mL via EPIDURAL

## 2014-11-05 MED ORDER — OXYTOCIN 40 UNITS IN LACTATED RINGERS INFUSION - SIMPLE MED
1.0000 m[IU]/min | INTRAVENOUS | Status: DC
  Administered 2014-11-05: 2 m[IU]/min via INTRAVENOUS
  Filled 2014-11-05: qty 1000

## 2014-11-05 MED ORDER — BUPIVACAINE HCL (PF) 0.5 % IJ SOLN
INTRAMUSCULAR | Status: DC | PRN
  Administered 2014-11-05: 5 mL via EPIDURAL

## 2014-11-05 MED ORDER — LIDOCAINE-EPINEPHRINE (PF) 2 %-1:200000 IJ SOLN
INTRAMUSCULAR | Status: DC | PRN
  Administered 2014-11-05: 5 mL

## 2014-11-05 MED ORDER — PNEUMOCOCCAL VAC POLYVALENT 25 MCG/0.5ML IJ INJ
0.5000 mL | INJECTION | INTRAMUSCULAR | Status: DC
  Filled 2014-11-05: qty 0.5

## 2014-11-05 MED ORDER — ACETAMINOPHEN 325 MG PO TABS: 650.0000 mg | ORAL_TABLET | ORAL | Status: DC | PRN

## 2014-11-05 MED ORDER — DIPHENHYDRAMINE HCL 50 MG/ML IJ SOLN: 12.5000 mg | INTRAMUSCULAR | Status: DC | PRN

## 2014-11-05 MED ORDER — LIDOCAINE HCL (PF) 1 % IJ SOLN
30.0000 mL | INTRAMUSCULAR | Status: DC | PRN
Start: 2014-11-05 — End: 2014-11-05
  Filled 2014-11-05: qty 30

## 2014-11-05 MED ORDER — TERBUTALINE SULFATE 1 MG/ML IJ SOLN
0.2500 mg | Freq: Once | INTRAMUSCULAR | Status: DC | PRN
  Filled 2014-11-05: qty 1

## 2014-11-05 MED ORDER — FENTANYL CITRATE (PF) 100 MCG/2ML IJ SOLN
100.0000 ug | INTRAMUSCULAR | Status: DC | PRN
  Administered 2014-11-05 (×2): 100 ug via INTRAVENOUS
  Filled 2014-11-05 (×2): qty 2

## 2014-11-05 MED ORDER — CITRIC ACID-SODIUM CITRATE 334-500 MG/5ML PO SOLN: 30.0000 mL | ORAL | Status: DC | PRN

## 2014-11-05 MED ORDER — SIMETHICONE 80 MG PO CHEW: 80.0000 mg | CHEWABLE_TABLET | ORAL | Status: DC | PRN

## 2014-11-05 MED ORDER — ONDANSETRON HCL 4 MG/2ML IJ SOLN: 4.0000 mg | Freq: Four times a day (QID) | INTRAMUSCULAR | Status: DC | PRN

## 2014-11-05 MED ORDER — DEXTROSE 5 % IV SOLN
2.5000 10*6.[IU] | INTRAVENOUS | Status: DC
  Administered 2014-11-05 (×3): 2.5 10*6.[IU] via INTRAVENOUS
  Filled 2014-11-05 (×5): qty 2.5

## 2014-11-05 MED ORDER — PHENYLEPHRINE 40 MCG/ML (10ML) SYRINGE FOR IV PUSH (FOR BLOOD PRESSURE SUPPORT)
80.0000 ug | PREFILLED_SYRINGE | INTRAVENOUS | Status: DC | PRN
  Filled 2014-11-05 (×2): qty 20
  Filled 2014-11-05: qty 2
  Filled 2014-11-05: qty 20

## 2014-11-05 MED ORDER — OXYTOCIN BOLUS FROM INFUSION: 500.0000 mL | INTRAVENOUS | Status: DC

## 2014-11-05 MED ORDER — LACTATED RINGERS IV SOLN
500.0000 mL | INTRAVENOUS | Status: DC | PRN
  Administered 2014-11-05: 1000 mL via INTRAVENOUS

## 2014-11-05 MED ORDER — EPHEDRINE 5 MG/ML INJ
10.0000 mg | INTRAVENOUS | Status: DC | PRN
  Filled 2014-11-05: qty 4
  Filled 2014-11-05: qty 2

## 2014-11-05 MED ORDER — PENICILLIN G POTASSIUM 5000000 UNITS IJ SOLR
5.0000 10*6.[IU] | Freq: Once | INTRAVENOUS | Status: AC
  Administered 2014-11-05: 5 10*6.[IU] via INTRAVENOUS
  Filled 2014-11-05: qty 5

## 2014-11-05 MED ORDER — FENTANYL 2.5 MCG/ML BUPIVACAINE 1/10 % EPIDURAL INFUSION (WH - ANES)
14.0000 mL/h | INTRAMUSCULAR | Status: DC | PRN
  Administered 2014-11-05: 14 mL/h via EPIDURAL
  Filled 2014-11-05: qty 125

## 2014-11-05 NOTE — Progress Notes (Signed)
Valerie Roysiffany S Bettenhausen is a 24 y.o. [phone] at 5575w0d admitted for induction of labor due to Post dates. Due date 10/29/14.  Subjective: Feeling pressure and some back pain  Objective: BP 121/70 mmHg  Pulse 99  Temp(Src) 97.8 F (36.6 C) (Oral)  Resp 16  Ht 5\' 4"  (1.626 m)  Wt 216 lb (97.977 kg)  BMI 37.06 kg/m2  SpO2 97%  LMP 01/17/2014      FHT:  FHR: 130s bpm, variability: moderate,  accelerations:  Present,  decelerations:  Absent UC:   regular, every 3-5 minutes SVE:   Dilation: 5 Effacement (%): 90 Station: -2 Exam by:: S Grindstaff RN  Labs: Lab Results  Component Value Date   WBC 13.4* 11/05/2014   HGB 11.0* 11/05/2014   HCT 31.5* 11/05/2014   MCV 88.7 11/05/2014   PLT 304 11/05/2014    Assessment / Plan: Induction of labor due to postterm,  progressing well on pitocin  Labor: Progressing normally on pitocin Preeclampsia:  n/a Fetal Wellbeing:  Category I Pain Control:  Epidural I/D:  n/a Anticipated MOD:  NSVD  Levert FeinsteinMcIntyre, Pasha Broad 11/05/2014, 7:29 PM

## 2014-11-05 NOTE — H&P (Signed)
LABOR ADMISSION HISTORY AND PHYSICAL  Joanna [name] is a 24 y.o. female [phone] with IUP at 1855w0d by 4089w5d presenting for IOL 2/2 prolonged pregnancy. She reports +FMs, No LOF, no VB, no blurry vision, headaches or peripheral edema, and RUQ pain.  She plans on bottle feeding. She request depo for birth control.  Dating: By 4289w5d sono --->  Estimated Date of Delivery: 10/29/14   Clinic [facility]  Dating   Genetic Screen Declined  Anatomic US Declined  GTT 1 hr 134  TDaP vaccine Given   Flu vaccine Declined  GBS Pos prior preg.  Contraception Uncertain; considering depo; option list given  Baby Food Undecided with smoking hx  Circumcision Desires if female - discussed [facility] pricing and scheduling  Pediatrician [facility] - Thekkekandam  Support Person   Other labs:   Rh: Positive  GC/Chlamydia: Negative 10/02/14  Provider - Leona SingletonMaria T Thekkekandam, MD pgr [phone], cell [phone]    Prenatal History/Complications:  Past Medical History: Past Medical History  Diagnosis Date  . Asthma   . Urinary tract infection     Pyelonephritis during pregnancy  . 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 No past surgeries      Obstetrical History: OB History    Gravida Para Term Preterm AB TAB SAB Ectopic Multiple Living   4 3 3  0 0 0 0 0 0 3      Social History: History   Social History  . Marital Status: Single    Spouse Name: N/A  . Number of Children: N/A  . Years of Education: N/A   Social History Main Topics  . Smoking status: Current Every Day Smoker -- 0.25 packs/day    Types: Cigarettes  . Smokeless tobacco: Never Used  . Alcohol Use: No  . Drug Use: No  . Sexual Activity: Yes   Other Topics Concern  . Not on file   Social History Narrative    Family History: Family History  Problem Relation Age of Onset  . Kidney disease Mother   . Alcohol abuse Neg Hx   . Arthritis Neg Hx   . Asthma Neg Hx   . Birth  defects Neg Hx   . Cancer Neg Hx   . COPD Neg Hx   . Depression Neg Hx   . Drug abuse Neg Hx   . Diabetes Neg Hx   . Early death Neg Hx   . Hearing loss Neg Hx   . Heart disease Neg Hx   . Hyperlipidemia Neg Hx   . Hypertension Neg Hx   . Learning disabilities Neg Hx   . Mental illness Neg Hx   . Mental retardation Neg Hx   . Miscarriages / Stillbirths Neg Hx   . Stroke Neg Hx   . Vision loss Neg Hx   . Varicose Veins Neg Hx     Allergies: No Known Allergies  Prescriptions prior to admission  Medication Sig Dispense Refill Last Dose  . albuterol (PROVENTIL HFA;VENTOLIN HFA) 108 (90 BASE) MCG/ACT inhaler Inhale 2 puffs into the lungs every 4 (four) hours as needed for wheezing or shortness of breath. 1 each 0   . Prenatal Multivit-Min-Fe-FA (PRENATAL VITAMINS) 0.8 MG tablet Take 1 tablet by mouth daily. 30 tablet 9      Review of Systems   All systems reviewed and negative except as stated in HPI  Last menstrual period 01/17/2014. General appearance: alert and cooperative Lungs:  clear to auscultation bilaterally Heart: regular rate and rhythm Abdomen: soft, non-tender; bowel sounds normal Extremities: Homans sign is negative, no sign of DVT Presentation: cephalic  2/70/-3, soft, feels labored  Prenatal labs: ABO, Rh: A/POS/-- (10/14 4098) Antibody: NEG (10/14 0916) Rubella:   RPR: NON REAC (01/26 1406)  HBsAg: NEGATIVE (10/14 0916)  HIV: NONREACTIVE (01/26 1406)  GBS: Detected (03/17 1438)  1 hr Glucola 134 Genetic screening  declined Anatomy US normal   No results found for this or any previous visit (from the past 24 hour(s)).  Patient Active Problem List   Diagnosis Date Noted  . Prolonged pregnancy 11/05/2014  . [redacted] weeks gestation of pregnancy   . Evaluate anatomy not seen on prior sonogram   . Obesity affecting pregnancy in second trimester, antepartum   . Tobacco smoking complicating pregnancy in second trimester   . Supervision of other normal  pregnancy 05/07/2014  . History of mood disorder 06/20/2011  . TOBACCO ABUSE 10/29/2008  . ASTHMA, INTERMITTENT 09/14/2006  . ECZEMA, ATOPIC DERMATITIS 09/14/2006    Assessment: Joanna [name] is a 24 y.o. G4P3003 at [redacted]w[redacted]d here for IOL 2/2 prolonged pregnancy  #Labor: FB placed at 0700, start pitocin #Pain: Epidural upon request #FWB: Cat I #ID:  GBS + => PCN #MOF: bottle #MOC:depo  Dr. Karie Schwalbe also notified of induction, plan.  She will plan to attend delivery if possible  [name], MD  11/05/2014, 7:07 AM

## 2014-11-05 NOTE — Anesthesia Procedure Notes (Addendum)
Epidural Patient location during procedure: OB Start time: 11/05/2014 4:09 PM End time: 11/05/2014 8:08 PM  Staffing Anesthesiologist: Sebastian AcheMANNY, THEODORE Performed by: anesthesiologist   Preanesthetic Checklist Completed: patient identified, site marked, surgical consent, pre-op evaluation, timeout performed, IV checked, risks and benefits discussed and monitors and equipment checked  Epidural Patient position: sitting Prep: site prepped and draped and DuraPrep Patient monitoring: heart rate, continuous pulse ox and blood pressure Approach: midline Location: L2-L3 Injection technique: LOR air  Needle:  Needle type: Tuohy  Needle gauge: 17 G Needle length: 9 cm and 9 Needle insertion depth: 7.5 cm Catheter type: closed end flexible Catheter size: 19 Gauge Test dose: negative  Assessment Events: blood not aspirated, injection not painful, no injection resistance, negative IV test and no paresthesia  Additional Notes   Patient tolerated the insertion well without complications.  Initially LOR was appreciated at L3-L4 at about 7cm, 7ml lido injected but catheter did not want to thread.  Needle inserter at L2-L3 to 7.5cm with LOR and catheter threaded easily.  A total of 9 ml of lidocaine injected at this level.  Pt. Did experience relief of labor pains.  The initial lidocaine injected at L3-L4 was most likely superficial to the epidural space.  Catheter was secured and connected to the epidural pump at 5114ml/hr.Reason for block:procedure for pain  Epidural Patient location during procedure: OB Start time: 11/05/2014 8:20 PM End time: 11/05/2014 8:28 PM  Staffing Anesthesiologist: Leilani AbleHATCHETT, James Lafalce Performed by: anesthesiologist   Preanesthetic Checklist Completed: patient identified, surgical consent, pre-op evaluation, timeout performed, IV checked, risks and benefits discussed and monitors and equipment checked  Epidural Patient position: sitting Prep: site prepped and  draped and DuraPrep Patient monitoring: continuous pulse ox and blood pressure Approach: midline Location: L3-L4 Injection technique: LOR air  Needle:  Needle type: Tuohy  Needle gauge: 17 G Needle length: 9 cm and 9 Needle insertion depth: 8 cm Catheter type: closed end flexible Catheter size: 19 Gauge Catheter at skin depth: 14 cm Test dose: negative and Other  Assessment Sensory level: T9 Events: blood not aspirated, injection not painful, no injection resistance, negative IV test and no paresthesia  Additional Notes Reason for block:procedure for pain

## 2014-11-05 NOTE — Progress Notes (Signed)
Anesthesia notified pt is not comfortable after epidural and currently third PCEA dose infusing.  Requested Anesthesia check level of eepidural

## 2014-11-05 NOTE — Anesthesia Preprocedure Evaluation (Signed)
Anesthesia Evaluation  Patient identified by MRN, date of birth, ID band Patient awake and Patient confused    Reviewed: Allergy & Precautions, H&P , NPO status , Patient's Chart, lab work & pertinent test results  Airway Mallampati: II   Neck ROM: full    Dental  (+) Teeth Intact   Pulmonary asthma , Current Smoker,  breath sounds clear to auscultation  Pulmonary exam normal       Cardiovascular Exercise Tolerance: Good Rhythm:regular Rate:Normal     Neuro/Psych    GI/Hepatic   Endo/Other    Renal/GU      Musculoskeletal   Abdominal Normal abdominal exam  (+)   Peds  Hematology   Anesthesia Other Findings   Reproductive/Obstetrics (+) Pregnancy                             Anesthesia Physical Anesthesia Plan  ASA: II  Anesthesia Plan: Epidural   Post-op Pain Management:    Induction:   Airway Management Planned:   Additional Equipment:   Intra-op Plan:   Post-operative Plan:   Informed Consent:   Plan Discussed with:   Anesthesia Plan Comments:         Anesthesia Quick Evaluation

## 2014-11-05 NOTE — Progress Notes (Signed)
Patient ID: Joanna [name], female   DOB: Oct 27, 1990, 24 y.o.   MRN: [id number] LABOR PROGRESS NOTE  Joanna Roysiffany S Kilker is a 24 y.o. [id number] at 6623w0d admitted for induction of labor due to Post dates. Due date 10/29/14.  Subjective: Pt reports she feels comfortable and is doing well. No questions or concerns.   Objective: BP 132/62 mmHg  Pulse 96  Temp(Src) 97.9 F (36.6 C) (Axillary)  Resp 20  Ht 5\' 4"  (1.626 m)  Wt 97.977 kg (216 lb)  BMI 37.06 kg/m2  LMP 01/17/2014 or  Filed Vitals:   11/05/14 1013 11/05/14 1103 11/05/14 1242 11/05/14 1503  BP: 125/74  136/78 132/62  Pulse: 90  90 96  Temp:  97.9 F (36.6 C)    TempSrc:  Axillary    Resp:      Height:      Weight:           FHT: FHR: 130 bpm, variability: moderate, accelerations: Present, decelerations: Absent UC: regular, every 4-5 minutes Dilation: 4.5 Effacement (%): 80 Cervical Position: Posterior Station: -2 Presentation: Vertex Exam by:: S Grindstaff RN  AROM with amnihook; clear fluid Pitocin @ 3 mu/min  Labs:  Recent Labs    Lab Results  Component Value Date   WBC 13.4* 11/05/2014   HGB 11.0* 11/05/2014   HCT 31.5* 11/05/2014   MCV 88.7 11/05/2014   PLT 304 11/05/2014      Assessment / Plan: Induction of labor due to postterm, progressing well on pitocin  Labor: Progressing on Pitocin, will continue to increase then AROM Fetal Wellbeing: Category I Pain Control: Epidural Anticipated MOD: NSVD

## 2014-11-05 NOTE — Progress Notes (Deleted)
LABOR PROGRESS NOTE  Joanna [name] is a 24 y.o. [id number] at 6338w0d  admitted for induction of labor due to Post dates. Due date 10/29/14.  Subjective: Pt reports she feels comfortable and is doing well. No questions or concerns.   Objective: BP 132/62 mmHg  Pulse 96  Temp(Src) 97.9 F (36.6 C) (Axillary)  Resp 20  Ht 5\' 4"  (1.626 m)  Wt 97.977 kg (216 lb)  BMI 37.06 kg/m2  LMP 01/17/2014 or  Filed Vitals:   11/05/14 1013 11/05/14 1103 11/05/14 1242 11/05/14 1503  BP: 125/74  136/78 132/62  Pulse: 90  90 96  Temp:  97.9 F (36.6 C)    TempSrc:  Axillary    Resp:      Height:      Weight:           FHT:  FHR: 130 bpm, variability: moderate,  accelerations:  Present,  decelerations:  Absent UC:   regular, every 4-5 minutes SVE:   Dilation: 4.5 Effacement (%): 80 Station: -2 Exam by:: Dherr rn  Dilation: 4.5 Effacement (%): 80 Cervical Position: Posterior Station: -2 Presentation: Vertex Exam by:: Dherr rn  Pitocin @ 3 mu/min  Labs: Lab Results  Component Value Date   WBC 13.4* 11/05/2014   HGB 11.0* 11/05/2014   HCT 31.5* 11/05/2014   MCV 88.7 11/05/2014   PLT 304 11/05/2014    Assessment / Plan: Induction of labor due to postterm,  progressing well on pitocin  Labor: Progressing on Pitocin, will continue to increase then AROM Fetal Wellbeing:  Category I Pain Control:  Epidural Anticipated MOD:  NSVD  Joanna [name] 11/05/2014, 3:53 PM

## 2014-11-06 MED ORDER — MISOPROSTOL 200 MCG PO TABS
ORAL_TABLET | ORAL | Status: AC
  Filled 2014-11-06: qty 4

## 2014-11-06 MED ORDER — MISOPROSTOL 200 MCG PO TABS
800.0000 ug | ORAL_TABLET | Freq: Once | ORAL | Status: AC
  Administered 2014-11-06: 800 ug via ORAL

## 2014-11-06 NOTE — Progress Notes (Signed)
At four hour check patient found to have gotten out of bed without assistance. Patient reported no dizziness or difficulty and stated that she was able to perform peri care without assistance.

## 2014-11-06 NOTE — Progress Notes (Signed)
Patient ID: Joanna [name], female   DOB: 1990-10-06, 24 y.o.   MRN: [id number] CTSP for eval of vag bldg. Pt had gotten up on her own to use the bathroom and passed a moderate sized clot on the way, and then had a steady trickle of vag bldg per RN. BP nl, pulse sl tachy up on my arrival FF initially boggy and vag exam produced a lemon-sized clot FF afterwards and lochia nl Cytotec 800mcg PO given Pt stable Minimal bld loss during delivery, so will hold off on AM CBC for now  Cam HaiSHAW, KIMBERLY 11/06/2014 12:42 AM

## 2014-11-06 NOTE — Progress Notes (Signed)
Post Partum Day 1 Subjective: no complaints, up ad lib, voiding, tolerating PO and + flatus  Objective: Blood pressure 102/59, pulse 91, temperature 98.7 F (37.1 C), temperature source Oral, resp. rate 18, height 5\' 4"  (1.626 m), weight 216 lb (97.977 kg), last menstrual period 01/17/2014, SpO2 99 %, unknown if currently breastfeeding.  Physical Exam:  General: alert, cooperative and no distress Lochia: appropriate Uterine Fundus: firm Incision: n/a DVT Evaluation: No evidence of DVT seen on physical exam. Negative Homan's sign. No cords or calf tenderness. No significant calf/ankle edema.   Recent Labs  11/05/14 0707  HGB 11.0*  HCT 31.5*    Assessment/Plan: Plan for discharge tomorrow, Social Work consult and [facility]  breastfeeding   LOS: 1 day   Levert FeinsteinMcIntyre, John Vasconcelos 11/06/2014, 2:07 PM

## 2014-11-06 NOTE — Progress Notes (Signed)
At one hour check nurse found patient standing in the bathroom. Patient has passed a moderate sized clot and reported feeling dizzy. Patient was returned to the bed. On exam her fundus was boggy with a steady trickle of vaginal blood. MD was notified and asked to come to bedside. Dr. Clelia CroftShaw examined pt and ordered cytotec 800mcg PO. Pt reminded to not attempt to get out of bed without the assistance of the nurse. Family is at the bedside at this time.

## 2014-11-06 NOTE — Anesthesia Postprocedure Evaluation (Signed)
  Anesthesia Post-op Note  Patient: Joanna [name]  Procedure(s) Performed: * No procedures listed *  Patient Location: Mother/Baby  Anesthesia Type:Epidural  Level of Consciousness: awake, alert , oriented and patient cooperative  Airway and Oxygen Therapy: Patient Spontanous Breathing  Post-op Pain: none  Post-op Assessment: Post-op Vital signs reviewed, Patient's Cardiovascular Status Stable, Respiratory Function Stable, Patent Airway, No headache, No backache, No residual numbness and No residual motor weakness  Post-op Vital Signs: Reviewed and stable  Last Vitals:  Filed Vitals:   11/06/14 0435  BP: 102/59  Pulse: 91  Temp: 37.1 C  Resp: 18    Complications: No apparent anesthesia complications

## 2014-11-06 NOTE — Progress Notes (Signed)
CLINICAL SOCIAL WORK MATERNAL/CHILD NOTE  Patient Details  Name: Joanna [name] MRN: [id number] Date of Birth: 11/05/2013  Date:  11/06/2014  Clinical Social Worker Initiating Note:  Wane Mollett, LCSW Date/ Time Initiated:  11/06/14/1558     Child's Name:  Joanna [name]   Legal Guardian:  Rendy Kiernan and Gregory [name]  Need for Interpreter:  None   Date of Referral:  11/05/14     Reason for Referral:  History of "Mood Disorder"  Referral Source:  Central Nursery   Address:  [address] [place], [place] [address]  Phone number:  [phone]   Household Members:  MOB reported that she lives in the home with the FOB and her 3 other children (6,5, and 3).  Natural Supports (not living in the home):  Immediate Family- MOB stated that her mother is helping care for other children while she is at the hospital.   Professional Supports: Case Manager/Social Worker: MOB identified Cheyenne Paylor, previous CPS foster care worker as a support.  MOB has previously been seen at [facility] for mental health evaluation.    Employment: Unemployed   Financial Resources:  Medicaid   Other Resources:  Food Stamps , WIC   Cultural/Religious Considerations Which May Impact Care:  None reported  Strengths:  Ability to meet basic needs , Home prepared for child , Pediatrician chosen    Risk Factors/Current Problems:   1) Mental Health Concerns: MOB reported diagnosis of bipolar at age 15/16, but stated that she does not believe it is an accurate diagnosis.  CSW reviewed symptoms of bipolar, MOB unable to identify any symptoms that she has recently experienced.  MOB stated that she was seen at [facility] and another mental health agency (unable to recall name) for mental health evaluation. She is unable to recall their diagnoses/findings.    2) Recent CPS involvement: MOB reported that case was closed in March.   Cognitive State:  Able to Concentrate , Alert , Goal Oriented ,  Insightful , Linear Thinking    Mood/Affect:  Interested , Comfortable , Calm , Bright  CSW noted no acute mental health symptoms.    CSW Assessment: CSW met with MOB due to history of a mood disorder.  MOB presented as easily engaged and receptive to the visit. She was noted to be interacting and bonding with the infant during the entire visit. She acknowledged the reason for the visit, but was a limited historian.   MOB expressed excitement upon the arrival of the infant.  She stated that her other children are also excited and looking forward to having a baby sister.  MOB stated that her other children have not yet visited her, and discussed that they will not likely meet the infant until she returns home.  MOB confirmed that the home is prepared for the infant and that they have all of the infant's basic needs met. MOB reported feeling confident in her ability to transition to caring for 4 children since her other 3 children are in school during the day and are on a "schedule".    MOB acknowledged history of a "mood disorder". She stated that she was diagnosed with bipolar as a teenager, but she is unsure if this is an accurate diagnosis.  MOB was unable to clarify or identify symptoms/events that may have led to the diagnosis. When asked about specific behaviors that may have occurred, she stated that she was unsure and that she "took a test that told me I had bipolar".    She began to discuss recent mental health evaluations pre-pregnancy and during the pregnancy, but she struggled to identify the diagnosis she received or their recommendations.  She shared that each evaluation told her something different.  MOB presented with insight and awareness of need to receive a subsequent mental health evaluation. She stated that she has already discussed with the FOB the need to have another evaluation since the FOB is concerned about her.  MOB then slowly began to discuss that she is irritable, becomes  frustrated with the FOB, and often becomes angry with him.  She denied any other symptoms that may indicate a diagnosis of bipolar, and she was confident that she does not take her anger out on her children.  MOB denied history of perinatal mood disorders, but verbalized understanding of need to closely monitor her mood symptoms as she transitions to the postpartum period.   As MOB discussed recent mental health evaluations, she stated that they were mandated by CPS. MOB shared that she had an open CPS case for more than a year since she and her ex-boyfriend (not the FOB) were "unstable".  She stated that at the time of the initiation of the CPS case, she was homeless.  MOB shared that her children were placed into the physical custody of her grandmother, but that she successfully completed her case plan, her children were returned to her, and her case closed in March.  MOB smiled as she reflected upon her ability to close her CPS case.  She stated that she was able to secure housing, and she now hopes to be able to secure employment now that she is no longer pregnant.  MOB stated that overall it has been a smooth transition since reunification, but that her 23 year old continues to exhibit anger and aggression. She stated that the 23 year old previously participated in therapy, but shared that the child may need to re-start.  She recognized reasons why the child may be angry, and shared belief that she needs to re-start services.  MOB declined CSW offer for referral information since her previous foster care worker has offered to help her if needed. CSW continued to assist the MOB to identify and celebrate her strengths.    MOB denied additional questions, concerns, or needs at this time. She was unable to identify unmet needs, and agreed to contact CSW if she needs additional support or needs arise while at the hospital.  MOB expressed appreciation for the visit.   CSW Plan/Description:   1) CSW contacted  [place] CPS in order to confirm that MOB's children have been returned to her custody and that she does not have an open case.  CPS Intake confirmed that the MOB no longer has an open case, case closed 10/14/13.  2) Information/Referrals: MOB reported desire to return to [facility] for a mental health evaluation in order to receive accurate/updated diagnosis and recommendations for treatment.  MOB reported being familiar with their walk-in clinic and their location/hours.  3) No Further Intervention Required/No Barriers to Discharge  Andrus Sharp N, LCSW 11/06/2014, 4:00 PM  

## 2014-11-06 NOTE — Progress Notes (Signed)
CSW acknowledges consult for history of mood disorder.  CSW attempted to meet with MOB, but she was noted to be sleeping.   CSW will follow-up.  

## 2014-11-07 MED ORDER — IBUPROFEN 600 MG PO TABS: 600.0000 mg | ORAL_TABLET | Freq: Four times a day (QID) | ORAL | Status: DC | PRN

## 2014-11-07 NOTE — Discharge Instructions (Signed)

## 2014-11-07 NOTE — Discharge Summary (Signed)
Obstetric Discharge Summary Reason for Admission: induction of labor Prenatal Procedures: none Intrapartum Procedures: spontaneous vaginal delivery Postpartum Procedures: none Complications-Operative and Postpartum: none HEMOGLOBIN  Date Value Ref Range Status  11/05/2014 11.0* 12.0 - 15.0 g/dL Final   HCT  Date Value Ref Range Status  11/05/2014 31.5* 36.0 - 46.0 % Final    Physical Exam:  General: alert, cooperative and no distress Lochia: appropriate Uterine Fundus: firm Incision: n/a DVT Evaluation: No evidence of DVT seen on physical exam. Negative Homan's sign. No cords or calf tenderness. No significant calf/ankle edema.  Hospital Course: Joanna [name] is a 24 y.o. [phone] at 9263w0d who was admitted to the hospital after presenting for induction of labor for postdates pregnancy. She had a foley bulb placed and received pitocin. She had precipitous second stage of labor with immediate delivery of head after receiving second epidural. Infant has done well after delivery. She had slightly increased bleeding approximately 1 hour after delivery and thus received PO cytotec with no further bleeding issues.   Pt is bottle feeding and plans to use depo for contraception. She will [facility] in [facility] for depo shot and then in 6 weeks for postpartum visit.  Discharge Diagnoses: Term Pregnancy-delivered  Discharge Information: Date: 11/07/2014 Activity: pelvic rest Diet: routine Medications: PNV and Ibuprofen Condition: stable Instructions: refer to practice specific booklet Discharge to: home   Newborn Data: Live born female  Birth Weight: 7 lb 1.1 oz (3205 g) APGAR: 9, 9  Home with mother.  Levert FeinsteinMcIntyre, Brittany 11/07/2014, 9:48 AM   I have seen and examined this patient and agree the above assessment. [name],[name] 11/11/2014 11:28 AM

## 2014-11-10 ENCOUNTER — Ambulatory Visit (INDEPENDENT_AMBULATORY_CARE_PROVIDER_SITE_OTHER): Payer: Medicaid Other | Admitting: *Deleted

## 2014-11-10 DIAGNOSIS — Z3042 Encounter for surveillance of injectable contraceptive: Secondary | ICD-10-CM | POA: Diagnosis present

## 2014-11-10 MED ORDER — MEDROXYPROGESTERONE ACETATE 150 MG/ML IM SUSP
150.0000 mg | Freq: Once | INTRAMUSCULAR | Status: AC
  Administered 2014-11-10: 150 mg via INTRAMUSCULAR

## 2014-11-10 NOTE — Progress Notes (Signed)
   Pt in for Depo Provera injection.  Per Dr. Pollie MeyerMcIntyre order from Pt's discharge summary 11/07/14: [facility].  Pt tolerated Depo injection. Depo given right upper outer quadrant.  Next injection due July 11-February 09, 2015.  Reminder card given. Clovis PuMartin, Gates Jividen L, RN

## 2014-12-19 ENCOUNTER — Ambulatory Visit: Payer: Medicaid Other | Admitting: Family Medicine

## 2015-02-13 ENCOUNTER — Ambulatory Visit (INDEPENDENT_AMBULATORY_CARE_PROVIDER_SITE_OTHER): Payer: Medicaid Other | Admitting: *Deleted

## 2015-02-13 DIAGNOSIS — Z3042 Encounter for surveillance of injectable contraceptive: Secondary | ICD-10-CM

## 2015-02-13 LAB — POCT URINE PREGNANCY: Preg Test, Ur: NEGATIVE

## 2015-02-13 MED ORDER — MEDROXYPROGESTERONE ACETATE 150 MG/ML IM SUSP
150.0000 mg | Freq: Once | INTRAMUSCULAR | Status: AC
  Administered 2015-02-13: 150 mg via INTRAMUSCULAR

## 2015-02-13 NOTE — Progress Notes (Signed)
Patient in today for Depo provera. Patient 4 days late so urine pregnancy obtained with negative result. Injection given in right upper outer quadrant, patient tolerated injection well. Next depo due October 14 - October 28, reminder card given.

## 2015-05-01 ENCOUNTER — Ambulatory Visit (INDEPENDENT_AMBULATORY_CARE_PROVIDER_SITE_OTHER): Payer: Medicaid Other | Admitting: *Deleted

## 2015-05-01 DIAGNOSIS — Z3042 Encounter for surveillance of injectable contraceptive: Secondary | ICD-10-CM

## 2015-05-01 MED ORDER — MEDROXYPROGESTERONE ACETATE 150 MG/ML IM SUSP
150.0000 mg | Freq: Once | INTRAMUSCULAR | Status: AC
  Administered 2015-05-01: 150 mg via INTRAMUSCULAR

## 2015-05-01 NOTE — Progress Notes (Signed)
Patient came in for her depoprovera injection.  Administered in RUOQ and tolerated well.  Next dose 07/17/15-07/31/2015.  Reminder card given to patient.  I also offered patient a flu shot but she states that she plans to get one when her daughter comes in for a visit on 05/08/2015 with Dr. Nancy MarusMayo.  Nurse appt made for her then.  Mesha Schamberger,CMA

## 2015-05-08 ENCOUNTER — Ambulatory Visit: Payer: Medicaid Other

## 2015-05-11 ENCOUNTER — Ambulatory Visit: Payer: Medicaid Other

## 2015-08-25 ENCOUNTER — Ambulatory Visit: Payer: Medicaid Other | Admitting: Internal Medicine

## 2016-04-15 ENCOUNTER — Ambulatory Visit (INDEPENDENT_AMBULATORY_CARE_PROVIDER_SITE_OTHER): Payer: Medicaid Other | Admitting: Internal Medicine

## 2016-04-15 ENCOUNTER — Encounter: Payer: Self-pay | Admitting: Internal Medicine

## 2016-04-15 DIAGNOSIS — L309 Dermatitis, unspecified: Secondary | ICD-10-CM

## 2016-04-15 DIAGNOSIS — J4520 Mild intermittent asthma, uncomplicated: Secondary | ICD-10-CM

## 2016-04-15 MED ORDER — TRIAMCINOLONE ACETONIDE 0.5 % EX OINT: 1.0000 "application " | TOPICAL_OINTMENT | Freq: Two times a day (BID) | CUTANEOUS | 0 refills | Status: DC

## 2016-04-15 MED ORDER — ALBUTEROL SULFATE HFA 108 (90 BASE) MCG/ACT IN AERS: 2.0000 | INHALATION_SPRAY | RESPIRATORY_TRACT | 2 refills | Status: DC | PRN

## 2016-04-15 NOTE — Progress Notes (Signed)
   [facility] Phone: [phone]  Subjective:  Joanna [name] is a 25 year old female presenting to clinic with eczema. She states her eczema worsened about 1 month ago when she was exposed to silicone at work. She subsequently broke out in a rash on her chest and arms. She was seen at the emergency department in [place] and was given prednisone by mouth, Zyrtec, and Triamcinolone cream. She states that these medications helped calm down the allergic reaction. She also noticed that the triamcinolone cream was very helpful for her eczema. She was only prescribed 1 tube of the triamcinolone cream, so she is here requesting a refill of that medication. Since running out of the triamcinolone cream, she has tried over-the-counter hydrocortisone cream, Benadryl cream, and Benadryl by mouth. None of these medications have helped the eczema. She is not using any lotion regularly. She recently switched from unscented Dove soap to an oatmeal almond soap, which has helped her skin. She denies any drainage or spreading redness of the patches of dry skin.  She is also requesting a refill of her albuterol. Her asthma has recently been very well controlled. She has not had any recent visits to the emergency department or hospital admissions. She states that her asthma is usually worse in the winter, so she is trying to make sure she has the medication for when she needs it. She denies any nighttime cough.  ROS: See HPI for pertinent positives and negatives  Past Medical History- asthma, eczema, tobacco use  Family history reviewed for today's visit. No changes.  Social history- patient is a current smoker.  Objective: LMP  (LMP Unknown) Comment: Pt was on depo shot, has not started period back yet Gen: NAD, alert, cooperative with exam Resp: Normal work of breathing Skin: Dry patches of skin noted in the antecubital fossae bilaterally, axilla bilaterally, and chest with overlying  excoriations. No erythema. Skin is overall dry.  Assessment/Plan: Eczema: Uncontrolled with eczematous patches noted in her antecubital fossa, axilla, and chest. No signs of infection. - Advised Pt to use lotion (either Aquaphor or Vaseline) twice a day on all of her skin - Refill provided for Triamcinolone cream to be used on the eczema patches - Follow-up if not improving.  Mild Intermittent Asthma: Well-controlled. Triggers are usually cold air and viral infections. - Refill of Albuterol given - Follow-up as needed   Willadean CarolKaty Mayo, MD PGY-2

## 2016-04-15 NOTE — Patient Instructions (Signed)
It was so nice to see you!  I would recommend using Aquaphor or Vaseline twice a day.  I have prescribed Triamcinolone cream. You can use this twice a day.  I have also refilled your Albuterol.  -Dr. Nancy MarusMayo

## 2016-04-18 NOTE — Assessment & Plan Note (Signed)
Uncontrolled with eczematous patches noted in her antecubital fossa, axilla, and chest. No signs of infection. - Advised Pt to use lotion (either Aquaphor or Vaseline) twice a day on all of her skin - Refill provided for Triamcinolone cream to be used on the eczema patches - Follow-up if not improving.

## 2016-04-18 NOTE — Assessment & Plan Note (Signed)
Well-controlled. Triggers are usually cold air and viral infections. - Refill of Albuterol given - Follow-up as needed

## 2016-07-18 NOTE — L&D Delivery Note (Signed)
Delivery Note After 2 pushes, At  a non-viable malewas delivered via  (Presentation: LOA;  ).  APGAR: 0/0; weight pending. The cord was clamped and cut. 40 units of pitocin diluted in 1000cc LR was infused rapidly IV.  The placenta separated spontaneously and delivered via CCT and maternal pushing effort.  It was inspected and appears to be intact with a 3 VC. 873 ml clots behind placenta.   .     Anesthesia:  epidural Episiotomy:  none Lacerations:  none Suture Repair: na Est. Blood Loss (mL):  1000  Mom to postpartum.  [place]  CRESENZO-DISHMAN,Joanna [name] 07/14/2017, 2:30 AM

## 2016-08-17 ENCOUNTER — Other Ambulatory Visit: Payer: Self-pay | Admitting: Internal Medicine

## 2016-08-17 MED ORDER — TRIAMCINOLONE ACETONIDE 0.5 % EX OINT: 1.0000 "application " | TOPICAL_OINTMENT | Freq: Two times a day (BID) | CUTANEOUS | 0 refills | Status: DC

## 2016-08-17 NOTE — Telephone Encounter (Signed)
Pt needs a refill on triamcinolone. Pt uses [facility] on [address]. ep

## 2016-12-03 ENCOUNTER — Inpatient Hospital Stay (HOSPITAL_COMMUNITY)
Admission: AD | Admit: 2016-12-03 | Discharge: 2016-12-03 | Disposition: A | Discharge status: Home / Self Care | Payer: Medicaid Other | Source: Ambulatory Visit | Attending: Obstetrics and Gynecology | Admitting: Obstetrics and Gynecology

## 2016-12-03 DIAGNOSIS — Z3A00 Weeks of gestation of pregnancy not specified: Secondary | ICD-10-CM | POA: Insufficient documentation

## 2016-12-03 DIAGNOSIS — Z3490 Encounter for supervision of normal pregnancy, unspecified, unspecified trimester: Secondary | ICD-10-CM | POA: Diagnosis not present

## 2016-12-03 DIAGNOSIS — F17210 Nicotine dependence, cigarettes, uncomplicated: Secondary | ICD-10-CM | POA: Diagnosis not present

## 2016-12-03 DIAGNOSIS — Z841 Family history of disorders of kidney and ureter: Secondary | ICD-10-CM | POA: Insufficient documentation

## 2016-12-03 DIAGNOSIS — Z3480 Encounter for supervision of other normal pregnancy, unspecified trimester: Secondary | ICD-10-CM | POA: Diagnosis present

## 2016-12-03 DIAGNOSIS — O99330 Smoking (tobacco) complicating pregnancy, unspecified trimester: Secondary | ICD-10-CM | POA: Diagnosis not present

## 2016-12-03 MED ORDER — IBUPROFEN 600 MG PO TABS: 600.0000 mg | ORAL_TABLET | Freq: Once | ORAL | Status: DC

## 2016-12-03 NOTE — MAU Provider Note (Signed)
History   pt is wanting to get us to see how far along she is. Denies pain, bleeding or any other complaints.  CSN: [id number]  Arrival date & time 12/03/16  1637   None     Chief Complaint  Patient presents with  . Possible Pregnancy    HPI  Past Medical History:  Diagnosis Date  . Asthma    inhaler used 3 months  . 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 Urinary tract infection    Pyelonephritis during pregnancy    Past Surgical History:  Procedure Laterality Date  . NO PAST SURGERIES      Family History  Problem Relation Age of Onset  . Kidney disease Mother   . Alcohol abuse Neg Hx   . Arthritis Neg Hx   . Asthma Neg Hx   . Birth defects Neg Hx   . Cancer Neg Hx   . COPD Neg Hx   . Depression Neg Hx   . Drug abuse Neg Hx   . Diabetes Neg Hx   . Early death Neg Hx   . Hearing loss Neg Hx   . Heart disease Neg Hx   . Hyperlipidemia Neg Hx   . Hypertension Neg Hx   . Learning disabilities Neg Hx   . Mental illness Neg Hx   . Mental retardation Neg Hx   . Miscarriages / Stillbirths Neg Hx   . Stroke Neg Hx   . Vision loss Neg Hx   . Varicose Veins Neg Hx     Social History  Substance Use Topics  . Smoking status: Current Every Day Smoker    Packs/day: 0.50    Years: 2.00    Types: Cigarettes  . Smokeless tobacco: Never Used  . Alcohol use No    OB History    Gravida Para Term Preterm AB Living   4 4 4  0 0 4   SAB TAB Ectopic Multiple Live Births   0 0 0 0 4      Review of Systems  Constitutional: Negative.   HENT: Negative.   Eyes: Negative.   Respiratory: Negative.   Cardiovascular: Negative.   Gastrointestinal: Negative.   Endocrine: Negative.   Genitourinary: Negative.   Musculoskeletal: Negative.   Skin: Negative.     Allergies  Patient has no known allergies.  Home Medications    BP 139/81 (BP Location: Right Arm)   Pulse (!) 117   Temp 98.3 F (36.8 C) (Oral)   Resp 16   LMP 10/27/2016   Physical Exam  Constitutional: She is  oriented to person, place, and time. She appears well-developed and well-nourished.  HENT:  Head: Normocephalic.  Eyes: Pupils are equal, round, and reactive to light.  Neck: Normal range of motion.  Pulmonary/Chest: Effort normal.  Musculoskeletal: Normal range of motion.  Neurological: She is alert and oriented to person, place, and time. She has normal reflexes.  Skin: Skin is warm and dry.  Psychiatric: She has a normal mood and affect. Her behavior is normal. Judgment and thought content normal.    MAU Course  Procedures (including critical care time)  Labs Reviewed - No data to display No results found.   1. Early stage of pregnancy       MDM  Discussed with pt that we would be glad to see her if she was having a problem. she continues to deny any problems. Pt will go to clinic Monday for eval.

## 2016-12-03 NOTE — MAU Note (Signed)
Patient presents with positive home UPT, wants to know how far along she is. Denies vaginal bleeding, no pain.

## 2016-12-05 ENCOUNTER — Encounter: Payer: Self-pay | Admitting: Family Medicine

## 2016-12-05 ENCOUNTER — Ambulatory Visit (INDEPENDENT_AMBULATORY_CARE_PROVIDER_SITE_OTHER): Payer: Medicaid Other | Admitting: Family Medicine

## 2016-12-05 VITALS — BP 110/62 | HR 89 | Temp 98.0°F | Ht 64.0 in | Wt 215.4 lb

## 2016-12-05 DIAGNOSIS — Z3200 Encounter for pregnancy test, result unknown: Secondary | ICD-10-CM | POA: Diagnosis not present

## 2016-12-05 DIAGNOSIS — Z3491 Encounter for supervision of normal pregnancy, unspecified, first trimester: Secondary | ICD-10-CM

## 2016-12-05 DIAGNOSIS — Z3490 Encounter for supervision of normal pregnancy, unspecified, unspecified trimester: Secondary | ICD-10-CM | POA: Diagnosis not present

## 2016-12-05 LAB — POCT URINE PREGNANCY: Preg Test, Ur: POSITIVE — AB

## 2016-12-05 MED ORDER — PRENATAL VITAMINS 0.8 MG PO TABS: 1.0000 | ORAL_TABLET | Freq: Every day | ORAL | 9 refills | Status: DC

## 2016-12-05 NOTE — Progress Notes (Signed)
Subjective:     Patient ID: Joanna [name], female   DOB: 08/28/1990, 26 y.o.   MRN: [id number]  HPI Mrs. Jyl HeinzChavis is a 26yo female presenting today for positive home pregnancy test. Reports positive home pregnancy test this weekend. LMP in April, but unsure of date. Denies medication use except for restarting OTC prenatal vitamins. Requests prescription for prenatal vitamins. Reports she is single, but the father of baby is supportive of her pregnancy. Denies any significant past medical history. Would like an ultrasound to see how far along she is. Denies vaginal bleeding or discharge, contractions, edema. No other acute concerns.  Review of Systems Per HPI    Objective:   Physical Exam  Constitutional: She appears well-developed and well-nourished. No distress.  Cardiovascular: Normal rate.   Pulmonary/Chest: Effort normal. No respiratory distress.  Musculoskeletal: She exhibits no edema.  Psychiatric: She has a normal mood and affect. Her behavior is normal.      Assessment and Plan:     1. First trimester pregnancy Pregnancy test negative. Prenatal vitamins refilled. Will obtain OB labs. Urine was dirty catch and unable to give another sample--will need urinalysis and OB urine culture at first prenatal visit. US ordered for dating, unsure of LMP but believes it was sometime in April. Follow up for first prenatal visit.

## 2016-12-05 NOTE — Patient Instructions (Signed)
First Trimester of Pregnancy The first trimester of pregnancy is from week 1 until the end of week 13 (months 1 through 3). A week after a sperm fertilizes an egg, the egg will implant on the wall of the uterus. This embryo will begin to develop into a baby. Genes from you and your partner will form the baby. The female genes will determine whether the baby will be a boy or a girl. At 6-8 weeks, the eyes and face will be formed, and the heartbeat can be seen on ultrasound. At the end of 12 weeks, all the baby's organs will be formed. Now that you are pregnant, you will want to do everything you can to have a healthy baby. Two of the most important things are to get good prenatal care and to follow your health care provider's instructions. Prenatal care is all the medical care you receive before the baby's birth. This care will help prevent, find, and treat any problems during the pregnancy and childbirth. Body changes during your first trimester Your body goes through many changes during pregnancy. The changes vary from woman to woman.  You may gain or lose a couple of pounds at first.  You may feel sick to your stomach (nauseous) and you may throw up (vomit). If the vomiting is uncontrollable, call your health care provider.  You may tire easily.  You may develop headaches that can be relieved by medicines. All medicines should be approved by your health care provider.  You may urinate more often. Painful urination may mean you have a bladder infection.  You may develop heartburn as a result of your pregnancy.  You may develop constipation because certain hormones are causing the muscles that push stool through your intestines to slow down.  You may develop hemorrhoids or swollen veins (varicose veins).  Your breasts may begin to grow larger and become tender. Your nipples may stick out more, and the tissue that surrounds them (areola) may become darker.  Your gums may bleed and may be  sensitive to brushing and flossing.  Dark spots or blotches (chloasma, mask of pregnancy) may develop on your face. This will likely fade after the baby is born.  Your menstrual periods will stop.  You may have a loss of appetite.  You may develop cravings for certain kinds of food.  You may have changes in your emotions from day to day, such as being excited to be pregnant or being concerned that something may go wrong with the pregnancy and baby.  You may have more vivid and strange dreams.  You may have changes in your hair. These can include thickening of your hair, rapid growth, and changes in texture. Some women also have hair loss during or after pregnancy, or hair that feels dry or thin. Your hair will most likely return to normal after your baby is born.  What to expect at prenatal visits During a routine prenatal visit:  You will be weighed to make sure you and the baby are growing normally.  Your blood pressure will be taken.  Your abdomen will be measured to track your baby's growth.  The fetal heartbeat will be listened to between weeks 10 and 14 of your pregnancy.  Test results from any previous visits will be discussed.  Your health care provider may ask you:  How you are feeling.  If you are feeling the baby move.  If you have had any abnormal symptoms, such as leaking fluid, bleeding, severe headaches,   or abdominal cramping.  If you are using any tobacco products, including cigarettes, chewing tobacco, and electronic cigarettes.  If you have any questions.  Other tests that may be performed during your first trimester include:  Blood tests to find your blood type and to check for the presence of any previous infections. The tests will also be used to check for low iron levels (anemia) and protein on red blood cells (Rh antibodies). Depending on your risk factors, or if you previously had diabetes during pregnancy, you may have tests to check for high blood  sugar that affects pregnant women (gestational diabetes).  Urine tests to check for infections, diabetes, or protein in the urine.  An ultrasound to confirm the proper growth and development of the baby.  Fetal screens for spinal cord problems (spina bifida) and Down syndrome.  HIV (human immunodeficiency virus) testing. Routine prenatal testing includes screening for HIV, unless you choose not to have this test.  You may need other tests to make sure you and the baby are doing well.  Follow these instructions at home: Medicines  Follow your health care provider's instructions regarding medicine use. Specific medicines may be either safe or unsafe to take during pregnancy.  Take a prenatal vitamin that contains at least 600 micrograms (mcg) of folic acid.  If you develop constipation, try taking a stool softener if your health care provider approves. Eating and drinking  Eat a balanced diet that includes fresh fruits and vegetables, whole grains, good sources of protein such as meat, eggs, or tofu, and low-fat dairy. Your health care provider will help you determine the amount of weight gain that is right for you.  Avoid raw meat and uncooked cheese. These carry germs that can cause birth defects in the baby.  Eating four or five small meals rather than three large meals a day may help relieve nausea and vomiting. If you start to feel nauseous, eating a few soda crackers can be helpful. Drinking liquids between meals, instead of during meals, also seems to help ease nausea and vomiting.  Limit foods that are high in fat and processed sugars, such as fried and sweet foods.  To prevent constipation: ? Eat foods that are high in fiber, such as fresh fruits and vegetables, whole grains, and beans. ? Drink enough fluid to keep your urine clear or pale yellow. Activity  Exercise only as directed by your health care provider. Most women can continue their usual exercise routine during  pregnancy. Try to exercise for 30 minutes at least 5 days a week. Exercising will help you: ? Control your weight. ? Stay in shape. ? Be prepared for labor and delivery.  Experiencing pain or cramping in the lower abdomen or lower back is a good sign that you should stop exercising. Check with your health care provider before continuing with normal exercises.  Try to avoid standing for long periods of time. Move your legs often if you must stand in one place for a long time.  Avoid heavy lifting.  Wear low-heeled shoes and practice good posture.  You may continue to have sex unless your health care provider tells you not to. Relieving pain and discomfort  Wear a good support bra to relieve breast tenderness.  Take warm sitz baths to soothe any pain or discomfort caused by hemorrhoids. Use hemorrhoid cream if your health care provider approves.  Rest with your legs elevated if you have leg cramps or low back pain.  If you develop   varicose veins in your legs, wear support hose. Elevate your feet for 15 minutes, 3-4 times a day. Limit salt in your diet. Prenatal care  Schedule your prenatal visits by the twelfth week of pregnancy. They are usually scheduled monthly at first, then more often in the last 2 months before delivery.  Write down your questions. Take them to your prenatal visits.  Keep all your prenatal visits as told by your health care provider. This is important. Safety  Wear your seat belt at all times when driving.  Make a list of emergency phone numbers, including numbers for family, friends, the hospital, and police and fire departments. General instructions  Ask your health care provider for a referral to a local prenatal education class. Begin classes no later than the beginning of month 6 of your pregnancy.  Ask for help if you have counseling or nutritional needs during pregnancy. Your health care provider can offer advice or refer you to specialists for help  with various needs.  Do not use hot tubs, steam rooms, or saunas.  Do not douche or use tampons or scented sanitary pads.  Do not cross your legs for long periods of time.  Avoid cat litter boxes and soil used by cats. These carry germs that can cause birth defects in the baby and possibly loss of the fetus by miscarriage or stillbirth.  Avoid all smoking, herbs, alcohol, and medicines not prescribed by your health care provider. Chemicals in these products affect the formation and growth of the baby.  Do not use any products that contain nicotine or tobacco, such as cigarettes and e-cigarettes. If you need help quitting, ask your health care provider. You may receive counseling support and other resources to help you quit.  Schedule a dentist appointment. At home, brush your teeth with a soft toothbrush and be gentle when you floss. Contact a health care provider if:  You have dizziness.  You have mild pelvic cramps, pelvic pressure, or nagging pain in the abdominal area.  You have persistent nausea, vomiting, or diarrhea.  You have a bad smelling vaginal discharge.  You have pain when you urinate.  You notice increased swelling in your face, hands, legs, or ankles.  You are exposed to fifth disease or chickenpox.  You are exposed to German measles (rubella) and have never had it. Get help right away if:  You have a fever.  You are leaking fluid from your vagina.  You have spotting or bleeding from your vagina.  You have severe abdominal cramping or pain.  You have rapid weight gain or loss.  You vomit blood or material that looks like coffee grounds.  You develop a severe headache.  You have shortness of breath.  You have any kind of trauma, such as from a fall or a car accident. Summary  The first trimester of pregnancy is from week 1 until the end of week 13 (months 1 through 3).  Your body goes through many changes during pregnancy. The changes vary from  woman to woman.  You will have routine prenatal visits. During those visits, your health care provider will examine you, discuss any test results you may have, and talk with you about how you are feeling. This information is not intended to replace advice given to you by your health care provider. Make sure you discuss any questions you have with your health care provider. Document Released: 06/28/2001 Document Revised: 06/15/2016 Document Reviewed: 06/15/2016 Elsevier Interactive Patient Education  2017 Elsevier   Inc.  

## 2016-12-07 LAB — OBSTETRIC PANEL, INCLUDING HIV
ANTIBODY SCREEN: NEGATIVE
HIV SCREEN 4TH GENERATION: NONREACTIVE
Hepatitis B Surface Ag: NEGATIVE
RH TYPE: POSITIVE
RPR Ser Ql: NONREACTIVE
Rubella Antibodies, IGG: 1.52 index (ref >0.99)

## 2016-12-08 ENCOUNTER — Telehealth: Payer: Self-pay | Admitting: Internal Medicine

## 2016-12-08 NOTE — Telephone Encounter (Signed)
Return to work form dropped off  at front desk for completion.  Verified that patient section of form has been completed.  Last DOS/WCC with PCP was 04/15/16.  Placed form in  blue team folder to be completed by clinical staff.  Lina Sarheryl A Stanley

## 2016-12-08 NOTE — Telephone Encounter (Signed)
Form completed and given to Tamika. 

## 2016-12-08 NOTE — Telephone Encounter (Signed)
Form placed in provider's box.  Georgann Bramble,CMA  

## 2016-12-08 NOTE — Telephone Encounter (Signed)
Patient brought return to work form back for Dr Nancy MarusMayo to complete. (needs to initial each requirement listed) placed form in blue team folder

## 2016-12-08 NOTE — Telephone Encounter (Signed)
Left voice message informing patient that is complete and ready for pickup.  Clovis PuMartin, Manmeet Arzola L, RN

## 2016-12-08 NOTE — Telephone Encounter (Signed)
Clinical info completed on return to work form.  Place form in Dr. Enriqueta ShutterMayo's box for completion.  Feliz BeamHARTSELL,  JAZMIN, CMA

## 2016-12-13 NOTE — Telephone Encounter (Signed)
Will complete the form tomorrow. I am out in [place] today, so I won't be in clinic.

## 2016-12-13 NOTE — Telephone Encounter (Signed)
LM for patient.  [name],CMA  

## 2016-12-14 ENCOUNTER — Other Ambulatory Visit: Payer: Self-pay | Admitting: Family Medicine

## 2016-12-14 ENCOUNTER — Ambulatory Visit (HOSPITAL_COMMUNITY)
Admission: RE | Admit: 2016-12-14 | Discharge: 2016-12-14 | Disposition: A | Discharge status: Home / Self Care | Payer: Medicaid Other | Source: Ambulatory Visit | Attending: Family Medicine | Admitting: Family Medicine

## 2016-12-14 DIAGNOSIS — Z3491 Encounter for supervision of normal pregnancy, unspecified, first trimester: Secondary | ICD-10-CM | POA: Diagnosis not present

## 2016-12-14 DIAGNOSIS — Z3490 Encounter for supervision of normal pregnancy, unspecified, unspecified trimester: Secondary | ICD-10-CM

## 2016-12-14 NOTE — Telephone Encounter (Signed)
Pt aware that form is complete.  Pt stated she did not need the form any more.  Form placed up front for pickup, just in case patient changed her mind.  Clovis PuMartin, Haygen Zebrowski L, RN

## 2016-12-19 ENCOUNTER — Other Ambulatory Visit (HOSPITAL_COMMUNITY)
Admission: RE | Admit: 2016-12-19 | Discharge: 2016-12-19 | Disposition: A | Discharge status: Home / Self Care | Payer: Medicaid Other | Source: Ambulatory Visit | Attending: Family Medicine | Admitting: Family Medicine

## 2016-12-19 ENCOUNTER — Ambulatory Visit (INDEPENDENT_AMBULATORY_CARE_PROVIDER_SITE_OTHER): Payer: Medicaid Other | Admitting: Internal Medicine

## 2016-12-19 ENCOUNTER — Encounter: Payer: Self-pay | Admitting: Internal Medicine

## 2016-12-19 VITALS — BP 122/64 | HR 97 | Temp 98.2°F | Wt 217.0 lb

## 2016-12-19 DIAGNOSIS — N898 Other specified noninflammatory disorders of vagina: Secondary | ICD-10-CM | POA: Diagnosis present

## 2016-12-19 DIAGNOSIS — Z3491 Encounter for supervision of normal pregnancy, unspecified, first trimester: Secondary | ICD-10-CM

## 2016-12-19 DIAGNOSIS — Z3A01 Less than 8 weeks gestation of pregnancy: Secondary | ICD-10-CM | POA: Diagnosis not present

## 2016-12-19 DIAGNOSIS — Z3483 Encounter for supervision of other normal pregnancy, third trimester: Secondary | ICD-10-CM | POA: Insufficient documentation

## 2016-12-19 DIAGNOSIS — Z3481 Encounter for supervision of other normal pregnancy, first trimester: Secondary | ICD-10-CM

## 2016-12-19 LAB — POCT WET PREP (WET MOUNT)
Clue Cells Wet Prep Whiff POC: NEGATIVE
Trichomonas Wet Prep HPF POC: ABSENT

## 2016-12-19 NOTE — Patient Instructions (Addendum)
It was so nice to see you!  Your vaginal discharge did not show any signs of bacterial vaginosis or yeast.  We will see you back in 4 weeks for your next OB visit. We will need to do your glucose test at that visit.  -Dr. Nancy MarusMayo

## 2016-12-19 NOTE — Progress Notes (Signed)
Joanna [name] is a 26 y.o. yo G5P4004 at 5710w0d who presents for her initial prenatal visit. Pregnancy is not planned. She reports fatigue and vaginal discharge for 2 weeks. No itchiness, no discomfort, no dysuria. She  is taking PNV. See flow sheet for details.  PMH, POBH, FH, meds, allergies and Social Hx reviewed.  Prenatal Exam: Gen: Well nourished, well developed.  No distress.  Vitals noted. HEENT: Normocephalic, atraumatic.  Neck supple without cervical lymphadenopathy, thyromegaly or thyroid nodules.  Fair dentition. CV: RRR no murmur, gallops or rubs Lungs: CTAB.  Normal respiratory effort without wheezes or rales. Abd: soft, NTND. +BS.  Uterus not appreciated above pelvis. GU: Normal external female genitalia without lesions.  Normal vaginal, well rugated without lesions. Moderate amount of white/yellow vaginal discharge.  Bimanual exam: No adnexal mass or TTP. No CMT.  Uterus size normal. Ext: No clubbing, cyanosis or edema. Psych: Normal grooming and dress.  Not depressed or anxious appearing.  Normal thought content and process without flight of ideas or looseness of associations.  Assessment & Plan: 1) 26 y.o. yo G5P4004 at 3710w0d via early ultrasound doing well.  Current pregnancy issues include none. Dating is reliable. Prenatal labs reviewed and were unremarkable. Unable to obtain CBC with diff at last visit because they were unable to collect enough blood. Will collect today. Pap performed today. Wet prep performed and was negative for yeast or BV. Gonorrhea/chlamydia pending. Likely physiological discharge. Genetic screening offered: patient would like to discuss at next visit. Early glucola is indicated. Patient declines to have this done today and would like to do this next visit. PHQ-9 and Pregnancy Medical Home forms completed and reviewed.  Bleeding and pain precautions reviewed. Importance of prenatal vitamins reviewed.  Follow up in 4 weeks.

## 2016-12-20 LAB — CBC WITH DIFFERENTIAL
Basophils Absolute: 0 10*3/uL (ref 0.0–0.2)
Basos: 0 %
EOS (ABSOLUTE): 0.2 10*3/uL (ref 0.0–0.4)
EOS: 2 %
HEMATOCRIT: 38.8 % (ref 34.0–46.6)
HEMOGLOBIN: 13 g/dL (ref 11.1–15.9)
Immature Grans (Abs): 0 10*3/uL (ref 0.0–0.1)
Immature Granulocytes: 0 %
LYMPHS ABS: 3.5 10*3/uL — AB (ref 0.7–3.1)
Lymphs: 33 %
MCH: 30.4 pg (ref 26.6–33.0)
MCHC: 33.5 g/dL (ref 31.5–35.7)
MCV: 91 fL (ref 79–97)
MONOCYTES: 10 %
MONOS ABS: 1 10*3/uL — AB (ref 0.1–0.9)
NEUTROS ABS: 5.8 10*3/uL (ref 1.4–7.0)
Neutrophils: 55 %
RBC: 4.27 x10E6/uL (ref 3.77–5.28)
RDW: 14.2 % (ref 12.3–15.4)
WBC: 10.5 10*3/uL (ref 3.4–10.8)

## 2016-12-20 LAB — CERVICOVAGINAL ANCILLARY ONLY
Chlamydia: NEGATIVE
NEISSERIA GONORRHEA: NEGATIVE
Trichomonas: NEGATIVE

## 2016-12-20 LAB — CYTOLOGY - PAP
Chlamydia: NEGATIVE
DIAGNOSIS: NEGATIVE
Neisseria Gonorrhea: NEGATIVE
TRICH (WINDOWPATH): NEGATIVE

## 2016-12-21 ENCOUNTER — Telehealth: Payer: Self-pay

## 2016-12-21 NOTE — Telephone Encounter (Signed)
-----   Message from Campbell StallKaty Dodd Mayo, MD sent at 12/21/2016 10:25 AM EDT ----- Please let Ms. Jozwiak know that her labs, including her pap, were completely normal. Thank you!

## 2016-12-21 NOTE — Telephone Encounter (Signed)
Attempted to contact pt, no answer. VM was left for pt to return call. All labs, including pap smear, are normal. Please inform pt.

## 2016-12-28 ENCOUNTER — Other Ambulatory Visit: Payer: Self-pay | Admitting: Internal Medicine

## 2016-12-28 MED ORDER — CETIRIZINE HCL 10 MG PO TABS: 10.0000 mg | ORAL_TABLET | Freq: Every day | ORAL | 11 refills | Status: DC

## 2017-01-19 ENCOUNTER — Encounter: Payer: Medicaid Other | Admitting: Internal Medicine

## 2017-01-26 ENCOUNTER — Ambulatory Visit (INDEPENDENT_AMBULATORY_CARE_PROVIDER_SITE_OTHER): Payer: Medicaid Other | Admitting: Internal Medicine

## 2017-01-26 VITALS — BP 108/54 | HR 89 | Temp 98.8°F | Wt 224.0 lb

## 2017-01-26 DIAGNOSIS — Z3401 Encounter for supervision of normal first pregnancy, first trimester: Secondary | ICD-10-CM

## 2017-01-26 NOTE — Patient Instructions (Addendum)
It was so nice to see you!  We will check another US since your first one was done so early on in your pregnancy. I have also ordered an integrated screen, which is a screening test that looks for signs of Down Syndrome or other chromosomal diseases.  I have also placed an order for your 1 hour glucola test. Please come back in the next 1-2 weeks to have this done!  We will see you back in 4 weeks.  -Dr. Nancy MarusMayo

## 2017-01-26 NOTE — Progress Notes (Signed)
Valerie Roysiffany S Schneiderman is a 26 y.o. [id number] at 1123w3d here for routine follow up.  She reports an episode of vaginal bleeding that occurred last week. She describes the bleeding as "dark brown spotting". The spotting lasted <48 hours. [facility] called [facility], who said that she does not need to come in unless she has increased bleeding or if the bleeding lasts longer than 48 hours. See flow sheet for details.  A/P: Pregnancy at 2323w3d.  Doing well.   Pregnancy issues include maternal obesity. 1 hour glucola is indicated, but patient cannot stay for this today. Future order placed. Patient is interested in genetic screening. Integrated screen ordered. Per chart review, last US performed at 5 weeks was only able to visualize gestational sac. Recommended follow-up US to assess viability. Also, unable to appreciate FHTs today, although patient is only 11 weeks and is obese. US ordered. Bleeding and pain precautions reviewed. Follow up 4 weeks.

## 2017-01-27 NOTE — Addendum Note (Signed)
Addended by: Henri MedalHARTSELL, JAZMIN M on: 01/27/2017 09:51 AM   Modules accepted: Orders

## 2017-01-30 ENCOUNTER — Other Ambulatory Visit (INDEPENDENT_AMBULATORY_CARE_PROVIDER_SITE_OTHER): Payer: Medicaid Other

## 2017-01-30 DIAGNOSIS — Z3401 Encounter for supervision of normal first pregnancy, first trimester: Secondary | ICD-10-CM | POA: Diagnosis not present

## 2017-01-30 LAB — POCT 1 HR PRENATAL GLUCOSE: Glucose 1 Hr Prenatal, POC: 164 mg/dL

## 2017-02-06 ENCOUNTER — Other Ambulatory Visit (INDEPENDENT_AMBULATORY_CARE_PROVIDER_SITE_OTHER): Payer: Medicaid Other

## 2017-02-06 DIAGNOSIS — Z3481 Encounter for supervision of other normal pregnancy, first trimester: Secondary | ICD-10-CM

## 2017-02-06 LAB — POCT CBG (FASTING - GLUCOSE)-MANUAL ENTRY: Glucose Fasting, POC: 104 mg/dL — AB (ref 70–99)

## 2017-02-07 ENCOUNTER — Ambulatory Visit (HOSPITAL_COMMUNITY)
Admission: RE | Admit: 2017-02-07 | Discharge: 2017-02-07 | Disposition: A | Discharge status: Home / Self Care | Payer: Medicaid Other | Source: Ambulatory Visit | Attending: Family Medicine | Admitting: Family Medicine

## 2017-02-07 ENCOUNTER — Encounter (HOSPITAL_COMMUNITY): Payer: Self-pay

## 2017-02-07 ENCOUNTER — Other Ambulatory Visit: Payer: Self-pay | Admitting: Internal Medicine

## 2017-02-07 DIAGNOSIS — O99211 Obesity complicating pregnancy, first trimester: Secondary | ICD-10-CM | POA: Insufficient documentation

## 2017-02-07 DIAGNOSIS — Z3687 Encounter for antenatal screening for uncertain dates: Secondary | ICD-10-CM | POA: Diagnosis not present

## 2017-02-07 DIAGNOSIS — O99331 Smoking (tobacco) complicating pregnancy, first trimester: Secondary | ICD-10-CM | POA: Diagnosis not present

## 2017-02-07 DIAGNOSIS — Z3401 Encounter for supervision of normal first pregnancy, first trimester: Secondary | ICD-10-CM

## 2017-02-07 DIAGNOSIS — Z3491 Encounter for supervision of normal pregnancy, unspecified, first trimester: Secondary | ICD-10-CM

## 2017-02-07 DIAGNOSIS — Z3682 Encounter for antenatal screening for nuchal translucency: Secondary | ICD-10-CM

## 2017-02-07 DIAGNOSIS — Z3A13 13 weeks gestation of pregnancy: Secondary | ICD-10-CM

## 2017-02-07 LAB — GESTATIONAL GLUCOSE TOLERANCE
GLUCOSE 3 HOUR GTT: 55 mg/dL — AB (ref 65–139)
Glucose, Fasting: 89 mg/dL (ref 65–94)
Glucose, GTT - 1 Hour: 152 mg/dL (ref 65–179)
Glucose, GTT - 2 Hour: 114 mg/dL (ref 65–154)

## 2017-02-10 ENCOUNTER — Other Ambulatory Visit: Payer: Self-pay

## 2017-02-27 ENCOUNTER — Encounter: Payer: Medicaid Other | Admitting: Internal Medicine

## 2017-02-28 ENCOUNTER — Ambulatory Visit (INDEPENDENT_AMBULATORY_CARE_PROVIDER_SITE_OTHER): Payer: Medicaid Other | Admitting: Internal Medicine

## 2017-02-28 VITALS — BP 99/58 | HR 91 | Temp 89.2°F | Wt 219.0 lb

## 2017-02-28 DIAGNOSIS — Z3402 Encounter for supervision of normal first pregnancy, second trimester: Secondary | ICD-10-CM

## 2017-02-28 NOTE — Patient Instructions (Signed)
It was so nice to see you!  Your baby looks great today. We have scheduled an ultrasound appointment for you.  We will see you back in 4 weeks!  -Dr. Nancy MarusMayo

## 2017-02-28 NOTE — Progress Notes (Signed)
Valerie Roysiffany S Mcgriff is a 26 y.o. [id number] at 3782w0d here for routine follow up.  She reports no complaints. See flow sheet for details.  A/P: Pregnancy at 2082w0d.  Doing well.   Pregnancy issues include: 1. Maternal obesity- has lost 3lbs since last visit. Encouraged healthy eating and daily exercise. 2. Failed early 1 hour GTT, but passed 3 hour GTT Anatomy ultrasound has been scheduled. Reviewed prenatal labs and OB urine culture was never done. Will order this at next visit. Patient had nuchal translucency testing, which was normal. Not interested in quad screen. Bleeding and pain precautions reviewed. Follow up 4 weeks.

## 2017-03-23 ENCOUNTER — Ambulatory Visit (HOSPITAL_COMMUNITY)
Admission: RE | Admit: 2017-03-23 | Discharge: 2017-03-23 | Disposition: A | Discharge status: Home / Self Care | Payer: Medicaid Other | Source: Ambulatory Visit | Attending: Family Medicine | Admitting: Family Medicine

## 2017-03-23 ENCOUNTER — Other Ambulatory Visit: Payer: Self-pay | Admitting: Internal Medicine

## 2017-03-23 DIAGNOSIS — O99212 Obesity complicating pregnancy, second trimester: Secondary | ICD-10-CM | POA: Diagnosis not present

## 2017-03-23 DIAGNOSIS — Z3689 Encounter for other specified antenatal screening: Secondary | ICD-10-CM | POA: Insufficient documentation

## 2017-03-23 DIAGNOSIS — Z3A19 19 weeks gestation of pregnancy: Secondary | ICD-10-CM | POA: Insufficient documentation

## 2017-03-23 DIAGNOSIS — O99332 Smoking (tobacco) complicating pregnancy, second trimester: Secondary | ICD-10-CM | POA: Diagnosis not present

## 2017-03-23 DIAGNOSIS — Z3402 Encounter for supervision of normal first pregnancy, second trimester: Secondary | ICD-10-CM | POA: Diagnosis present

## 2017-04-13 ENCOUNTER — Encounter: Payer: Medicaid Other | Admitting: Internal Medicine

## 2017-05-11 ENCOUNTER — Encounter: Payer: Medicaid Other | Admitting: Internal Medicine

## 2017-06-07 ENCOUNTER — Ambulatory Visit (INDEPENDENT_AMBULATORY_CARE_PROVIDER_SITE_OTHER): Payer: Medicaid Other | Admitting: Internal Medicine

## 2017-06-07 ENCOUNTER — Other Ambulatory Visit: Payer: Self-pay

## 2017-06-07 VITALS — BP 121/63 | HR 110 | Temp 97.9°F | Wt 221.0 lb

## 2017-06-07 DIAGNOSIS — Z3481 Encounter for supervision of other normal pregnancy, first trimester: Secondary | ICD-10-CM

## 2017-06-07 DIAGNOSIS — Z23 Encounter for immunization: Secondary | ICD-10-CM | POA: Diagnosis not present

## 2017-06-07 DIAGNOSIS — Z3A30 30 weeks gestation of pregnancy: Secondary | ICD-10-CM

## 2017-06-07 MED ORDER — TRIAMCINOLONE ACETONIDE 0.025 % EX OINT: 1.0000 "application " | TOPICAL_OINTMENT | Freq: Two times a day (BID) | CUTANEOUS | 1 refills | Status: DC

## 2017-06-07 NOTE — Progress Notes (Signed)
Joanna [name] is a 26 y.o. [id number] at 616w1d here for routine follow up.  She denies contractions, vaginal bleeding, or LOF. She endorses good fetal movement. See flow sheet for details.  A/P: Pregnancy at 4216w1d.  Doing well.   Pregnancy issues include: 1. Maternal obesity- weight has been stable since July. Encouraged patient to exercise daily. 2. Failed early 1 hour GTT, but passed 3 hour GTT. Patient declined repeat 1 hour GTT today because she didn't have time to have this done. Lab appointment scheduled for 11/26 at 9AM. 3. Limited prenatal care- patient has not been seen in our clinic since she was [redacted] weeks pregnant. 4. Eczema- patient with dry patches on underarms. Requesting refill of Kenalog ointment. Advised patient to use Aquaphor twice daily and refilled Kenalog at lower dose.  Infant feeding choice: both Contraception choice: still undecided, but interested in patch Infant circumcision desired - yes  Tdap was given today. Patient has never had OB urine culture- this was done today. CBC, RPR, and HIV were done today.   Patient declined 1 hour GTT due to time constraints. Lab appointment scheduled for 11/26 at 9AM. Pregnancy medical home and PHQ-9 forms were done today and reviewed.   Rh status was reviewed and patient does not need Rhogam.  Rhogam was not given today.   Childbirth and education classes were offered. Patient declined. Preterm labor and fetal movement precautions reviewed. Follow up 2 weeks in OB clinic.

## 2017-06-07 NOTE — Patient Instructions (Signed)
It was so nice to see you!  I would recommend using Aquaphor on your whole body twice daily. I have also sent Kenalog ointment into your pharmacy. You can use this twice a day on particularly dry skin patches.  Please follow-up in OB clinic in 2 weeks.  -Dr. Nancy MarusMayo

## 2017-06-08 LAB — CBC
HEMOGLOBIN: 12.1 g/dL (ref 11.1–15.9)
Hematocrit: 35.2 % (ref 34.0–46.6)
MCH: 30.4 pg (ref 26.6–33.0)
MCHC: 34.4 g/dL (ref 31.5–35.7)
MCV: 88 fL (ref 79–97)
Platelets: 320 10*3/uL (ref 150–379)
RBC: 3.98 x10E6/uL (ref 3.77–5.28)
RDW: 14.5 % (ref 12.3–15.4)
WBC: 12.5 10*3/uL — ABNORMAL HIGH (ref 3.4–10.8)

## 2017-06-08 LAB — HIV ANTIBODY (ROUTINE TESTING W REFLEX): HIV SCREEN 4TH GENERATION: NONREACTIVE

## 2017-06-08 LAB — RPR: RPR Ser Ql: NONREACTIVE

## 2017-06-12 ENCOUNTER — Other Ambulatory Visit (INDEPENDENT_AMBULATORY_CARE_PROVIDER_SITE_OTHER): Payer: Medicaid Other

## 2017-06-12 DIAGNOSIS — Z3483 Encounter for supervision of other normal pregnancy, third trimester: Secondary | ICD-10-CM | POA: Diagnosis present

## 2017-06-12 LAB — URINE CULTURE, OB REFLEX

## 2017-06-12 LAB — POCT 1 HR PRENATAL GLUCOSE: Glucose 1 Hr Prenatal, POC: 175 mg/dL

## 2017-06-12 LAB — CULTURE, OB URINE

## 2017-06-13 ENCOUNTER — Other Ambulatory Visit (INDEPENDENT_AMBULATORY_CARE_PROVIDER_SITE_OTHER): Payer: Medicaid Other

## 2017-06-13 DIAGNOSIS — Z3483 Encounter for supervision of other normal pregnancy, third trimester: Secondary | ICD-10-CM | POA: Diagnosis not present

## 2017-06-13 LAB — POCT CBG (FASTING - GLUCOSE)-MANUAL ENTRY: Glucose Fasting, POC: 85 mg/dL (ref 70–99)

## 2017-06-14 LAB — GESTATIONAL GLUCOSE TOLERANCE
GLUCOSE 1 HOUR GTT: 159 mg/dL (ref 65–179)
GLUCOSE 2 HOUR GTT: 108 mg/dL (ref 65–154)
GLUCOSE 3 HOUR GTT: 90 mg/dL (ref 65–139)
GLUCOSE FASTING: 75 mg/dL (ref 65–94)

## 2017-06-21 ENCOUNTER — Encounter (HOSPITAL_COMMUNITY): Payer: Self-pay

## 2017-06-21 ENCOUNTER — Emergency Department (HOSPITAL_COMMUNITY)
Admission: EM | Admit: 2017-06-21 | Discharge: 2017-06-22 | Disposition: A | Discharge status: Home / Self Care | Payer: Medicaid Other | Attending: Emergency Medicine | Admitting: Emergency Medicine

## 2017-06-21 ENCOUNTER — Other Ambulatory Visit: Payer: Self-pay

## 2017-06-21 DIAGNOSIS — O210 Mild hyperemesis gravidarum: Secondary | ICD-10-CM | POA: Diagnosis present

## 2017-06-21 DIAGNOSIS — J40 Bronchitis, not specified as acute or chronic: Secondary | ICD-10-CM

## 2017-06-21 DIAGNOSIS — J4521 Mild intermittent asthma with (acute) exacerbation: Secondary | ICD-10-CM

## 2017-06-21 DIAGNOSIS — F17210 Nicotine dependence, cigarettes, uncomplicated: Secondary | ICD-10-CM | POA: Insufficient documentation

## 2017-06-21 DIAGNOSIS — O99513 Diseases of the respiratory system complicating pregnancy, third trimester: Secondary | ICD-10-CM | POA: Diagnosis not present

## 2017-06-21 DIAGNOSIS — O99333 Smoking (tobacco) complicating pregnancy, third trimester: Secondary | ICD-10-CM | POA: Diagnosis not present

## 2017-06-21 DIAGNOSIS — Z3A32 32 weeks gestation of pregnancy: Secondary | ICD-10-CM | POA: Insufficient documentation

## 2017-06-21 NOTE — ED Triage Notes (Signed)
Pt states that she has had a dry nonproductive cough for the past two week and n/v for the past two weeks. Pt is [redacted] weeks pregnant.

## 2017-06-21 NOTE — ED Notes (Signed)
See EDP assessment 

## 2017-06-22 ENCOUNTER — Ambulatory Visit (INDEPENDENT_AMBULATORY_CARE_PROVIDER_SITE_OTHER): Payer: Medicaid Other | Admitting: Family Medicine

## 2017-06-22 ENCOUNTER — Other Ambulatory Visit: Payer: Self-pay

## 2017-06-22 VITALS — BP 126/68 | HR 95 | Temp 98.6°F | Wt 224.4 lb

## 2017-06-22 DIAGNOSIS — J4520 Mild intermittent asthma, uncomplicated: Secondary | ICD-10-CM

## 2017-06-22 DIAGNOSIS — Z3493 Encounter for supervision of normal pregnancy, unspecified, third trimester: Secondary | ICD-10-CM

## 2017-06-22 LAB — CBC WITH DIFFERENTIAL/PLATELET
Basophils Absolute: 0 K/uL (ref 0.0–0.1)
Basophils Relative: 0 %
Eosinophils Absolute: 0.1 K/uL (ref 0.0–0.7)
Eosinophils Relative: 1 %
HCT: 35.7 % — ABNORMAL LOW (ref 36.0–46.0)
Hemoglobin: 12.5 g/dL (ref 12.0–15.0)
Lymphocytes Relative: 19 %
Lymphs Abs: 2.6 K/uL (ref 0.7–4.0)
MCH: 31 pg (ref 26.0–34.0)
MCHC: 35 g/dL (ref 30.0–36.0)
MCV: 88.6 fL (ref 78.0–100.0)
Monocytes Absolute: 1.1 K/uL — ABNORMAL HIGH (ref 0.1–1.0)
Monocytes Relative: 8 %
Neutro Abs: 10 K/uL — ABNORMAL HIGH (ref 1.7–7.7)
Neutrophils Relative %: 72 %
Platelets: 289 K/uL (ref 150–400)
RBC: 4.03 MIL/uL (ref 3.87–5.11)
RDW: 13.8 % (ref 11.5–15.5)
WBC: 13.8 K/uL — ABNORMAL HIGH (ref 4.0–10.5)

## 2017-06-22 LAB — URINALYSIS, ROUTINE W REFLEX MICROSCOPIC
Bilirubin Urine: NEGATIVE
Glucose, UA: NEGATIVE mg/dL
Hgb urine dipstick: NEGATIVE
Ketones, ur: 5 mg/dL — AB
Leukocytes, UA: NEGATIVE
Nitrite: NEGATIVE
Protein, ur: NEGATIVE mg/dL
Specific Gravity, Urine: 1.012 (ref 1.005–1.030)
pH: 6 (ref 5.0–8.0)

## 2017-06-22 LAB — COMPREHENSIVE METABOLIC PANEL WITH GFR
ALT: 10 U/L — ABNORMAL LOW (ref 14–54)
AST: 16 U/L (ref 15–41)
Albumin: 2.5 g/dL — ABNORMAL LOW (ref 3.5–5.0)
Alkaline Phosphatase: 128 U/L — ABNORMAL HIGH (ref 38–126)
Anion gap: 8 (ref 5–15)
BUN: 6 mg/dL (ref 6–20)
CO2: 19 mmol/L — ABNORMAL LOW (ref 22–32)
Calcium: 8.5 mg/dL — ABNORMAL LOW (ref 8.9–10.3)
Chloride: 105 mmol/L (ref 101–111)
Creatinine, Ser: 0.57 mg/dL (ref 0.44–1.00)
GFR calc Af Amer: >60 mL/min
GFR calc non Af Amer: >60 mL/min
Glucose, Bld: 101 mg/dL — ABNORMAL HIGH (ref 65–99)
Potassium: 3.8 mmol/L (ref 3.5–5.1)
Sodium: 132 mmol/L — ABNORMAL LOW (ref 135–145)
Total Bilirubin: 0.5 mg/dL (ref 0.3–1.2)
Total Protein: 6.2 g/dL — ABNORMAL LOW (ref 6.5–8.1)

## 2017-06-22 LAB — LIPASE, BLOOD: Lipase: 21 U/L (ref 11–51)

## 2017-06-22 MED ORDER — PREDNISONE 20 MG PO TABS: 40.0000 mg | ORAL_TABLET | Freq: Every day | ORAL | 0 refills | Status: DC

## 2017-06-22 MED ORDER — ALBUTEROL SULFATE HFA 108 (90 BASE) MCG/ACT IN AERS: 2.0000 | INHALATION_SPRAY | RESPIRATORY_TRACT | 0 refills | Status: DC | PRN

## 2017-06-22 MED ORDER — PROMETHAZINE HCL 25 MG PO TABS: 25.0000 mg | ORAL_TABLET | Freq: Three times a day (TID) | ORAL | 0 refills | Status: DC | PRN

## 2017-06-22 MED ORDER — SODIUM CHLORIDE 0.9 % IV BOLUS (SEPSIS)
1000.0000 mL | Freq: Once | INTRAVENOUS | Status: AC
  Administered 2017-06-22: 1000 mL via INTRAVENOUS

## 2017-06-22 MED ORDER — ONDANSETRON HCL 4 MG PO TABS: 4.0000 mg | ORAL_TABLET | Freq: Four times a day (QID) | ORAL | 0 refills | Status: DC | PRN

## 2017-06-22 MED ORDER — METHYLPREDNISOLONE SODIUM SUCC 125 MG IJ SOLR
125.0000 mg | Freq: Once | INTRAMUSCULAR | Status: AC
  Administered 2017-06-22: 125 mg via INTRAVENOUS
  Filled 2017-06-22: qty 2

## 2017-06-22 MED ORDER — ALBUTEROL SULFATE HFA 108 (90 BASE) MCG/ACT IN AERS: 2.0000 | INHALATION_SPRAY | RESPIRATORY_TRACT | Status: DC | PRN

## 2017-06-22 MED ORDER — IPRATROPIUM-ALBUTEROL 0.5-2.5 (3) MG/3ML IN SOLN
3.0000 mL | Freq: Once | RESPIRATORY_TRACT | Status: AC
Start: 2017-06-22 — End: 2017-06-22
  Administered 2017-06-22: 3 mL via RESPIRATORY_TRACT
  Filled 2017-06-22: qty 3

## 2017-06-22 MED ORDER — AZITHROMYCIN 250 MG PO TABS: 250.0000 mg | ORAL_TABLET | Freq: Every day | ORAL | 0 refills | Status: DC

## 2017-06-22 NOTE — Addendum Note (Signed)
Addended by: Janit PaganENIOLA, Lynn Sissel T on: 06/22/2017 10:20 AM   Modules accepted: Level of Service

## 2017-06-22 NOTE — ED Notes (Signed)
Pt given PO fluids per EDP verbal order. Tolerating everything well. Showing NAD. RR even and unlabored.

## 2017-06-22 NOTE — Progress Notes (Signed)
Joanna [name] is a 26 y.o. [id number] at 747w2d here for routine follow up.  She reports cough, SOB,no wheezing x 2 weeks. She denies sick contact, no fever. She went to the ED last night and got treatment. She received an albuterol breathing treatment and steroids and says that she feels a little better. See flow sheet for details. She has headache occasionally mostly in the afternoon. Uses Tylenol for HA.   A/P: Pregnancy at 537w2d.  Doing well.   Pregnancy issues include Maternal obesity: Patient up 3 lbs since last visit. Is currently 224 up from 221. Maternal Asthma: Had to go to ED on 12/5. Improved with albuterol and prednisone. Patient had apparently run out of albuterol. Refilled albuterol. Last one hour GTT 175. NO gestational diabetes on 3 hour from 11/21 visit. Nausea: Gave 10 pill prn dose of phenergan for nausea. Maternal Tachycardia: Likely due to albuterol, after resting 10 minutes had resolved.  Infant feeding choice: Breastfeeding and bottle feeding Contraception choice: Interested in the patch Infant circumcision desired: Possible circumcision mom will think about circumcision.  Tdap was not given today.  Preterm labor and fetal movement precautions reviewed. Safe sleep discussed. Ultrasound performed in clinic, vertex position. Mom initially with tachycardia up to 120s. Allowed >8115minutes of rest and rechecked and was in high 90s. Will need to keep close eye on HR at subsequent visits. Might need to check thyroid at some point if continued tachycardia. Follow up 2 weeks.  [name] MD PGY-1 Family Medicine Resident

## 2017-06-22 NOTE — Progress Notes (Signed)
[facility]/OB ATTENDING NOTE Joanna Eniola,MD I  have seen and examined this patient, reviewed their chart. I have discussed this patient with the resident. I agree with the resident's findings, assessment and care plan.  Patient evaluated with the resident. No Pregnancy concern today. Recent ED visit for mild to moderate asthma exacerbation. She got treated for this. We refilled her albuterol and gave return precaution. Phenergan prn N/V. At the beginning of the visit, her HR was 120, likely due to recent albuterol use. We rechecked few min later it came down to 95. No murmur on exam, RRR. Pregnancy otherwise progressing well. Labor precaution discussed. Lab result reviewed and discussed with her. F/U with Prenatal PCP in 2 weeks.

## 2017-06-22 NOTE — Progress Notes (Signed)
Pt is a G5P4 at 32wks 2days. [facility] complaining of cough and some vomiting related to the cough. She had a reactive NST while on fetal monitoring for approx 30 minutes. Fetal heart tones 125bpm, multiple 15x15 accelerations, moderate variability and no decelerations (Cat 1 tracing). Dr. Despina HiddenEure was notified, patient OB cleared.

## 2017-06-22 NOTE — ED Provider Notes (Signed)
[facility]ENC[facility] Provider Note   CSN: [id number] Arrival date & time: 06/21/17  2209     History   Chief Complaint Chief Complaint  Patient presents with  . Hyperemesis Gravidarum  . Cough    HPI Joanna [name] is a 26 y.o. female.  Patient presents to the emergency department for evaluation of cough, chest congestion and vomiting.  She reports that symptoms have been ongoing for 2 weeks.  She does report a history of asthma, does not currently have an inhaler.  She reports the cough is been progressively worsening and now she is experiencing vomiting after she coughs.  She has not had any abdominal pain.  She does not have any urinary symptoms.  Patient is [redacted] weeks pregnant.  No abnormal vaginal discharge, fluid rash or vaginal bleeding.      Past Medical History:  Diagnosis Date  . Asthma    inhaler used 3 months  . 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 Urinary tract infection    Pyelonephritis during pregnancy    Patient Active Problem List   Diagnosis Date Noted  . Encounter for supervision of other normal pregnancy, first trimester 12/19/2016  . History of mood disorder 06/20/2011  . TOBACCO ABUSE 10/29/2008  . Mild intermittent asthma 09/14/2006  . Eczema 09/14/2006    Past Surgical History:  Procedure Laterality Date  . HERNIA REPAIR      OB History    Gravida Para Term Preterm AB Living   5 4 4  0 0 4   SAB TAB Ectopic Multiple Live Births   0 0 0 0 4       Home Medications    Prior to Admission medications   Medication Sig Start Date End Date Taking? Authorizing Provider  albuterol (PROVENTIL HFA;VENTOLIN HFA) 108 (90 Base) MCG/ACT inhaler Inhale 2 puffs into the lungs every 4 (four) hours as needed for wheezing or shortness of breath. 04/15/16  Yes Mayo, Allyn KennerKaty Dodd, MD  cholecalciferol (VITAMIN D) 1000 units tablet Take 1,000 Units by mouth daily.   Yes [provider]  triamcinolone (KENALOG) 0.025 % ointment Apply 1  application topically 2 (two) times daily. Patient taking differently: Apply 1 application topically 2 (two) times daily as needed (rash).  06/07/17  Yes Mayo, Allyn KennerKaty Dodd, MD  azithromycin (ZITHROMAX) 250 MG tablet Take 1 tablet (250 mg total) by mouth daily. Take first 2 tablets together, then 1 every day until finished. 06/22/17   Gilda CreasePollina, Joal Eakle J, MD  ondansetron (ZOFRAN) 4 MG tablet Take 1 tablet (4 mg total) by mouth every 6 (six) hours as needed for nausea or vomiting. 06/22/17   Gilda CreasePollina, Analeah Brame J, MD  predniSONE (DELTASONE) 20 MG tablet Take 2 tablets (40 mg total) by mouth daily with breakfast. 06/22/17   Jerae Izard, Canary Brimhristopher J, MD    Family History Family History  Problem Relation Age of Onset  . Kidney disease Mother   . Alcohol abuse Neg Hx   . Arthritis Neg Hx   . Asthma Neg Hx   . Birth defects Neg Hx   . Cancer Neg Hx   . COPD Neg Hx   . Depression Neg Hx   . Drug abuse Neg Hx   . Diabetes Neg Hx   . Early death Neg Hx   . Hearing loss Neg Hx   . Heart disease Neg Hx   . Hyperlipidemia Neg Hx   . Hypertension Neg Hx   . Learning disabilities Neg Hx   .  Mental illness Neg Hx   . Mental retardation Neg Hx   . Miscarriages / Stillbirths Neg Hx   . Stroke Neg Hx   . Vision loss Neg Hx   . Varicose Veins Neg Hx     Social History Social History   Tobacco Use  . Smoking status: Current Every Day Smoker    Packs/day: 0.25    Years: 2.00    Pack years: 0.50    Types: Cigarettes  . Smokeless tobacco: Never Used  Substance Use Topics  . Alcohol use: No  . Drug use: No     Allergies   Patient has no known allergies.   Review of Systems Review of Systems  Constitutional: Negative for fever.  Respiratory: Positive for cough and shortness of breath.   Gastrointestinal: Positive for vomiting.  All other systems reviewed and are negative.    Physical Exam Updated Vital Signs BP 128/76   Pulse 91   Temp 98.8 F (37.1 C) (Oral)   Resp (!) 27    Ht 5\' 6"  (1.676 m)   Wt 101.6 kg (224 lb)   LMP  (LMP Unknown)   SpO2 97%   BMI 36.15 kg/m   Physical Exam  Constitutional: She is oriented to person, place, and time. She appears well-developed and well-nourished. No distress.  HENT:  Head: Normocephalic and atraumatic.  Right Ear: Hearing normal.  Left Ear: Hearing normal.  Nose: Nose normal.  Mouth/Throat: Oropharynx is clear and moist and mucous membranes are normal.  Eyes: Conjunctivae and EOM are normal. Pupils are equal, round, and reactive to light.  Neck: Normal range of motion. Neck supple.  Cardiovascular: Regular rhythm, S1 normal and S2 normal. Exam reveals no gallop and no friction rub.  No murmur heard. Pulmonary/Chest: Effort normal and breath sounds normal. No respiratory distress. She exhibits no tenderness.  Abdominal: Soft. Normal appearance and bowel sounds are normal. There is no hepatosplenomegaly. There is no tenderness. There is no rebound, no guarding, no tenderness at McBurney's point and negative Murphy's sign. No hernia.  Gravid, non-tender  Musculoskeletal: Normal range of motion.  Neurological: She is alert and oriented to person, place, and time. She has normal strength. No cranial nerve deficit or sensory deficit. Coordination normal. GCS eye subscore is 4. GCS verbal subscore is 5. GCS motor subscore is 6.  Skin: Skin is warm, dry and intact. No rash noted. No cyanosis.  Psychiatric: She has a normal mood and affect. Her speech is normal and behavior is normal. Thought content normal.  Nursing note and vitals reviewed.    ED Treatments / Results  Labs (all labs ordered are listed, but only abnormal results are displayed) Labs Reviewed  COMPREHENSIVE METABOLIC PANEL - Abnormal; Notable for the following components:      Result Value   Sodium 132 (*)    CO2 19 (*)    Glucose, Bld 101 (*)    Calcium 8.5 (*)    Total Protein 6.2 (*)    Albumin 2.5 (*)    ALT 10 (*)    Alkaline Phosphatase 128  (*)    All other components within normal limits  CBC WITH DIFFERENTIAL/PLATELET - Abnormal; Notable for the following components:   WBC 13.8 (*)    HCT 35.7 (*)    Neutro Abs 10.0 (*)    Monocytes Absolute 1.1 (*)    All other components within normal limits  LIPASE, BLOOD  URINALYSIS, ROUTINE W REFLEX MICROSCOPIC    EKG  EKG  Interpretation None       Radiology No results found.  Procedures Procedures (including critical care time)  Medications Ordered in ED Medications  sodium chloride 0.9 % bolus 1,000 mL (not administered)  albuterol (PROVENTIL HFA;VENTOLIN HFA) 108 (90 Base) MCG/ACT inhaler 2 puff (not administered)  sodium chloride 0.9 % bolus 1,000 mL (0 mLs Intravenous Stopped 06/22/17 0203)  methylPREDNISolone sodium succinate (SOLU-MEDROL) 125 mg/2 mL injection 125 mg (125 mg Intravenous Given 06/22/17 0122)  ipratropium-albuterol (DUONEB) 0.5-2.5 (3) MG/3ML nebulizer solution 3 mL (3 mLs Nebulization Given 06/22/17 0122)     Initial Impression / Assessment and Plan / ED Course  I have reviewed the triage vital signs and the nursing notes.  Pertinent labs & imaging results that were available during my care of the patient were reviewed by me and considered in my medical decision making (see chart for details).     Patient presents to the emergency department for evaluation of vomiting.  Further discussion with the patient, however, reveals that she is actually having increased cough, chest congestion, shortness of breath, wheezing secondary to asthma.  She appears to have had posttussive emesis, not nausea and vomiting.    Patient was evaluated by OB rapid response nurse.  There is no evidence of fetal distress.  She was cleared from an obstetric standpoint.  Patient did not appear to be significantly dehydrated, was administered IV fluids.  Patient was given a DuoNeb and Solu-Medrol.  She did have some mild bronchospasm on arrival but this is improved.  I was  awaiting urinalysis when patient decided she did not want to be in the ER any longer.  She was informed that pyelonephritis resulting in the vomiting in pregnancy could result in fetal distress or even preterm labor.  She does not wish to wait any longer.  Patient will be discharged AMA with treatment of bronchitis and asthma.  Final Clinical Impressions(s) / ED Diagnoses   Final diagnoses:  Bronchitis  Mild intermittent asthma with acute exacerbation    ED Discharge Orders        Ordered    azithromycin (ZITHROMAX) 250 MG tablet  Daily     06/22/17 0458    predniSONE (DELTASONE) 20 MG tablet  Daily with breakfast     06/22/17 0458    ondansetron (ZOFRAN) 4 MG tablet  Every 6 hours PRN     06/22/17 0458       Gilda CreasePollina, Navneet Schmuck J, MD 06/22/17 [phone]

## 2017-06-22 NOTE — Patient Instructions (Signed)
Today you were seen for an ob check up visit. An ultrasound was performed which showed vertex position. You heart rate was a little elevated so it was double checked and it was ok. We will continue to follow this during your pregnancy. We have sent in a prescription for phenergan, and a refill for your asthma medication. You have a follow up appointment scheduled with dr. Nancy MarusMayo on 12/26 at 9:30AM. Please keep that follow up appointment.

## 2017-06-22 NOTE — ED Notes (Signed)
Paged [facility]; will be here soon

## 2017-07-12 ENCOUNTER — Other Ambulatory Visit: Payer: Self-pay

## 2017-07-12 ENCOUNTER — Ambulatory Visit (INDEPENDENT_AMBULATORY_CARE_PROVIDER_SITE_OTHER): Payer: Medicaid Other | Admitting: Internal Medicine

## 2017-07-12 DIAGNOSIS — O219 Vomiting of pregnancy, unspecified: Secondary | ICD-10-CM | POA: Diagnosis not present

## 2017-07-12 MED ORDER — PROMETHAZINE HCL 25 MG PO TABS: 25.0000 mg | ORAL_TABLET | Freq: Three times a day (TID) | ORAL | 0 refills | Status: DC | PRN

## 2017-07-12 NOTE — Assessment & Plan Note (Signed)
Vomiting 1-2 times each morning. Advised adequate hydration. Refill Phenergan.

## 2017-07-12 NOTE — Progress Notes (Signed)
Joanna [name] is a 26 y.o. [id number] at 6634w1d here for routine follow up.  She reports good fetal movement. No contractions, no vaginal bleeding, no leakage of fluids. Still having a few episodes of vomiting each morning. See flow sheet for details.  A/P: Pregnancy at 6834w1d.  Doing well.   Pregnancy issues include: 1. Maternal obesity- weight has been stable since July. Continue regular exercise. 2. Failed early 1 hour GTT, but passed 3 hour GTT. Also failed 1 hour GTT, but passed 3 hour GTT at 30 weeks. 3. Limited prenatal care- gap in care between 16 weeks and 30 weeks. Patient has been to all appointments since then. 4. Maternal asthma- seen in ED 12/6 for asthma exacerbation. Continue Albuterol prn.  5. Maternal tobacco use- she has cut back from 1/2 ppd to 5-6 cigs per day. Advised that patient continue to try to quit. 6. Nausea/vomiting- vomiting 1-2 times each morning. Phenergan helping- will refill today. 7. Maternal tachycardia- HR 97 today. Likely due to frequent Albuterol use. Will continue to monitor. 8. GBS positive on OB urine culture 11/21- will need intrapartum antibiotics.  Infant feeding choice: both breast and bottle Contraception choice: patch Infant circumcision desired: yes- [facility]  Tdap was given 06/07/17.  GC/CT at next appointment Preterm labor and fetal movement precautions reviewed. Safe sleep discussed. Follow up in 1-2 weeks.

## 2017-07-12 NOTE — Patient Instructions (Signed)
Everything looks great today!  I have sent in a refill for the phenergan into your pharmacy.  We will see you back in 1-2 weeks for your next appointment.  -Dr. Nancy MarusMayo

## 2017-07-13 ENCOUNTER — Inpatient Hospital Stay (HOSPITAL_COMMUNITY)
Admission: AD | Admit: 2017-07-13 | Discharge: 2017-07-14 | DRG: 805 | Disposition: A | Discharge status: Home / Self Care | Payer: Medicaid Other | Source: Ambulatory Visit | Attending: Family Medicine | Admitting: Family Medicine

## 2017-07-13 ENCOUNTER — Other Ambulatory Visit: Payer: Self-pay

## 2017-07-13 ENCOUNTER — Inpatient Hospital Stay (HOSPITAL_COMMUNITY): Payer: Medicaid Other | Admitting: Anesthesiology

## 2017-07-13 ENCOUNTER — Inpatient Hospital Stay (HOSPITAL_COMMUNITY): Payer: Medicaid Other

## 2017-07-13 ENCOUNTER — Encounter (HOSPITAL_COMMUNITY): Payer: Self-pay

## 2017-07-13 DIAGNOSIS — J4520 Mild intermittent asthma, uncomplicated: Secondary | ICD-10-CM | POA: Diagnosis present

## 2017-07-13 DIAGNOSIS — O364XX0 Maternal care for intrauterine death, not applicable or unspecified: Secondary | ICD-10-CM | POA: Diagnosis present

## 2017-07-13 DIAGNOSIS — O99824 Streptococcus B carrier state complicating childbirth: Secondary | ICD-10-CM | POA: Diagnosis present

## 2017-07-13 DIAGNOSIS — O99334 Smoking (tobacco) complicating childbirth: Secondary | ICD-10-CM | POA: Diagnosis present

## 2017-07-13 DIAGNOSIS — O4593 Premature separation of placenta, unspecified, third trimester: Secondary | ICD-10-CM | POA: Diagnosis present

## 2017-07-13 DIAGNOSIS — O9952 Diseases of the respiratory system complicating childbirth: Secondary | ICD-10-CM | POA: Diagnosis present

## 2017-07-13 DIAGNOSIS — F17210 Nicotine dependence, cigarettes, uncomplicated: Secondary | ICD-10-CM | POA: Diagnosis present

## 2017-07-13 DIAGNOSIS — Z3A35 35 weeks gestation of pregnancy: Secondary | ICD-10-CM | POA: Diagnosis not present

## 2017-07-13 DIAGNOSIS — O4693 Antepartum hemorrhage, unspecified, third trimester: Secondary | ICD-10-CM

## 2017-07-13 LAB — CBC
HCT: 27.6 % — ABNORMAL LOW (ref 36.0–46.0)
HEMATOCRIT: 29.5 % — AB (ref 36.0–46.0)
HEMOGLOBIN: 9.5 g/dL — AB (ref 12.0–15.0)
HEMOGLOBIN: 10.1 g/dL — AB (ref 12.0–15.0)
MCH: 30.8 pg (ref 26.0–34.0)
MCH: 31.1 pg (ref 26.0–34.0)
MCHC: 34.4 g/dL (ref 30.0–36.0)
MCHC: 34.2 g/dL (ref 30.0–36.0)
MCV: 89.6 fL (ref 78.0–100.0)
MCV: 90.8 fL (ref 78.0–100.0)
PLATELETS: 269 10*3/uL (ref 150–400)
Platelets: 211 10*3/uL (ref 150–400)
RBC: 3.08 MIL/uL — AB (ref 3.87–5.11)
RBC: 3.25 MIL/uL — AB (ref 3.87–5.11)
RDW: 15 % (ref 11.5–15.5)
RDW: 14.7 % (ref 11.5–15.5)
WBC: 17.2 10*3/uL — ABNORMAL HIGH (ref 4.0–10.5)
WBC: 21.5 10*3/uL — AB (ref 4.0–10.5)

## 2017-07-13 LAB — RAPID URINE DRUG SCREEN, HOSP PERFORMED
AMPHETAMINES: NOT DETECTED
Barbiturates: NOT DETECTED
Benzodiazepines: NOT DETECTED
Cocaine: NOT DETECTED
Opiates: NOT DETECTED
Tetrahydrocannabinol: NOT DETECTED

## 2017-07-13 LAB — URINALYSIS, ROUTINE W REFLEX MICROSCOPIC
GLUCOSE, UA: NEGATIVE mg/dL
HGB URINE DIPSTICK: NEGATIVE
KETONES UR: 5 mg/dL — AB
LEUKOCYTES UA: NEGATIVE
NITRITE: NEGATIVE
PROTEIN: 100 mg/dL — AB
Specific Gravity, Urine: 1.031 — ABNORMAL HIGH (ref 1.005–1.030)
pH: 5 (ref 5.0–8.0)

## 2017-07-13 LAB — PREPARE RBC (CROSSMATCH)

## 2017-07-13 MED ORDER — PHENYLEPHRINE 40 MCG/ML (10ML) SYRINGE FOR IV PUSH (FOR BLOOD PRESSURE SUPPORT)
80.0000 ug | PREFILLED_SYRINGE | INTRAVENOUS | Status: DC | PRN
  Administered 2017-07-13: 80 ug via INTRAVENOUS
  Filled 2017-07-13: qty 10
  Filled 2017-07-13: qty 5

## 2017-07-13 MED ORDER — OXYCODONE-ACETAMINOPHEN 5-325 MG PO TABS: 1.0000 | ORAL_TABLET | ORAL | Status: DC | PRN

## 2017-07-13 MED ORDER — FENTANYL CITRATE (PF) 100 MCG/2ML IJ SOLN
100.0000 ug | INTRAMUSCULAR | Status: DC | PRN
  Administered 2017-07-13: 100 ug via INTRAVENOUS
  Filled 2017-07-13: qty 2

## 2017-07-13 MED ORDER — LACTATED RINGERS IV BOLUS (SEPSIS): 1000.0000 mL | Freq: Once | INTRAVENOUS | Status: DC

## 2017-07-13 MED ORDER — FENTANYL 2.5 MCG/ML BUPIVACAINE 1/10 % EPIDURAL INFUSION (WH - ANES)
14.0000 mL/h | INTRAMUSCULAR | Status: DC | PRN
  Administered 2017-07-14: 14 mL/h via EPIDURAL
  Filled 2017-07-13: qty 100

## 2017-07-13 MED ORDER — DIPHENHYDRAMINE HCL 50 MG/ML IJ SOLN: 12.5000 mg | INTRAMUSCULAR | Status: DC | PRN

## 2017-07-13 MED ORDER — LACTATED RINGERS IV BOLUS (SEPSIS)
1000.0000 mL | Freq: Once | INTRAVENOUS | Status: AC
  Administered 2017-07-13 (×2): 1000 mL via INTRAVENOUS

## 2017-07-13 MED ORDER — OXYCODONE-ACETAMINOPHEN 5-325 MG PO TABS: 2.0000 | ORAL_TABLET | ORAL | Status: DC | PRN

## 2017-07-13 MED ORDER — LIDOCAINE HCL (PF) 1 % IJ SOLN
INTRAMUSCULAR | Status: DC | PRN
Start: 2017-07-13 — End: 2017-07-14
  Administered 2017-07-13 (×2): 4 mL

## 2017-07-13 MED ORDER — BUTORPHANOL TARTRATE 1 MG/ML IJ SOLN
INTRAMUSCULAR | Status: AC
  Filled 2017-07-13: qty 1

## 2017-07-13 MED ORDER — LACTATED RINGERS IV SOLN
INTRAVENOUS | Status: DC
Start: 2017-07-13 — End: 2017-07-14
  Administered 2017-07-13 (×2): via INTRAVENOUS

## 2017-07-13 MED ORDER — OXYTOCIN 40 UNITS IN LACTATED RINGERS INFUSION - SIMPLE MED
2.5000 [IU]/h | INTRAVENOUS | Status: DC
  Administered 2017-07-14: 2.5 [IU]/h via INTRAVENOUS

## 2017-07-13 MED ORDER — SODIUM CHLORIDE 0.9 % IV SOLN: Freq: Once | INTRAVENOUS | Status: DC

## 2017-07-13 MED ORDER — BUTORPHANOL TARTRATE 1 MG/ML IJ SOLN
1.0000 mg | Freq: Once | INTRAMUSCULAR | Status: AC
  Administered 2017-07-13: 1 mg via INTRAVENOUS

## 2017-07-13 MED ORDER — ONDANSETRON HCL 4 MG/2ML IJ SOLN
4.0000 mg | Freq: Four times a day (QID) | INTRAMUSCULAR | Status: DC | PRN
Start: 2017-07-13 — End: 2017-07-14
  Administered 2017-07-13: 4 mg via INTRAVENOUS
  Filled 2017-07-13: qty 2

## 2017-07-13 MED ORDER — LIDOCAINE HCL (PF) 1 % IJ SOLN
30.0000 mL | INTRAMUSCULAR | Status: DC | PRN
Start: 2017-07-13 — End: 2017-07-14
  Filled 2017-07-13: qty 30

## 2017-07-13 MED ORDER — OXYTOCIN 40 UNITS IN LACTATED RINGERS INFUSION - SIMPLE MED
1.0000 m[IU]/min | INTRAVENOUS | Status: DC
  Administered 2017-07-13: 2 m[IU]/min via INTRAVENOUS
  Filled 2017-07-13: qty 1000

## 2017-07-13 MED ORDER — LACTATED RINGERS IV SOLN
500.0000 mL | Freq: Once | INTRAVENOUS | Status: AC
  Administered 2017-07-13: 500 mL via INTRAVENOUS

## 2017-07-13 MED ORDER — LACTATED RINGERS IV BOLUS (SEPSIS)
1000.0000 mL | Freq: Once | INTRAVENOUS | Status: AC
Start: 2017-07-13 — End: 2017-07-13
  Administered 2017-07-13: 1000 mL via INTRAVENOUS

## 2017-07-13 MED ORDER — ACETAMINOPHEN 325 MG PO TABS: 650.0000 mg | ORAL_TABLET | ORAL | Status: DC | PRN

## 2017-07-13 MED ORDER — LACTATED RINGERS IV SOLN
500.0000 mL | INTRAVENOUS | Status: DC | PRN
  Administered 2017-07-13: 500 mL via INTRAVENOUS

## 2017-07-13 MED ORDER — OXYTOCIN BOLUS FROM INFUSION
500.0000 mL | Freq: Once | INTRAVENOUS | Status: AC
  Administered 2017-07-14: 500 mL via INTRAVENOUS

## 2017-07-13 MED ORDER — FENTANYL 2.5 MCG/ML BUPIVACAINE 1/10 % EPIDURAL INFUSION (WH - ANES)
14.0000 mL/h | INTRAMUSCULAR | Status: DC | PRN
  Administered 2017-07-13: 14 mL/h via EPIDURAL
  Filled 2017-07-13: qty 100

## 2017-07-13 MED ORDER — SOD CITRATE-CITRIC ACID 500-334 MG/5ML PO SOLN: 30.0000 mL | ORAL | Status: DC | PRN

## 2017-07-13 MED ORDER — LACTATED RINGERS IV SOLN
INTRAVENOUS | Status: AC
  Administered 2017-07-13: 22:00:00 via INTRAVENOUS

## 2017-07-13 MED ORDER — EPHEDRINE 5 MG/ML INJ
10.0000 mg | INTRAVENOUS | Status: DC | PRN
  Administered 2017-07-13: 10 mg via INTRAVENOUS
  Filled 2017-07-13: qty 2

## 2017-07-13 MED ORDER — PHENYLEPHRINE 40 MCG/ML (10ML) SYRINGE FOR IV PUSH (FOR BLOOD PRESSURE SUPPORT)
80.0000 ug | PREFILLED_SYRINGE | INTRAVENOUS | Status: DC | PRN
  Administered 2017-07-13: 80 ug via INTRAVENOUS
  Filled 2017-07-13: qty 5

## 2017-07-13 MED ORDER — EPHEDRINE 5 MG/ML INJ
10.0000 mg | INTRAVENOUS | Status: DC | PRN
  Filled 2017-07-13: qty 4
  Filled 2017-07-13: qty 2

## 2017-07-13 MED ORDER — BUTORPHANOL TARTRATE 1 MG/ML IJ SOLN
1.0000 mg | Freq: Once | INTRAMUSCULAR | Status: AC
  Administered 2017-07-13: 1 mg via INTRAMUSCULAR
  Filled 2017-07-13: qty 1

## 2017-07-13 NOTE — H&P (Signed)
Obstetric History and Physical  Joanna [name] is a 26 y.o. [id number] with IUP at 8175w2d presenting for vaginal bleeding. No heart tones identified in MAU. Admit for IUFD.  Prenatal Course Source of Care: Family Practice Clinic Pregnancy complications or risks: Patient Active Problem List   Diagnosis Date Noted  . Nausea and vomiting in pregnancy 07/12/2017  . Encounter for supervision of other normal pregnancy, third trimester 12/19/2016  . History of mood disorder 06/20/2011  . TOBACCO ABUSE 10/29/2008  . Mild intermittent asthma 09/14/2006  . Eczema 09/14/2006     Prenatal labs and studies: ABO, Rh: A/Positive/-- (05/21 1441) Antibody: Negative (05/21 1441) Rubella: 1.52 (05/21 1441) RPR: Non Reactive (11/21 1021)  HBsAg: Negative (05/21 1441)  HIV: Non Reactive (11/21 1021)    Past Medical History:  Diagnosis Date  . Asthma    inhaler used 3 months  . 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 Urinary tract infection    Pyelonephritis during pregnancy    Past Surgical History:  Procedure Laterality Date  . HERNIA REPAIR      OB History  Gravida Para Term Preterm AB Living  5 4 4  0 0 4  SAB TAB Ectopic Multiple Live Births  0 0 0 0 4    # Outcome Date GA Lbr Len/2nd Weight Sex Delivery Anes PTL Lv  5 Current           4 Term 11/05/14 3434w0d 13:31 / 00:01 7 lb 1.1 oz (3.205 kg) F Vag-Spont EPI  LIV  3 Term 05/04/11 342w4d 05:12 / 00:47 6 lb 14.4 oz (3.13 kg) M Vag-Spont EPI  LIV  2 Term 08/21/09 3654w1d  7 lb 11 oz (3.487 kg) F Vag-Spont EPI N LIV     Birth Comments: Induction for post-dates   1 Term 07/28/08 6154w0d   F Vag-Spont EPI N LIV      Social History   Socioeconomic History  . Marital status: Single    Spouse name: Not on file  . Number of children: Not on file  . Years of education: Not on file  . Highest education level: Not on file  Social Needs  . Financial resource strain: Not on file  . Food insecurity - worry: Not on file  . Food insecurity - inability: Not on  file  . Transportation needs - medical: Not on file  . Transportation needs - non-medical: Not on file  Occupational History  . Not on file  Tobacco Use  . Smoking status: Current Every Day Smoker    Packs/day: 0.25    Years: 2.00    Pack years: 0.50    Types: Cigarettes  . Smokeless tobacco: Never Used  Substance and Sexual Activity  . Alcohol use: No  . Drug use: No  . Sexual activity: Yes    Birth control/protection: None  Other Topics Concern  . Not on file  Social History Narrative  . Not on file    Family History  Problem Relation Age of Onset  . Kidney disease Mother   . Alcohol abuse Neg Hx   . Arthritis Neg Hx   . Asthma Neg Hx   . Birth defects Neg Hx   . Cancer Neg Hx   . COPD Neg Hx   . Depression Neg Hx   . Drug abuse Neg Hx   . Diabetes Neg Hx   . Early death Neg Hx   . Hearing loss Neg Hx   . Heart disease Neg Hx   .  Hyperlipidemia Neg Hx   . Hypertension Neg Hx   . Learning disabilities Neg Hx   . Mental illness Neg Hx   . Mental retardation Neg Hx   . Miscarriages / Stillbirths Neg Hx   . Stroke Neg Hx   . Vision loss Neg Hx   . Varicose Veins Neg Hx     Medications Prior to Admission  Medication Sig Dispense Refill Last Dose  . albuterol (PROVENTIL HFA;VENTOLIN HFA) 108 (90 Base) MCG/ACT inhaler Inhale 2 puffs into the lungs every 4 (four) hours as needed for wheezing or shortness of breath. 2 Inhaler 0   . cholecalciferol (VITAMIN D) 1000 units tablet Take 1,000 Units by mouth daily.   Past Week at Unknown time  . promethazine (PHENERGAN) 25 MG tablet Take 1 tablet (25 mg total) by mouth every 8 (eight) hours as needed for nausea or vomiting. 15 tablet 0   . triamcinolone (KENALOG) 0.025 % ointment Apply 1 application topically 2 (two) times daily. (Patient taking differently: Apply 1 application topically 2 (two) times daily as needed (rash). ) 30 g 1 unk    No Known Allergies  Review of Systems: Negative except for what is mentioned in  HPI.  Physical Exam:  LMP  (LMP Unknown)  CONSTITUTIONAL: Well-developed, well-nourished female in no acute distress.  HENT:  Normocephalic, atraumatic, External right and left ear normal. Oropharynx is clear and moist EYES: Conjunctivae and EOM are normal. Pupils are equal, round, and reactive to light. No scleral icterus.  NECK: Normal range of motion, supple, no masses SKIN: Skin is warm and dry. No rash noted. Not diaphoretic. No erythema. No pallor. NEUROLOGIC: Alert and oriented to person, place, and time. Normal reflexes, muscle tone coordination. No cranial nerve deficit noted. PSYCHIATRIC: Normal mood and affect. Normal behavior. Normal judgment and thought content. CARDIOVASCULAR: Normal heart rate noted, regular rhythm RESPIRATORY: Effort and breath sounds normal, no problems with respiration noted ABDOMEN: Soft, nontender, nondistended, gravid. MUSCULOSKELETAL: Normal range of motion. No edema and no tenderness. 2+ distal pulses.     Pertinent Labs/Studies:   No results found for this or any previous visit (from the past 24 hour(s)).  Assessment : Joanna [name] is a 26 y.o. [id number] at 3231w2d being admitted for IOL for IUFD. Concern for uterine rupture given low blood pressure (40/20 on admission to MAU). However patient is alert and oriented and minimal to moderate amount of bleeding.   Plan: Admit to L&D. Will rupture to see if blood in fluid.  Labs pending  [employer] Corporationmber Nijae Doyel, DO

## 2017-07-13 NOTE — Progress Notes (Signed)
Pt alert & coherent.

## 2017-07-13 NOTE — Progress Notes (Signed)
Pt presents to MAU via EMS with c/o abdominal pain that began 2 hours ago and VB that began @ approximately 1400.  Upon arrival pt noted to have large amount bright red vaginal bleeding both on clothing and on bed sheets.  Pt c/o light headedness, dizziness  & states "I'm hot, I feel like I'm going to pass out".  Pt transferred into MAU bed and F.Macel Yearsley, RNC assumed her care.  Estanislado SpireE. Lawrence, NP called to bedside.

## 2017-07-13 NOTE — Progress Notes (Addendum)
   Joanna [name] is a 26 y.o. [id number] at 7669w2d  admitted for IUFD/abruption  Subjective: Comfortable w/epidural. Feeling mild pressure  Objective: Vitals:   07/13/17 2131 07/13/17 2141 07/13/17 2151 07/13/17 2201  BP: (!) 107/59 (!) 105/27 112/87 133/70  Pulse: 85 95 89 (!) 106  Resp:      Temp:      TempSrc:      SpO2:      Weight:      Height:       Total I/O In: -  Out: 125 [Urine:125]  Small to moderate amount of dark red bleeding w/clots  UC:   Some UI, ctx q 1.5-3 minutes SVE:   Dilation: 6 Effacement (%): 70 Station: -1 Exam by:: Fransisco BeauKristy Lashley, RN Pitocin @ 6 mu/min  Labs: Lab Results  Component Value Date   WBC 21.5 (H) 07/13/2017   HGB 10.1 (L) 07/13/2017   HCT 29.5 (L) 07/13/2017   MCV 90.8 07/13/2017   PLT 211 07/13/2017    Assessment / Plan: IOL for IUFD, progressing  Labor: Progressing normally Pain Control:  Epidural Anticipated MOD:  NSVD  CRESENZO-DISHMAN,Joanna [name] 07/13/2017, 10:10 PM

## 2017-07-13 NOTE — Anesthesia Preprocedure Evaluation (Signed)
Anesthesia Evaluation  Patient identified by MRN, date of birth, ID band Patient awake and Patient confused    Reviewed: Allergy & Precautions, H&P , NPO status , Patient's Chart, lab work & pertinent test results  Airway Mallampati: II   Neck ROM: full    Dental  (+) Teeth Intact   Pulmonary asthma , Current Smoker,    Pulmonary exam normal breath sounds clear to auscultation       Cardiovascular Exercise Tolerance: Good  Rhythm:regular Rate:Normal     Neuro/Psych PSYCHIATRIC DISORDERS    GI/Hepatic   Endo/Other    Renal/GU      Musculoskeletal   Abdominal Normal abdominal exam  (+) + obese,   Peds  Hematology   Anesthesia Other Findings   Reproductive/Obstetrics (+) Pregnancy                             Anesthesia Physical  Anesthesia Plan  ASA: II  Anesthesia Plan: Epidural   Post-op Pain Management:    Induction:   PONV Risk Score and Plan:   Airway Management Planned:   Additional Equipment:   Intra-op Plan:   Post-operative Plan:   Informed Consent: I have reviewed the patients History and Physical, chart, labs and discussed the procedure including the risks, benefits and alternatives for the proposed anesthesia with the patient or authorized representative who has indicated his/her understanding and acceptance.     Plan Discussed with:   Anesthesia Plan Comments:         Anesthesia Quick Evaluation

## 2017-07-13 NOTE — Progress Notes (Signed)
No FHT heard while trying to apply EFM.  Joanna SpireE. Lawrence, NP @ bedside, stated will perform U/S to verify FHT.

## 2017-07-13 NOTE — Anesthesia Procedure Notes (Signed)
Epidural Patient location during procedure: OB  Staffing Anesthesiologist: Lewie LoronGermeroth, Lynnet Hefley, MD Performed: anesthesiologist   Preanesthetic Checklist Completed: patient identified, pre-op evaluation, timeout performed, IV checked, risks and benefits discussed and monitors and equipment checked  Epidural Patient position: sitting Prep: site prepped and draped and DuraPrep Patient monitoring: heart rate, continuous pulse ox and blood pressure Approach: midline Location: L2-L3 Injection technique: LOR air and LOR saline  Needle:  Needle type: Tuohy  Needle gauge: 17 G Needle length: 9 cm Needle insertion depth: 8 cm Catheter type: closed end flexible Catheter size: 19 Gauge Catheter at skin depth: 13 cm Test dose: negative  Assessment Sensory level: T8 Events: blood not aspirated, injection not painful, no injection resistance, negative IV test and no paresthesia  Additional Notes Time out. Plts. 211. BP stable throughout procedure and dosing of epidural.Reason for block:procedure for pain

## 2017-07-13 NOTE — Progress Notes (Signed)
Dr. Dolan AmenHarroway-Smith at bedside, ultrasound performed using both machines (one in MAU and radiology).  No FHT noted.  Pt informed of fetal demise.  Pt informed a NVD would be attempted considering she's had previous vaginal deliveries.  Pt verbalized understanding and agreeable with POC.

## 2017-07-14 DIAGNOSIS — O364XX0 Maternal care for intrauterine death, not applicable or unspecified: Secondary | ICD-10-CM

## 2017-07-14 DIAGNOSIS — Z3A35 35 weeks gestation of pregnancy: Secondary | ICD-10-CM

## 2017-07-14 LAB — CBC
HEMATOCRIT: 19.7 % — AB (ref 36.0–46.0)
HEMOGLOBIN: 7 g/dL — AB (ref 12.0–15.0)
MCH: 31.7 pg (ref 26.0–34.0)
MCHC: 35.5 g/dL (ref 30.0–36.0)
MCV: 89.1 fL (ref 78.0–100.0)
Platelets: 160 10*3/uL (ref 150–400)
RBC: 2.21 MIL/uL — AB (ref 3.87–5.11)
RDW: 14.8 % (ref 11.5–15.5)
WBC: 15.8 10*3/uL — AB (ref 4.0–10.5)

## 2017-07-14 LAB — RPR: RPR Ser Ql: NONREACTIVE

## 2017-07-14 MED ORDER — MEASLES, MUMPS & RUBELLA VAC ~~LOC~~ INJ
0.5000 mL | INJECTION | Freq: Once | SUBCUTANEOUS | Status: DC
  Filled 2017-07-14: qty 0.5

## 2017-07-14 MED ORDER — COCONUT OIL OIL: 1.0000 "application " | TOPICAL_OIL | Status: DC | PRN

## 2017-07-14 MED ORDER — METHYLERGONOVINE MALEATE 0.2 MG/ML IJ SOLN: 0.2000 mg | INTRAMUSCULAR | Status: DC | PRN

## 2017-07-14 MED ORDER — ACETAMINOPHEN 325 MG PO TABS: 650.0000 mg | ORAL_TABLET | ORAL | Status: DC | PRN

## 2017-07-14 MED ORDER — WITCH HAZEL-GLYCERIN EX PADS: 1.0000 "application " | MEDICATED_PAD | CUTANEOUS | Status: DC | PRN

## 2017-07-14 MED ORDER — SIMETHICONE 80 MG PO CHEW: 80.0000 mg | CHEWABLE_TABLET | ORAL | Status: DC | PRN

## 2017-07-14 MED ORDER — BISACODYL 10 MG RE SUPP: 10.0000 mg | Freq: Every day | RECTAL | Status: DC | PRN

## 2017-07-14 MED ORDER — FERROUS SULFATE 325 (65 FE) MG PO TABS
325.0000 mg | ORAL_TABLET | Freq: Two times a day (BID) | ORAL | Status: DC
  Administered 2017-07-14: 325 mg via ORAL
  Filled 2017-07-14: qty 1

## 2017-07-14 MED ORDER — FERROUS SULFATE 325 (65 FE) MG PO TABS: 325.0000 mg | ORAL_TABLET | Freq: Two times a day (BID) | ORAL | 1 refills | Status: DC

## 2017-07-14 MED ORDER — DIBUCAINE 1 % RE OINT: 1.0000 "application " | TOPICAL_OINTMENT | RECTAL | Status: DC | PRN

## 2017-07-14 MED ORDER — PRENATAL MULTIVITAMIN CH
1.0000 | ORAL_TABLET | Freq: Every day | ORAL | Status: DC
  Administered 2017-07-14: 1 via ORAL
  Filled 2017-07-14: qty 1

## 2017-07-14 MED ORDER — ONDANSETRON HCL 4 MG/2ML IJ SOLN: 4.0000 mg | INTRAMUSCULAR | Status: DC | PRN

## 2017-07-14 MED ORDER — DIPHENHYDRAMINE HCL 25 MG PO CAPS: 25.0000 mg | ORAL_CAPSULE | Freq: Four times a day (QID) | ORAL | Status: DC | PRN

## 2017-07-14 MED ORDER — TETANUS-DIPHTH-ACELL PERTUSSIS 5-2.5-18.5 LF-MCG/0.5 IM SUSP: 0.5000 mL | Freq: Once | INTRAMUSCULAR | Status: DC

## 2017-07-14 MED ORDER — IBUPROFEN 600 MG PO TABS
600.0000 mg | ORAL_TABLET | Freq: Four times a day (QID) | ORAL | Status: DC
  Administered 2017-07-14 (×2): 600 mg via ORAL
  Filled 2017-07-14 (×2): qty 1

## 2017-07-14 MED ORDER — MEDROXYPROGESTERONE ACETATE 150 MG/ML IM SUSP
150.0000 mg | Freq: Once | INTRAMUSCULAR | Status: AC
  Administered 2017-07-14: 150 mg via INTRAMUSCULAR
  Filled 2017-07-14: qty 1

## 2017-07-14 MED ORDER — ONDANSETRON HCL 4 MG PO TABS: 4.0000 mg | ORAL_TABLET | ORAL | Status: DC | PRN

## 2017-07-14 MED ORDER — ZOLPIDEM TARTRATE 5 MG PO TABS: 5.0000 mg | ORAL_TABLET | Freq: Every evening | ORAL | Status: DC | PRN

## 2017-07-14 MED ORDER — DOCUSATE SODIUM 100 MG PO CAPS: 100.0000 mg | ORAL_CAPSULE | Freq: Two times a day (BID) | ORAL | Status: DC

## 2017-07-14 MED ORDER — OXYCODONE HCL 5 MG PO TABS: 10.0000 mg | ORAL_TABLET | ORAL | Status: DC | PRN

## 2017-07-14 MED ORDER — METHYLERGONOVINE MALEATE 0.2 MG PO TABS: 0.2000 mg | ORAL_TABLET | ORAL | Status: DC | PRN

## 2017-07-14 MED ORDER — FLEET ENEMA 7-19 GM/118ML RE ENEM: 1.0000 | ENEMA | Freq: Every day | RECTAL | Status: DC | PRN

## 2017-07-14 MED ORDER — OXYCODONE HCL 5 MG PO TABS: 5.0000 mg | ORAL_TABLET | ORAL | Status: DC | PRN

## 2017-07-14 MED ORDER — BENZOCAINE-MENTHOL 20-0.5 % EX AERO
1.0000 "application " | INHALATION_SPRAY | CUTANEOUS | Status: DC | PRN
  Administered 2017-07-14: 1 via TOPICAL
  Filled 2017-07-14: qty 56

## 2017-07-14 MED ORDER — LACTATED RINGERS IV SOLN: INTRAVENOUS | Status: DC

## 2017-07-14 NOTE — Progress Notes (Signed)
1200- Patient given discharge instructions and verbalizes understanding. Follow up and Fe medication reviewed and pt verbalizes understanding. PP depression previewed

## 2017-07-14 NOTE — Progress Notes (Signed)
Vitals:   07/14/17 0101 07/14/17 0111  BP: 133/64 122/63  Pulse: (!) 111 91  Resp:    Temp:    SpO2:     Output low, IVF infusing at 250cc/hr. Cx 7/100/+1/  Mod dark red bleeding. Anticipate SVD soon.  Pt comfortable

## 2017-07-14 NOTE — Clinical Social Work Note (Signed)
LCSW met with MOB and provided supportive counseling. MOB identified that her boyfriend is her support person.  LCSW contacted chaplain and notified of fetal demise. Chaplain indicated that she would come to MOB's room to address grief/loss issues.   Maire Govan, Clydene Pugh, LCSW

## 2017-07-14 NOTE — Anesthesia Postprocedure Evaluation (Signed)
Anesthesia Post Note  Patient: Mahati S Twaddle  Procedure(s) Performed: AN AD HOC LABOR EPIDURAL     Patient location during evaluation: Other Anesthesia Type: Epidural Level of consciousness: awake and alert Pain management: pain level controlled Vital Signs Assessment: post-procedure vital signs reviewed and stable Respiratory status: spontaneous breathing and respiratory function stable Cardiovascular status: stable Postop Assessment: no headache, no backache, adequate PO intake and no apparent nausea or vomiting Anesthetic complications: no Comments: Patient already discharged home.    Last Vitals:  Vitals:   07/14/17 0524 07/14/17 0625  BP: (!) 113/53 (!) 120/59  Pulse: 80 89  Resp: 18 18  Temp: 37.3 C 37.1 C  SpO2: 100% 100%    Last Pain:  Vitals:   07/14/17 1200  TempSrc:   PainSc: 2    Pain Goal:                 Yamina Lenis

## 2017-07-14 NOTE — Discharge Summary (Signed)
OB Discharge Summary     Patient Name: Joanna [name] DOB: 14-Apr-1991 MRN: [id number] Date of admission: 07/13/2017  Delivering MD: Jacklyn Shell )  Date of discharge: 07/14/2017    Admitting diagnosis: IUFD Intrauterine pregnancy: [redacted]w[redacted]d    Secondary diagnosis:  Active Problems:   Patient Active Problem List   Diagnosis Date Noted  . IUFD at 20 weeks or more of gestation 07/13/2017  . Nausea and vomiting in pregnancy 07/12/2017  . Encounter for supervision of other normal pregnancy, third trimester 12/19/2016  . History of mood disorder 06/20/2011  . TOBACCO ABUSE 10/29/2008  . Mild intermittent asthma 09/14/2006  . Eczema 09/14/2006       Discharge diagnosis: IUFD delivered                                                                                                 Augmentation: Pitocin  Complications: Placental Abruption  Hospital course:  Induction of Labor With Vaginal Delivery   26 y.o. yo [id number] at [redacted]w[redacted]d was admitted to the hospital 07/13/2017 for vaginal bleeding with suspected placental abruption. Baby was found to be non-viable on ultrasound. Induction of labor was started with pitocin. Patient received IV fluid boluses which improved her blood pressure significantly. Hgb was 9.5 on admission and 7.0 on discharge. Patient is asymptomatic and will discharge home on iron. Indication for induction: IUFD.  Patient had a labor course as follows: Membrane Rupture Time/Date: 4:56 PM ,07/13/2017   Intrapartum Procedures: Episiotomy: None [1]                                         Lacerations:  None [1]  Patient had delivery of a Non Viable infant.  Information for the patient's newborn:  Kaina, Orengo [[id number]]  Delivery Method: Vaginal, Spontaneous(Filed from Delivery Summary)   07/14/2017  Details of delivery can be found in separate delivery note.  Patient is discharged home 07/14/17.  Physical exam  Vitals:   07/14/17 0524 07/14/17  0625  BP: (!) 113/53 (!) 120/59  Pulse: 80 89  Resp: 18 18  Temp: 99.1 F (37.3 C) 98.8 F (37.1 C)  SpO2: 100% 100%    General: alert, cooperative and no distress Lochia: appropriate Uterine Fundus: firm Incision: N/A DVT Evaluation: No evidence of DVT seen on physical exam.  Labs: Results for orders placed or performed during the hospital encounter of 07/13/17 (from the past 24 hour(s))  Urinalysis, Routine w reflex microscopic     Status: Abnormal   Collection Time: 07/13/17  3:50 PM  Result Value Ref Range   Color, Urine AMBER (A) YELLOW   APPearance HAZY (A) CLEAR   Specific Gravity, Urine 1.031 (H) 1.005 - 1.030   pH 5.0 5.0 - 8.0   Glucose, UA NEGATIVE NEGATIVE mg/dL   Hgb urine dipstick NEGATIVE NEGATIVE   Bilirubin Urine SMALL (A) NEGATIVE   Ketones, ur 5 (A) NEGATIVE mg/dL   Protein, ur 130 (A) NEGATIVE mg/dL   Nitrite NEGATIVE NEGATIVE  Leukocytes, UA NEGATIVE NEGATIVE   RBC / HPF 0-5 0 - 5 RBC/hpf   WBC, UA 0-5 0 - 5 WBC/hpf   Bacteria, UA RARE (A) NONE SEEN   Squamous Epithelial / LPF 6-30 (A) NONE SEEN   Mucus PRESENT   Urine rapid drug screen (hosp performed)     Status: None   Collection Time: 07/13/17  3:50 PM  Result Value Ref Range   Opiates NONE DETECTED NONE DETECTED   Cocaine NONE DETECTED NONE DETECTED   Benzodiazepines NONE DETECTED NONE DETECTED   Amphetamines NONE DETECTED NONE DETECTED   Tetrahydrocannabinol NONE DETECTED NONE DETECTED   Barbiturates NONE DETECTED NONE DETECTED  Type and screen [facility]     Status: None (Preliminary result)   Collection Time: 07/13/17  4:02 PM  Result Value Ref Range   ABO/RH(D) A POS    Antibody Screen NEG    Sample Expiration 07/16/2017    Unit Number [id number]    Blood Component Type RED CELLS,LR    Unit division 00    Status of Unit ALLOCATED    Transfusion Status OK TO TRANSFUSE    Crossmatch Result Compatible    Unit Number [id number]    Blood Component Type RED  CELLS,LR    Unit division 00    Status of Unit ALLOCATED    Transfusion Status OK TO TRANSFUSE    Crossmatch Result Compatible   CBC     Status: Abnormal   Collection Time: 07/13/17  4:02 PM  Result Value Ref Range   WBC 17.2 (H) 4.0 - 10.5 K/uL   RBC 3.08 (L) 3.87 - 5.11 MIL/uL   Hemoglobin 9.5 (L) 12.0 - 15.0 g/dL   HCT 30.827.6 (L) 65.736.0 - 84.646.0 %   MCV 89.6 78.0 - 100.0 fL   MCH 30.8 26.0 - 34.0 pg   MCHC 34.4 30.0 - 36.0 g/dL   RDW 96.215.0 95.211.5 - 84.115.5 %   Platelets 269 150 - 400 K/uL  Prepare RBC     Status: None   Collection Time: 07/13/17  4:02 PM  Result Value Ref Range   Order Confirmation ORDER PROCESSED BY BLOOD BANK   CBC     Status: Abnormal   Collection Time: 07/13/17  6:12 PM  Result Value Ref Range   WBC 21.5 (H) 4.0 - 10.5 K/uL   RBC 3.25 (L) 3.87 - 5.11 MIL/uL   Hemoglobin 10.1 (L) 12.0 - 15.0 g/dL   HCT 32.429.5 (L) 40.136.0 - 02.746.0 %   MCV 90.8 78.0 - 100.0 fL   MCH 31.1 26.0 - 34.0 pg   MCHC 34.2 30.0 - 36.0 g/dL   RDW 25.314.7 66.411.5 - 40.315.5 %   Platelets 211 150 - 400 K/uL  RPR     Status: None   Collection Time: 07/13/17  6:12 PM  Result Value Ref Range   RPR Ser Ql Non Reactive Non Reactive  CBC     Status: Abnormal   Collection Time: 07/14/17  8:09 AM  Result Value Ref Range   WBC 15.8 (H) 4.0 - 10.5 K/uL   RBC 2.21 (L) 3.87 - 5.11 MIL/uL   Hemoglobin 7.0 (L) 12.0 - 15.0 g/dL   HCT 47.419.7 (L) 25.936.0 - 56.346.0 %   MCV 89.1 78.0 - 100.0 fL   MCH 31.7 26.0 - 34.0 pg   MCHC 35.5 30.0 - 36.0 g/dL   RDW 87.514.8 64.311.5 - 32.915.5 %   Platelets 160 150 - 400 K/uL  Discharge instruction: per After Visit Summary and "Baby and Me Booklet".  After visit meds:  No Known Allergies  Allergies as of 07/14/2017   No Known Allergies     Medication List    STOP taking these medications   promethazine 25 MG tablet Commonly known as:  PHENERGAN   triamcinolone 0.025 % ointment Commonly known as:  KENALOG     TAKE these medications   albuterol 108 (90 Base) MCG/ACT  inhaler Commonly known as:  PROVENTIL HFA;VENTOLIN HFA Inhale 2 puffs into the lungs every 4 (four) hours as needed for wheezing or shortness of breath.   ferrous sulfate 325 (65 FE) MG tablet Take 1 tablet (325 mg total) by mouth 2 (two) times daily with a meal.   prenatal multivitamin Tabs tablet Take 1 tablet by mouth daily at 12 noon.        Diet: routine diet  Activity: Advance as tolerated. Pelvic rest for 6 weeks.  Dispo: home Outpatient follow up:2 weeks Future Appointments: No future appointments.  Follow up Appt: No Follow-up on file.     Postpartum contraception: Depo Provera  Newborn Data: APGAR (1 MIN): 0   APGAR (5 MINS): 0   Disposition:morgue  Amber Moss, DO  07/14/2017    Attestation of Attending Supervision of OB Fellow: Evaluation and management procedures were performed by the [facility] under my supervision and collaboration.  I have reviewed the OB Fellow's note and chart, and I agree with the management and plan.  Baldemar LenisK. Meryl Braylyn Kalter, M.D. Attending Obstetrician & Gynecologist, [facility] [place] [facility]  07/17/2017 9:38 AM

## 2017-07-17 LAB — BPAM RBC
Blood Product Expiration Date: 201901132359
Blood Product Expiration Date: 201901132359
UNIT TYPE AND RH: 6200
UNIT TYPE AND RH: 6200

## 2017-07-17 LAB — TYPE AND SCREEN
ABO/RH(D): A POS
Antibody Screen: NEGATIVE
Unit division: 0
Unit division: 0

## 2017-09-27 ENCOUNTER — Ambulatory Visit (INDEPENDENT_AMBULATORY_CARE_PROVIDER_SITE_OTHER): Payer: Medicaid Other | Admitting: Internal Medicine

## 2017-09-27 ENCOUNTER — Other Ambulatory Visit: Payer: Self-pay

## 2017-09-27 ENCOUNTER — Encounter: Payer: Self-pay | Admitting: Internal Medicine

## 2017-09-27 VITALS — BP 100/70 | HR 93 | Temp 99.1°F | Wt 203.0 lb

## 2017-09-27 DIAGNOSIS — Z3009 Encounter for other general counseling and advice on contraception: Secondary | ICD-10-CM

## 2017-09-27 DIAGNOSIS — Z111 Encounter for screening for respiratory tuberculosis: Secondary | ICD-10-CM

## 2017-09-27 DIAGNOSIS — Z3202 Encounter for pregnancy test, result negative: Secondary | ICD-10-CM

## 2017-09-27 LAB — POCT URINE PREGNANCY: PREG TEST UR: NEGATIVE

## 2017-09-27 MED ORDER — NORELGESTROMIN-ETH ESTRADIOL 150-35 MCG/24HR TD PTWK: 1.0000 | MEDICATED_PATCH | TRANSDERMAL | 12 refills | Status: DC

## 2017-09-27 NOTE — Patient Instructions (Signed)
It was so nice to see you today!  I have prescribed the patch for you. Please change the patch once a week. You should wear it for 3 weeks and then do not wear the patch during the week that you are on your period.  Please schedule an appointment in nursing clinic on Friday, March 15th to have your PPD read.  -Dr. Nancy MarusMayo

## 2017-09-28 NOTE — Assessment & Plan Note (Signed)
Discussed different options. Patient would like to try the patch. Urine pregnancy test negative today. - Ortho evra patch ordered

## 2017-09-28 NOTE — Progress Notes (Signed)
   [facility] Phone: [phone]  Subjective:  Joanna [name] is a 27 year old female presenting to clinic to have paperwork filled out for school and to discuss birth control.  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 Needs vision/hearing, TB test, and forms filled out.  Birth Control: Was just recently pregnant and had an IUFD. Would like to start on birth control. She is sexually active with one female partner. Does not use any protection currently.  ROS: See HPI for pertinent positives and negatives  Past Medical History- recent IUFD at 35 weeks, mild intermittent asthma, eczema   Family history reviewed for today's visit. No changes.  Social history- patient is a current smoker  Objective: BP 100/70   Pulse 93   Temp 99.1 F (37.3 C) (Oral)   Wt 203 lb (92.1 kg)   LMP  (LMP Unknown)   SpO2 98%   Breastfeeding? Unknown   BMI 33.78 kg/m  Gen: NAD, alert, cooperative with exam HEENT: NCAT, EOMI, MMM Neck: FROM, supple CV: RRR, no murmur Resp: CTABL, no wheezes, normal work of breathing GI: SNTND, BS present, no guarding or organomegaly Msk: No edema, warm, normal tone, moves UE/LE spontaneously Neuro: Alert and oriented, no gross deficits Skin: No rashes, no lesions Psych: Appropriate behavior  Assessment/Plan: Paperwork: - Forms filled out for school - Vision and hearing tests were normal - PPD performed today. Patient will return to nursing clinic in 48 hours to have the PPD read  Birth Control Counseling: Discussed different options. Patient would like to try the patch. Urine pregnancy test negative today. - Ortho evra patch ordered   Willadean CarolKaty Glada Wickstrom, MD PGY-3

## 2017-09-29 ENCOUNTER — Ambulatory Visit: Payer: Medicaid Other | Admitting: Family Medicine

## 2017-09-29 DIAGNOSIS — Z3009 Encounter for other general counseling and advice on contraception: Secondary | ICD-10-CM | POA: Diagnosis not present

## 2017-09-29 DIAGNOSIS — Z111 Encounter for screening for respiratory tuberculosis: Secondary | ICD-10-CM

## 2017-09-29 LAB — TB SKIN TEST
Induration: 0 mm
TB Skin Test: NEGATIVE

## 2017-09-29 NOTE — Progress Notes (Signed)
   Patient here today to have PPD site read.   PPD read and results entered in Epic. Result: 0 mm induration. Interpretation: Negative Lynleigh Kovack, RN ([facility] RN)    

## 2017-10-02 NOTE — Progress Notes (Signed)
Scheduled as office visit instead of nurse visit. Will send to preceptor to sign encounter. Ples SpecterAlisa Brake, RN [facility] RN)

## 2017-12-28 ENCOUNTER — Telehealth: Payer: Self-pay | Admitting: Internal Medicine

## 2017-12-28 NOTE — Telephone Encounter (Signed)
Patient informed that letter is ready for pick up.  Pancho Rushing,CMA  

## 2017-12-28 NOTE — Telephone Encounter (Signed)
Pt called and would like to know if she can have a copy of her last Tb test and results left upfront to be picked up. She needs them for a new employer. She had the test done on 09/27/17 and had them read on 09/29/17. She would like to be contacted when they are ready to be picked up. She said she needs them today if possible?

## 2018-07-18 NOTE — L&D Delivery Note (Signed)
  Delivery Note:  [id number] at [redacted]w[redacted]d  Admitting diagnosis: PREG Risks: None Augmentation: AROM and Pitocin ROM: AROM @ 1609  Complete dilation at 05/21/2019  @ 2000 Onset of pushing at 2000 FHR second stage Cat 2 - occasional mild variable decels with ctx  Analgesia Lorriane Shire intrapartum:Epidural  Pushing in lithotomy position CNM, RN, SNM present for birth and supportive  Delivery of a Live born female  Birth Weight:  pending APGAR: 8, 9   Newborn Delivery   Birth date/time: 05/21/2019 20:05:00 Delivery type: Vaginal, Spontaneous      in vertex presentation, position LOA to LOT.  Nuchal Cord: No  Cord double clamped after cessation of pulsation, cut by patient.  Collection of cord blood for typing completed. Cord blood donation-None  Arterial cord blood sample-No    Placenta delivered-Spontaneous  with 3 vessels . Uterotonics: IV Pitocin following delivery of placenta Placenta to L&D to be discarded. Uterine tone firm, bleeding light.  No laceration identified.  Episiotomy:None  Local analgesia: N/A  Repair: None Est. Blood Loss (mL): 177 mL Complications: None  APGAR:1 min- 8, 5 min- 9 10 min-    Mom to postpartum.  Baby to Couplet care / Skin to Skin.  Delivery Report:  Review the Delivery Report for details.     Signed: [name], SNM, BSN 05/21/2019, 8:16 PM

## 2018-10-03 DIAGNOSIS — Z3201 Encounter for pregnancy test, result positive: Secondary | ICD-10-CM | POA: Diagnosis not present

## 2018-11-06 ENCOUNTER — Ambulatory Visit: Payer: Medicaid Other

## 2018-11-08 NOTE — Progress Notes (Deleted)
  Subjective:   Patient ID: Joanna [name]    DOB: [birth year], 28 y.o. female   MRN: [id number]  Joanna [name] is a 28 y.o. female with a history of asthma, tobacco use, eczema here for   Positive preg test - [id number], all vaginal deliveries - Previous IUFD at 35wks due to presumed placental abruption - tobacco***, meds*** - PNV*** - contraception*** - discharge/bleeding***  Review of Systems:  Per HPI.  PMFSH, medications and smoking status reviewed.  Objective:   There were no vitals taken for this visit. Vitals and nursing note reviewed.  General: well nourished, well developed, in no acute distress with non-toxic appearance HEENT: normocephalic, atraumatic, moist mucous membranes Neck: supple, non-tender without lymphadenopathy CV: regular rate and rhythm without murmurs, rubs, or gallops, no lower extremity edema Lungs: clear to auscultation bilaterally with normal work of breathing Abdomen: soft, non-tender, non-distended, no masses or organomegaly palpable, normoactive bowel sounds Skin: warm, dry, no rashes or lesions Extremities: warm and well perfused, normal tone MSK: ROM grossly intact, strength intact, gait normal Neuro: Alert and oriented, speech normal  Assessment & Plan:   No problem-specific Assessment & Plan notes found for this encounter.  No orders of the defined types were placed in this encounter.  No orders of the defined types were placed in this encounter.   Ellwood Dense, DO PGY-2, [facility] 11/08/2018 6:24 PM

## 2018-11-09 ENCOUNTER — Ambulatory Visit: Payer: Medicaid Other

## 2018-11-12 DIAGNOSIS — O208 Other hemorrhage in early pregnancy: Secondary | ICD-10-CM | POA: Diagnosis not present

## 2018-11-12 DIAGNOSIS — Z3A11 11 weeks gestation of pregnancy: Secondary | ICD-10-CM | POA: Diagnosis not present

## 2018-11-12 DIAGNOSIS — Z3A00 Weeks of gestation of pregnancy not specified: Secondary | ICD-10-CM | POA: Diagnosis not present

## 2018-11-12 DIAGNOSIS — O200 Threatened abortion: Secondary | ICD-10-CM | POA: Diagnosis not present

## 2018-11-14 ENCOUNTER — Ambulatory Visit (INDEPENDENT_AMBULATORY_CARE_PROVIDER_SITE_OTHER): Payer: Medicaid Other | Admitting: Family Medicine

## 2018-11-14 ENCOUNTER — Other Ambulatory Visit: Payer: Self-pay

## 2018-11-14 VITALS — BP 108/56 | HR 102

## 2018-11-14 DIAGNOSIS — O0991 Supervision of high risk pregnancy, unspecified, first trimester: Secondary | ICD-10-CM | POA: Diagnosis not present

## 2018-11-14 MED ORDER — PRENATAL VITAMIN 27-0.8 MG PO TABS: 1.0000 | ORAL_TABLET | Freq: Every day | ORAL | 3 refills | Status: DC

## 2018-11-14 NOTE — Progress Notes (Signed)
   CC: positive home preg test, recent ED visit at [facility]  HPI  Positive home pregnancy test in February. Does not recall date of LMP. No previous prenatal care for this episode at all.  Monday morning she noticed some wetness in her underwear and saw blood in the toilet, on TP, and in sheets. Then she went to [facility], they confirmed pregancy with UPT and blood work. They got Hcg level which was high. They did [place] and showed her baby's HR. They told her she had [facility]. She was told to be on pelvic rest. 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 This is a desired pregnancy. Lifts adults by herself. They said she was almost 12 weeks and EDC is 05/30/2019. No more bleeding since Monday.   She is still smoking and trying to quit. She wonders if there is any reason why she had the abruption.   Thinks its a boy because she is not nauseous. She was not naseous with either her son or her female IUFD. She was nauseous with her 3 girls. She reports her first 4 pregnancies were uncomplicated.   ROS: Denies CP, SOB, abdominal pain, dysuria, changes in BMs.   CC, SH/smoking status, and VS noted  Objective: BP (!) 108/56   Pulse (!) 102   SpO2 98%  Gen: NAD, alert, cooperative, and pleasant. HEENT: NCAT, EOMI, PERRL Ext: No edema, warm Neuro: Alert and oriented, Speech clear, No gross deficits  Assessment and plan:  High risk pregnancy: hx of abruption with IUFD makes this a high risk pregnancy. I have requested records from [facility] today and will place in media tab when this arrive via fax. She has what sounds like a [facility] without any additional bleeding since ED episode. She will need high risk OB follow up - referral placed today. rx'd PNV, gave quitline for stopping smoking. Patient given note for work with 15 lb lifting restriction. She was instructed to go to ED/MAU if additional bleeding.   *Received her [facility] records shortly after she left my office - UA with leuks, small hematuria, few  bacteria. No culture obtained in these records. Tried to call patient to get her to come back and leave OB urine culture, no answer. If she calls back, please ask her to give [place] a urine sample in the next day or so and we will treat if needed. Asymptomatic bacteruria in pregnancy is an indication for treatment.  Orders Placed This Encounter  Procedures  . Ambulatory referral to Obstetrics / Gynecology    Referral Priority:   Urgent    Referral Type:   Consultation    Referral Reason:   Specialty Services Required    Requested Specialty:   Obstetrics and Gynecology    Number of Visits Requested:   1    Meds ordered this encounter  Medications  . Prenatal Vit-Fe Fumarate-FA (PRENATAL VITAMIN) 27-0.8 MG TABS    Sig: Take 1 tablet by mouth daily.    Dispense:  90 tablet    Refill:  3      Loni Muse, MD, PGY3 11/14/2018 11:34 AM

## 2018-11-14 NOTE — Patient Instructions (Signed)
Call 1800-QUIT-NOW for help with stopping smoking. They can assist with free resources such as patches, check-in calls, and counseling.   The OB office should call you for your visit.   Take prenatal vitamins. Go to the hospital if any additional bleeding.  Do not lift over 15 lbs.

## 2018-11-16 ENCOUNTER — Telehealth: Payer: Self-pay

## 2018-11-16 NOTE — Telephone Encounter (Signed)
Pharmacist called nurse line stating the prenatal vitamin that was ordered is not in stock. Please send in an alternative. Pharmacist stated they have one similar, however contains 263 of Calcium and OTC. Please advise.

## 2018-11-16 NOTE — Telephone Encounter (Signed)
Called pharmacy and gave verbal for any prenatal with 400IU Folate. They will search around and let us know.

## 2018-11-16 NOTE — Telephone Encounter (Signed)
Any prenatal vitamin with 400IU folate is great. Ok to give verbal order.

## 2018-11-19 NOTE — Telephone Encounter (Signed)
The patient called in to schedule an New OB appointment. Stated she was bleeding last week and it stopped the same day. She stated she visited the ER. An referral was sent [facility] [place]. The patient stated she does not want to go there because its to far. The patient informed me of the days she is available and days off. Accomodated with the next ob appointment. Also educated of new location.

## 2018-11-26 ENCOUNTER — Telehealth: Payer: Self-pay | Admitting: Obstetrics & Gynecology

## 2018-11-26 NOTE — Telephone Encounter (Signed)
Called the patient to confirm the appointment, the patient verbalized understating. °

## 2018-11-29 ENCOUNTER — Ambulatory Visit (INDEPENDENT_AMBULATORY_CARE_PROVIDER_SITE_OTHER): Payer: Medicaid Other | Admitting: Obstetrics and Gynecology

## 2018-11-29 ENCOUNTER — Other Ambulatory Visit: Payer: Self-pay

## 2018-11-29 ENCOUNTER — Encounter: Payer: Self-pay | Admitting: Obstetrics and Gynecology

## 2018-11-29 VITALS — BP 118/72 | HR 90 | Temp 97.9°F | Wt 223.7 lb

## 2018-11-29 DIAGNOSIS — Z8759 Personal history of other complications of pregnancy, childbirth and the puerperium: Secondary | ICD-10-CM | POA: Insufficient documentation

## 2018-11-29 DIAGNOSIS — Z2839 Other underimmunization status: Secondary | ICD-10-CM

## 2018-11-29 DIAGNOSIS — O0990 Supervision of high risk pregnancy, unspecified, unspecified trimester: Secondary | ICD-10-CM

## 2018-11-29 DIAGNOSIS — O9989 Other specified diseases and conditions complicating pregnancy, childbirth and the puerperium: Secondary | ICD-10-CM

## 2018-11-29 DIAGNOSIS — O0991 Supervision of high risk pregnancy, unspecified, first trimester: Secondary | ICD-10-CM

## 2018-11-29 DIAGNOSIS — R8271 Bacteriuria: Secondary | ICD-10-CM

## 2018-11-29 DIAGNOSIS — O09899 Supervision of other high risk pregnancies, unspecified trimester: Secondary | ICD-10-CM

## 2018-11-29 DIAGNOSIS — Z283 Underimmunization status: Secondary | ICD-10-CM

## 2018-11-29 DIAGNOSIS — Z3A14 14 weeks gestation of pregnancy: Secondary | ICD-10-CM

## 2018-11-29 MED ORDER — ASPIRIN EC 81 MG PO TBEC: 81.0000 mg | DELAYED_RELEASE_TABLET | Freq: Every day | ORAL | 2 refills | Status: DC

## 2018-11-29 MED ORDER — AMBULATORY NON FORMULARY MEDICATION: 1.0000 | 0 refills | Status: DC

## 2018-11-29 NOTE — Progress Notes (Signed)
Subjective:    Joanna [name] is a [id number] [redacted]w[redacted]d being seen today for her first obstetrical visit.  Her obstetrical history is significant for history of IUFD at 35 weeks. Patient does intend to breast feed. Pregnancy history fully reviewed.  Patient reports no complaints.  Vitals:   11/29/18 1034  BP: 118/72  Pulse: 90  Temp: 97.9 F (36.6 C)  Weight: 223 lb 11.2 oz (101.5 kg)    HISTORY: OB History  Gravida Para Term Preterm AB Living  0 4  SAB TAB Ectopic Multiple Live Births  0 0 0 0 4    # Outcome Date GA Lbr Len/2nd Weight Sex Delivery Anes PTL Lv  6 Current           5 Preterm 2018    M Vag-Spont   FD  4 Term 11/05/14 [redacted]w[redacted]d 13:31 / 00:01 7 lb 1.1 oz (3.205 kg) F Vag-Spont EPI  LIV  3 Term 05/04/11 [redacted]w[redacted]d 05:12 / 00:47 6 lb 14.4 oz (3.13 kg) M Vag-Spont EPI  LIV  2 Term 08/21/09 [redacted]w[redacted]d  7 lb 11 oz (3.487 kg) F Vag-Spont EPI N LIV     Birth Comments: Induction for post-dates   1 Term 07/28/08 [redacted]w[redacted]d   F Vag-Spont EPI N LIV   Past Medical History:  Diagnosis Date  . Asthma    inhaler used 3 months  . 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 Urinary tract infection    Pyelonephritis during pregnancy   Past Surgical History:  Procedure Laterality Date  . HERNIA REPAIR     Family History  Problem Relation Age of Onset  . Kidney disease Mother   . Alcohol abuse Neg Hx   . Arthritis Neg Hx   . Asthma Neg Hx   . Birth defects Neg Hx   . Cancer Neg Hx   . COPD Neg Hx   . Depression Neg Hx   . Drug abuse Neg Hx   . Diabetes Neg Hx   . Early death Neg Hx   . Hearing loss Neg Hx   . Heart disease Neg Hx   . Hyperlipidemia Neg Hx   . Hypertension Neg Hx   . Learning disabilities Neg Hx   . Mental illness Neg Hx   . Mental retardation Neg Hx   . Miscarriages / Stillbirths Neg Hx   . Stroke Neg Hx   . Vision loss Neg Hx   . Varicose Veins Neg Hx      Exam    Uterus:   16-weeks  Pelvic Exam:    Perineum: No Hemorrhoids, Normal Perineum   Vulva: normal   Vagina:  normal mucosa, normal discharge   pH:    Cervix: nulliparous appearance and cervix is closed and long   Adnexa: no mass, fullness, tenderness   Bony Pelvis: gynecoid  System: Breast:  normal appearance, no masses or tenderness   Skin: normal coloration and turgor, no rashes    Neurologic: oriented, no focal deficits   Extremities: normal strength, tone, and muscle mass   HEENT extra ocular movement intact   Mouth/Teeth mucous membranes moist, pharynx normal without lesions and dental hygiene good   Neck supple and no masses   Cardiovascular: regular rate and rhythm   Respiratory:  appears well, vitals normal, no respiratory distress, acyanotic, normal RR, ear and throat exam is normal, chest clear, no wheezing, crepitations, rhonchi, normal symmetric air entry   Abdomen: soft, non-tender; bowel sounds normal; no masses,  no organomegaly   Urinary:       Assessment:    Pregnancy: [id number] Patient Active Problem List   Diagnosis Date Noted  . Supervision of high risk pregnancy, antepartum 11/29/2018  . History of IUFD 11/29/2018  . TOBACCO ABUSE 10/29/2008  . Mild intermittent asthma 09/14/2006  . Eczema 09/14/2006        Plan:     Initial labs drawn. Prenatal vitamins. Problem list reviewed and updated. Genetic Screening discussed : panorama ordered.  Ultrasound discussed; fetal survey: ordered. Rx ASA provided  Follow up in 4 weeks. 50% of 30 min visit spent on counseling and coordination of care.     Sahib Pella 11/29/2018

## 2018-11-29 NOTE — Progress Notes (Signed)
Pt made aware of ultrasound appointment for June 18 @ 10am.

## 2018-11-29 NOTE — Addendum Note (Signed)
Addended by: Gwendlyn Deutscher A on: 11/29/2018 11:19 AM   Modules accepted: Orders

## 2018-11-29 NOTE — Patient Instructions (Signed)
° °Second Trimester of Pregnancy °The second trimester is from week 14 through week 27 (months 4 through 6). The second trimester is often a time when you feel your best. Your body has adjusted to being pregnant, and you begin to feel better physically. Usually, morning sickness has lessened or quit completely, you may have more energy, and you may have an increase in appetite. The second trimester is also a time when the fetus is growing rapidly. At the end of the sixth month, the fetus is about 9 inches long and weighs about 1½ pounds. You will likely begin to feel the baby move (quickening) between 16 and 20 weeks of pregnancy. °Body changes during your second trimester °Your body continues to go through many changes during your second trimester. The changes vary from woman to woman. °· Your weight will continue to increase. You will notice your lower abdomen bulging out. °· You may begin to get stretch marks on your hips, abdomen, and breasts. °· You may develop headaches that can be relieved by medicines. The medicines should be approved by your health care provider. °· You may urinate more often because the fetus is pressing on your bladder. °· You may develop or continue to have heartburn as a result of your pregnancy. °· You may develop constipation because certain hormones are causing the muscles that push waste through your intestines to slow down. °· You may develop hemorrhoids or swollen, bulging veins (varicose veins). °· You may have back pain. This is caused by: °? Weight gain. °? Pregnancy hormones that are relaxing the joints in your pelvis. °? A shift in weight and the muscles that support your balance. °· Your breasts will continue to grow and they will continue to become tender. °· Your gums may bleed and may be sensitive to brushing and flossing. °· Dark spots or blotches (chloasma, mask of pregnancy) may develop on your face. This will likely fade after the baby is born. °· A dark line from  your belly button to the pubic area (linea nigra) may appear. This will likely fade after the baby is born. °· You may have changes in your hair. These can include thickening of your hair, rapid growth, and changes in texture. Some women also have hair loss during or after pregnancy, or hair that feels dry or thin. Your hair will most likely return to normal after your baby is born. °What to expect at prenatal visits °During a routine prenatal visit: °· You will be weighed to make sure you and the fetus are growing normally. °· Your blood pressure will be taken. °· Your abdomen will be measured to track your baby's growth. °· The fetal heartbeat will be listened to. °· Any test results from the previous visit will be discussed. °Your health care provider may ask you: °· How you are feeling. °· If you are feeling the baby move. °· If you have had any abnormal symptoms, such as leaking fluid, bleeding, severe headaches, or abdominal cramping. °· If you are using any tobacco products, including cigarettes, chewing tobacco, and electronic cigarettes. °· If you have any questions. °Other tests that may be performed during your second trimester include: °· Blood tests that check for: °? Low iron levels (anemia). °? High blood sugar that affects pregnant women (gestational diabetes) between 24 and 28 weeks. °? Rh antibodies. This is to check for a protein on red blood cells (Rh factor). °· Urine tests to check for infections, diabetes, or protein in   the urine. °· An ultrasound to confirm the proper growth and development of the baby. °· An amniocentesis to check for possible genetic problems. °· Fetal screens for spina bifida and Down syndrome. °· HIV (human immunodeficiency virus) testing. Routine prenatal testing includes screening for HIV, unless you choose not to have this test. °Follow these instructions at home: °Medicines °· Follow your health care provider's instructions regarding medicine use. Specific medicines  may be either safe or unsafe to take during pregnancy. °· Take a prenatal vitamin that contains at least 600 micrograms (mcg) of folic acid. °· If you develop constipation, try taking a stool softener if your health care provider approves. °Eating and drinking ° °· Eat a balanced diet that includes fresh fruits and vegetables, whole grains, good sources of protein such as meat, eggs, or tofu, and low-fat dairy. Your health care provider will help you determine the amount of weight gain that is right for you. °· Avoid raw meat and uncooked cheese. These carry germs that can cause birth defects in the baby. °· If you have low calcium intake from food, talk to your health care provider about whether you should take a daily calcium supplement. °· Limit foods that are high in fat and processed sugars, such as fried and sweet foods. °· To prevent constipation: °? Drink enough fluid to keep your urine clear or pale yellow. °? Eat foods that are high in fiber, such as fresh fruits and vegetables, whole grains, and beans. °Activity °· Exercise only as directed by your health care provider. Most women can continue their usual exercise routine during pregnancy. Try to exercise for 30 minutes at least 5 days a week. Stop exercising if you experience uterine contractions. °· Avoid heavy lifting, wear low heel shoes, and practice good posture. °· A sexual relationship may be continued unless your health care provider directs you otherwise. °Relieving pain and discomfort °· Wear a good support bra to prevent discomfort from breast tenderness. °· Take warm sitz baths to soothe any pain or discomfort caused by hemorrhoids. Use hemorrhoid cream if your health care provider approves. °· Rest with your legs elevated if you have leg cramps or low back pain. °· If you develop varicose veins, wear support hose. Elevate your feet for 15 minutes, 3-4 times a day. Limit salt in your diet. °Prenatal Care °· Write down your questions. Take  them to your prenatal visits. °· Keep all your prenatal visits as told by your health care provider. This is important. °Safety °· Wear your seat belt at all times when driving. °· Make a list of emergency phone numbers, including numbers for family, friends, the hospital, and police and fire departments. °General instructions °· Ask your health care provider for a referral to a local prenatal education class. Begin classes no later than the beginning of month 6 of your pregnancy. °· Ask for help if you have counseling or nutritional needs during pregnancy. Your health care provider can offer advice or refer you to specialists for help with various needs. °· Do not use hot tubs, steam rooms, or saunas. °· Do not douche or use tampons or scented sanitary pads. °· Do not cross your legs for long periods of time. °· Avoid cat litter boxes and soil used by cats. These carry germs that can cause birth defects in the baby and possibly loss of the fetus by miscarriage or stillbirth. °· Avoid all smoking, herbs, alcohol, and unprescribed drugs. Chemicals in these products can affect the   formation and growth of the baby. °· Do not use any products that contain nicotine or tobacco, such as cigarettes and e-cigarettes. If you need help quitting, ask your health care provider. °· Visit your dentist if you have not gone yet during your pregnancy. Use a soft toothbrush to brush your teeth and be gentle when you floss. °Contact a health care provider if: °· You have dizziness. °· You have mild pelvic cramps, pelvic pressure, or nagging pain in the abdominal area. °· You have persistent nausea, vomiting, or diarrhea. °· You have a bad smelling vaginal discharge. °· You have pain when you urinate. °Get help right away if: °· You have a fever. °· You are leaking fluid from your vagina. °· You have spotting or bleeding from your vagina. °· You have severe abdominal cramping or pain. °· You have rapid weight gain or weight loss. °· You  have shortness of breath with chest pain. °· You notice sudden or extreme swelling of your face, hands, ankles, feet, or legs. °· You have not felt your baby move in over an hour. °· You have severe headaches that do not go away when you take medicine. °· You have vision changes. °Summary °· The second trimester is from week 14 through week 27 (months 4 through 6). It is also a time when the fetus is growing rapidly. °· Your body goes through many changes during pregnancy. The changes vary from woman to woman. °· Avoid all smoking, herbs, alcohol, and unprescribed drugs. These chemicals affect the formation and growth your baby. °· Do not use any tobacco products, such as cigarettes, chewing tobacco, and e-cigarettes. If you need help quitting, ask your health care provider. °· Contact your health care provider if you have any questions. Keep all prenatal visits as told by your health care provider. This is important. °This information is not intended to replace advice given to you by your health care provider. Make sure you discuss any questions you have with your health care provider. °Document Released: 06/28/2001 Document Revised: 08/09/2016 Document Reviewed: 08/09/2016 °[employer] Interactive Patient Education © 2019 [employer] ° ° °Contraception Choices °Contraception, also called birth control, refers to methods or devices that prevent pregnancy. °Hormonal methods °Contraceptive implant ° °A contraceptive implant is a thin, plastic tube that contains a hormone. It is inserted into the upper part of the arm. It can remain in place for up to 3 years. °Progestin-only injections °Progestin-only injections are injections of progestin, a synthetic form of the hormone progesterone. They are given every 3 months by a health care provider. °Birth control pills ° °Birth control pills are pills that contain hormones that prevent pregnancy. They must be taken once a day, preferably at the same time each day. °Birth  control patch ° °The birth control patch contains hormones that prevent pregnancy. It is placed on the skin and must be changed once a week for three weeks and removed on the fourth week. A prescription is needed to use this method of contraception. °Vaginal ring ° °A vaginal ring contains hormones that prevent pregnancy. It is placed in the vagina for three weeks and removed on the fourth week. After that, the process is repeated with a new ring. A prescription is needed to use this method of contraception. °Emergency contraceptive °Emergency contraceptives prevent pregnancy after unprotected sex. They come in pill form and can be taken up to 5 days after sex. They work best the sooner they are taken after having sex. Most   emergency contraceptives are available without a prescription. This method should not be used as your only form of birth control. °Barrier methods °Female condom ° °A female condom is a thin sheath that is worn over the penis during sex. Condoms keep sperm from going inside a woman's body. They can be used with a spermicide to increase their effectiveness. They should be disposed after a single use. °Female condom ° °A female condom is a soft, loose-fitting sheath that is put into the vagina before sex. The condom keeps sperm from going inside a woman's body. They should be disposed after a single use. °Diaphragm ° °A diaphragm is a soft, dome-shaped barrier. It is inserted into the vagina before sex, along with a spermicide. The diaphragm blocks sperm from entering the uterus, and the spermicide kills sperm. A diaphragm should be left in the vagina for 6-8 hours after sex and removed within 24 hours. °A diaphragm is prescribed and fitted by a health care provider. A diaphragm should be replaced every 1-2 years, after giving birth, after gaining more than 15 lb (6.8 kg), and after pelvic surgery. °Cervical cap ° °A cervical cap is a round, soft latex or plastic cup that fits over the cervix. It is  inserted into the vagina before sex, along with spermicide. It blocks sperm from entering the uterus. The cap should be left in place for 6-8 hours after sex and removed within 48 hours. A cervical cap must be prescribed and fitted by a health care provider. It should be replaced every 2 years. °Sponge ° °A sponge is a soft, circular piece of polyurethane foam with spermicide on it. The sponge helps block sperm from entering the uterus, and the spermicide kills sperm. To use it, you make it wet and then insert it into the vagina. It should be inserted before sex, left in for at least 6 hours after sex, and removed and thrown away within 30 hours. °Spermicides °Spermicides are chemicals that kill or block sperm from entering the cervix and uterus. They can come as a cream, jelly, suppository, foam, or tablet. A spermicide should be inserted into the vagina with an applicator at least 10-15 minutes before sex to allow time for it to work. The process must be repeated every time you have sex. Spermicides do not require a prescription. °Intrauterine contraception °Intrauterine device (IUD) °An IUD is a T-shaped device that is put in a woman's uterus. There are two types: °· Hormone IUD.This type contains progestin, a synthetic form of the hormone progesterone. This type can stay in place for 3-5 years. °· Copper IUD.This type is wrapped in copper wire. It can stay in place for 10 years. ° °Permanent methods of contraception °Female tubal ligation °In this method, a woman's fallopian tubes are sealed, tied, or blocked during surgery to prevent eggs from traveling to the uterus. °Hysteroscopic sterilization °In this method, a small, flexible insert is placed into each fallopian tube. The inserts cause scar tissue to form in the fallopian tubes and block them, so sperm cannot reach an egg. The procedure takes about 3 months to be effective. Another form of birth control must be used during those 3 months. °Female  sterilization °This is a procedure to tie off the tubes that carry sperm (vasectomy). After the procedure, the man can still ejaculate fluid (semen). °Natural planning methods °Natural family planning °In this method, a couple does not have sex on days when the woman could become pregnant. °Calendar method °This means keeping   track of the length of each menstrual cycle, identifying the days when pregnancy can happen, and not having sex on those days. °Ovulation method °In this method, a couple avoids sex during ovulation. °Symptothermal method °This method involves not having sex during ovulation. The woman typically checks for ovulation by watching changes in her temperature and in the consistency of cervical mucus. °Post-ovulation method °In this method, a couple waits to have sex until after ovulation. °Summary °· Contraception, also called birth control, means methods or devices that prevent pregnancy. °· Hormonal methods of contraception include implants, injections, pills, patches, vaginal rings, and emergency contraceptives. °· Barrier methods of contraception can include female condoms, female condoms, diaphragms, cervical caps, sponges, and spermicides. °· There are two types of IUDs (intrauterine devices). An IUD can be put in a woman's uterus to prevent pregnancy for 3-5 years. °· Permanent sterilization can be done through a procedure for males, females, or both. °· Natural family planning methods involve not having sex on days when the woman could become pregnant. °This information is not intended to replace advice given to you by your health care provider. Make sure you discuss any questions you have with your health care provider. °Document Released: 07/04/2005 Document Revised: 07/06/2017 Document Reviewed: 08/06/2016 °[employer] Interactive Patient Education © 2019 [employer] ° ° °Breastfeeding ° °Choosing to breastfeed is one of the best decisions you can make for yourself and your baby. A change in  hormones during pregnancy causes your breasts to make breast milk in your milk-producing glands. Hormones prevent breast milk from being released before your baby is born. They also prompt milk flow after birth. Once breastfeeding has begun, thoughts of your baby, as well as his or her sucking or crying, can stimulate the release of milk from your milk-producing glands. °Benefits of breastfeeding °Research shows that breastfeeding offers many health benefits for infants and mothers. It also offers a cost-free and convenient way to feed your baby. °For your baby °· Your first milk (colostrum) helps your baby's digestive system to function better. °· Special cells in your milk (antibodies) help your baby to fight off infections. °· Breastfed babies are less likely to develop asthma, allergies, obesity, or type 2 diabetes. They are also at lower risk for sudden infant death syndrome (SIDS). °· Nutrients in breast milk are better able to meet your baby’s needs compared to infant formula. °· Breast milk improves your baby's brain development. °For you °· Breastfeeding helps to create a very special bond between you and your baby. °· Breastfeeding is convenient. Breast milk costs nothing and is always available at the correct temperature. °· Breastfeeding helps to burn calories. It helps you to lose the weight that you gained during pregnancy. °· Breastfeeding makes your uterus return faster to its size before pregnancy. It also slows bleeding (lochia) after you give birth. °· Breastfeeding helps to lower your risk of developing type 2 diabetes, osteoporosis, rheumatoid arthritis, cardiovascular disease, and breast, ovarian, uterine, and endometrial cancer later in life. °Breastfeeding basics °Starting breastfeeding °· Find a comfortable place to sit or lie down, with your neck and back well-supported. °· Place a pillow or a rolled-up blanket under your baby to bring him or her to the level of your breast (if you are  seated). Nursing pillows are specially designed to help support your arms and your baby while you breastfeed. °· Make sure that your baby's tummy (abdomen) is facing your abdomen. °· Gently massage your breast. With your fingertips, massage from   the outer edges of your breast inward toward the nipple. This encourages milk flow. If your milk flows slowly, you may need to continue this action during the feeding. °· Support your breast with 4 fingers underneath and your thumb above your nipple (make the letter "C" with your hand). Make sure your fingers are well away from your nipple and your baby’s mouth. °· Stroke your baby's lips gently with your finger or nipple. °· When your baby's mouth is open wide enough, quickly bring your baby to your breast, placing your entire nipple and as much of the areola as possible into your baby's mouth. The areola is the colored area around your nipple. °? More areola should be visible above your baby's upper lip than below the lower lip. °? Your baby's lips should be opened and extended outward (flanged) to ensure an adequate, comfortable latch. °? Your baby's tongue should be between his or her lower gum and your breast. °· Make sure that your baby's mouth is correctly positioned around your nipple (latched). Your baby's lips should create a seal on your breast and be turned out (everted). °· It is common for your baby to suck about 2-3 minutes in order to start the flow of breast milk. °Latching °Teaching your baby how to latch onto your breast properly is very important. An improper latch can cause nipple pain, decreased milk supply, and poor weight gain in your baby. Also, if your baby is not latched onto your nipple properly, he or she may swallow some air during feeding. This can make your baby fussy. Burping your baby when you switch breasts during the feeding can help to get rid of the air. However, teaching your baby to latch on properly is still the best way to prevent  fussiness from swallowing air while breastfeeding. °Signs that your baby has successfully latched onto your nipple °· Silent tugging or silent sucking, without causing you pain. Infant's lips should be extended outward (flanged). °· Swallowing heard between every 3-4 sucks once your milk has started to flow (after your let-down milk reflex occurs). °· Muscle movement above and in front of his or her ears while sucking. °Signs that your baby has not successfully latched onto your nipple °· Sucking sounds or smacking sounds from your baby while breastfeeding. °· Nipple pain. °If you think your baby has not latched on correctly, slip your finger into the corner of your baby’s mouth to break the suction and place it between your baby's gums. Attempt to start breastfeeding again. °Signs of successful breastfeeding °Signs from your baby °· Your baby will gradually decrease the number of sucks or will completely stop sucking. °· Your baby will fall asleep. °· Your baby's body will relax. °· Your baby will retain a small amount of milk in his or her mouth. °· Your baby will let go of your breast by himself or herself. °Signs from you °· Breasts that have increased in firmness, weight, and size 1-3 hours after feeding. °· Breasts that are softer immediately after breastfeeding. °· Increased milk volume, as well as a change in milk consistency and color by the fifth day of breastfeeding. °· Nipples that are not sore, cracked, or bleeding. °Signs that your baby is getting enough milk °· Wetting at least 1-2 diapers during the first 24 hours after birth. °· Wetting at least 5-6 diapers every 24 hours for the first week after birth. The urine should be clear or pale yellow by the age of 5 days. °·   Wetting 6-8 diapers every 24 hours as your baby continues to grow and develop. °· At least 3 stools in a 24-hour period by the age of 5 days. The stool should be soft and yellow. °· At least 3 stools in a 24-hour period by the age of 7  days. The stool should be seedy and yellow. °· No loss of weight greater than 10% of birth weight during the first 3 days of life. °· Average weight gain of 4-7 oz (113-198 g) per week after the age of 4 days. °· Consistent daily weight gain by the age of 5 days, without weight loss after the age of 2 weeks. °After a feeding, your baby may spit up a small amount of milk. This is normal. °Breastfeeding frequency and duration °Frequent feeding will help you make more milk and can prevent sore nipples and extremely full breasts (breast engorgement). Breastfeed when you feel the need to reduce the fullness of your breasts or when your baby shows signs of hunger. This is called "breastfeeding on demand." Signs that your baby is hungry include: °· Increased alertness, activity, or restlessness. °· Movement of the head from side to side. °· Opening of the mouth when the corner of the mouth or cheek is stroked (rooting). °· Increased sucking sounds, smacking lips, cooing, sighing, or squeaking. °· Hand-to-mouth movements and sucking on fingers or hands. °· Fussing or crying. °Avoid introducing a pacifier to your baby in the first 4-6 weeks after your baby is born. After this time, you may choose to use a pacifier. Research has shown that pacifier use during the first year of a baby's life decreases the risk of sudden infant death syndrome (SIDS). °Allow your baby to feed on each breast as long as he or she wants. When your baby unlatches or falls asleep while feeding from the first breast, offer the second breast. Because newborns are often sleepy in the first few weeks of life, you may need to awaken your baby to get him or her to feed. °Breastfeeding times will vary from baby to baby. However, the following rules can serve as a guide to help you make sure that your baby is properly fed: °· Newborns (babies 4 weeks of age or younger) may breastfeed every 1-3 hours. °· Newborns should not go without breastfeeding for longer  than 3 hours during the day or 5 hours during the night. °· You should breastfeed your baby a minimum of 8 times in a 24-hour period. °Breast milk pumping ° °  ° °Pumping and storing breast milk allows you to make sure that your baby is exclusively fed your breast milk, even at times when you are unable to breastfeed. This is especially important if you go back to work while you are still breastfeeding, or if you are not able to be present during feedings. Your lactation consultant can help you find a method of pumping that works best for you and give you guidelines about how long it is safe to store breast milk. °Caring for your breasts while you breastfeed °Nipples can become dry, cracked, and sore while breastfeeding. The following recommendations can help keep your breasts moisturized and healthy: °· Avoid using soap on your nipples. °· Wear a supportive bra designed especially for nursing. Avoid wearing underwire-style bras or extremely tight bras (sports bras). °· Air-dry your nipples for 3-4 minutes after each feeding. °· Use only cotton bra pads to absorb leaked breast milk. Leaking of breast milk between feedings is   normal. °· Use lanolin on your nipples after breastfeeding. Lanolin helps to maintain your skin's normal moisture barrier. Pure lanolin is not harmful (not toxic) to your baby. You may also hand express a few drops of breast milk and gently massage that milk into your nipples and allow the milk to air-dry. °In the first few weeks after giving birth, some women experience breast engorgement. Engorgement can make your breasts feel heavy, warm, and tender to the touch. Engorgement peaks within 3-5 days after you give birth. The following recommendations can help to ease engorgement: °· Completely empty your breasts while breastfeeding or pumping. You may want to start by applying warm, moist heat (in the shower or with warm, water-soaked hand towels) just before feeding or pumping. This increases  circulation and helps the milk flow. If your baby does not completely empty your breasts while breastfeeding, pump any extra milk after he or she is finished. °· Apply ice packs to your breasts immediately after breastfeeding or pumping, unless this is too uncomfortable for you. To do this: °? Put ice in a plastic bag. °? Place a towel between your skin and the bag. °? Leave the ice on for 20 minutes, 2-3 times a day. °· Make sure that your baby is latched on and positioned properly while breastfeeding. °If engorgement persists after 48 hours of following these recommendations, contact your health care provider or a lactation consultant. °Overall health care recommendations while breastfeeding °· Eat 3 healthy meals and 3 snacks every day. Well-nourished mothers who are breastfeeding need an additional 450-500 calories a day. You can meet this requirement by increasing the amount of a balanced diet that you eat. °· Drink enough water to keep your urine pale yellow or clear. °· Rest often, relax, and continue to take your prenatal vitamins to prevent fatigue, stress, and low vitamin and mineral levels in your body (nutrient deficiencies). °· Do not use any products that contain nicotine or tobacco, such as cigarettes and e-cigarettes. Your baby may be harmed by chemicals from cigarettes that pass into breast milk and exposure to secondhand smoke. If you need help quitting, ask your health care provider. °· Avoid alcohol. °· Do not use illegal drugs or marijuana. °· Talk with your health care provider before taking any medicines. These include over-the-counter and prescription medicines as well as vitamins and herbal supplements. Some medicines that may be harmful to your baby can pass through breast milk. °· It is possible to become pregnant while breastfeeding. If birth control is desired, ask your health care provider about options that will be safe while breastfeeding your baby. °Where to find more  information: °[employer]: www.llli.org °Contact a health care provider if: °· You feel like you want to stop breastfeeding or have become frustrated with breastfeeding. °· Your nipples are cracked or bleeding. °· Your breasts are red, tender, or warm. °· You have: °? Painful breasts or nipples. °? A swollen area on either breast. °? A fever or chills. °? Nausea or vomiting. °? Drainage other than breast milk from your nipples. °· Your breasts do not become full before feedings by the fifth day after you give birth. °· You feel sad and depressed. °· Your baby is: °? Too sleepy to eat well. °? Having trouble sleeping. °? More than 1 week old and wetting fewer than 6 diapers in a 24-hour period. °? Not gaining weight by 5 days of age. °· Your baby has fewer than 3 stools in a   24-hour period. °· Your baby's skin or the white parts of his or her eyes become yellow. °Get help right away if: °· Your baby is overly tired (lethargic) and does not want to wake up and feed. °· Your baby develops an unexplained fever. °Summary °· Breastfeeding offers many health benefits for infant and mothers. °· Try to breastfeed your infant when he or she shows early signs of hunger. °· Gently tickle or stroke your baby's lips with your finger or nipple to allow the baby to open his or her mouth. Bring the baby to your breast. Make sure that much of the areola is in your baby's mouth. Offer one side and burp the baby before you offer the other side. °· Talk with your health care provider or lactation consultant if you have questions or you face problems as you breastfeed. °This information is not intended to replace advice given to you by your health care provider. Make sure you discuss any questions you have with your health care provider. °Document Released: 07/04/2005 Document Revised: 08/05/2016 Document Reviewed: 08/05/2016 °Elsevier Interactive Patient Education © 2019 Elsevier Inc. ° °

## 2018-11-30 LAB — OBSTETRIC PANEL, INCLUDING HIV
Antibody Screen: NEGATIVE
Basophils Absolute: 0 10*3/uL (ref 0.0–0.2)
Basos: 0 %
EOS (ABSOLUTE): 0.4 10*3/uL (ref 0.0–0.4)
Eos: 3 %
HIV Screen 4th Generation wRfx: NONREACTIVE
Hematocrit: 37.5 % (ref 34.0–46.6)
Hemoglobin: 12.9 g/dL (ref 11.1–15.9)
Hepatitis B Surface Ag: NEGATIVE
Immature Grans (Abs): 0 10*3/uL (ref 0.0–0.1)
Immature Granulocytes: 0 %
Lymphocytes Absolute: 2.7 10*3/uL (ref 0.7–3.1)
Lymphs: 23 %
MCH: 30.8 pg (ref 26.6–33.0)
MCHC: 34.4 g/dL (ref 31.5–35.7)
MCV: 90 fL (ref 79–97)
Monocytes Absolute: 0.6 10*3/uL (ref 0.1–0.9)
Monocytes: 5 %
Neutrophils Absolute: 7.8 10*3/uL — ABNORMAL HIGH (ref 1.4–7.0)
Neutrophils: 69 %
Platelets: 286 10*3/uL (ref 150–450)
RBC: 4.19 x10E6/uL (ref 3.77–5.28)
RDW: 13.5 % (ref 11.7–15.4)
RPR Ser Ql: NONREACTIVE
Rh Factor: POSITIVE
Rubella Antibodies, IGG: 0.93 index — ABNORMAL LOW (ref >0.99)
WBC: 11.5 10*3/uL — ABNORMAL HIGH (ref 3.4–10.8)

## 2018-12-03 DIAGNOSIS — O09899 Supervision of other high risk pregnancies, unspecified trimester: Secondary | ICD-10-CM | POA: Insufficient documentation

## 2018-12-03 DIAGNOSIS — Z283 Underimmunization status: Secondary | ICD-10-CM | POA: Insufficient documentation

## 2018-12-05 LAB — URINE CULTURE, OB REFLEX

## 2018-12-05 LAB — CULTURE, OB URINE

## 2018-12-06 DIAGNOSIS — O0990 Supervision of high risk pregnancy, unspecified, unspecified trimester: Secondary | ICD-10-CM | POA: Diagnosis not present

## 2018-12-08 DIAGNOSIS — R8271 Bacteriuria: Secondary | ICD-10-CM | POA: Insufficient documentation

## 2018-12-08 MED ORDER — PENICILLIN V POTASSIUM 500 MG PO TABS: 500.0000 mg | ORAL_TABLET | Freq: Four times a day (QID) | ORAL | 0 refills | Status: DC

## 2018-12-08 NOTE — Addendum Note (Signed)
Addended by: Catalina Antigua on: 12/08/2018 07:07 PM   Modules accepted: Orders

## 2018-12-25 ENCOUNTER — Telehealth: Payer: Self-pay | Admitting: Family Medicine

## 2018-12-25 NOTE — Telephone Encounter (Signed)
Patient aware of Virtual mychart visit.  °

## 2018-12-26 ENCOUNTER — Telehealth: Payer: Self-pay | Admitting: Obstetrics & Gynecology

## 2018-12-26 NOTE — Telephone Encounter (Signed)
Patient was called and given the information for her visit. She has MyChart app.

## 2018-12-27 ENCOUNTER — Other Ambulatory Visit: Payer: Self-pay

## 2018-12-27 ENCOUNTER — Telehealth (INDEPENDENT_AMBULATORY_CARE_PROVIDER_SITE_OTHER): Payer: Medicaid Other | Admitting: Obstetrics & Gynecology

## 2018-12-27 VITALS — BP 126/74 | HR 100

## 2018-12-27 DIAGNOSIS — F17200 Nicotine dependence, unspecified, uncomplicated: Secondary | ICD-10-CM

## 2018-12-27 DIAGNOSIS — J4520 Mild intermittent asthma, uncomplicated: Secondary | ICD-10-CM

## 2018-12-27 DIAGNOSIS — O0990 Supervision of high risk pregnancy, unspecified, unspecified trimester: Secondary | ICD-10-CM

## 2018-12-27 DIAGNOSIS — Z8759 Personal history of other complications of pregnancy, childbirth and the puerperium: Secondary | ICD-10-CM

## 2018-12-27 DIAGNOSIS — R8271 Bacteriuria: Secondary | ICD-10-CM

## 2018-12-27 DIAGNOSIS — O99210 Obesity complicating pregnancy, unspecified trimester: Secondary | ICD-10-CM

## 2018-12-27 DIAGNOSIS — O99212 Obesity complicating pregnancy, second trimester: Secondary | ICD-10-CM

## 2018-12-27 DIAGNOSIS — O0992 Supervision of high risk pregnancy, unspecified, second trimester: Secondary | ICD-10-CM

## 2018-12-27 MED ORDER — ALBUTEROL SULFATE HFA 108 (90 BASE) MCG/ACT IN AERS: 2.0000 | INHALATION_SPRAY | RESPIRATORY_TRACT | 0 refills | Status: DC | PRN

## 2018-12-27 NOTE — Progress Notes (Signed)
   [place] VIRTUAL VIDEO VISIT ENCOUNTER NOTE  Provider location: [facility] at [facility]   I connected with Joanna [name] on 12/27/18 at  8:15 AM EDT by MyChart Video Encounter at home and verified that I am speaking with the correct person using two identifiers.   I discussed the limitations, risks, security and privacy concerns of performing an evaluation and management service by telephone and the availability of in person appointments. I also discussed with the patient that there may be a patient responsible charge related to this service. The patient expressed understanding and agreed to proceed. Subjective:  Joanna [name] is a 28 y.o. single [id number] (47, 24, 64 and 13 yo kids) at [redacted]w[redacted]d being seen today for ongoing prenatal care.  She is currently monitored for the following issues for this high-risk pregnancy and has TOBACCO ABUSE; Mild intermittent asthma; Eczema; Maternal obesity affecting pregnancy, antepartum; Supervision of high risk pregnancy, antepartum; History of IUFD; Rubella non-immune status, antepartum; and GBS bacteriuria on their problem list.  Patient reports no complaints.  Contractions: Not present. Vag. Bleeding: None.  Movement: Present. Denies any leaking of fluid.   The following portions of the patient's history were reviewed and updated as appropriate: allergies, current medications, past family history, past medical history, past social history, past surgical history and problem list.   Objective:   Vitals:   12/27/18 0823  BP: 126/74  Pulse: 100    Fetal Status:     Movement: Present     General:  Alert, oriented and cooperative. Patient is in no acute distress.  Respiratory: Normal respiratory effort, no problems with respiration noted  Mental Status: Normal mood and affect. Normal behavior. Normal judgment and thought content.  Rest of physical exam deferred due to type of encounter  Imaging: No results  found.  Assessment and Plan:  Pregnancy: [id number] at [redacted]w[redacted]d 1. Mild intermittent asthma without complication - albuterol (VENTOLIN HFA) 108 (90 Base) MCG/ACT inhaler; Inhale 2 puffs into the lungs every 4 (four) hours as needed for wheezing or shortness of breath.  Dispense: 2 Inhaler; Refill: 0  2. Supervision of high risk pregnancy, antepartum - MFM u/s next week - will need NIPS drawn at that visit  3. GBS bacteriuria - treat in labor  4. History of IUFD  5. TOBACCO ABUSE - rec decrease/stop, currently at 1/2 ppd  6. Maternal obesity affecting pregnancy, antepartum - rec 12-20 pounds - Hemoglobin A1c; Future  Preterm labor symptoms and general obstetric precautions including but not limited to vaginal bleeding, contractions, leaking of fluid and fetal movement were reviewed in detail with the patient. I discussed the assessment and treatment plan with the patient. The patient was provided an opportunity to ask questions and all were answered. The patient agreed with the plan and demonstrated an understanding of the instructions. The patient was advised to call back or seek an in-person office evaluation/go to MAU at [facility] for any urgent or concerning symptoms. Please refer to After Visit Summary for other counseling recommendations.   I provided 10 minutes of face-to-face time during this encounter.  No follow-ups on file.  Future Appointments  Date Time Provider [place]  01/03/2019 10:00 AM [place] [facility]-US  01/03/2019 10:00 AM [facility]-M[facility] [place] 3 [facility]-M[facility] MFC-US    Teriyah Purington C Davyn Morandi, [place] for [facility], [place]

## 2018-12-27 NOTE — Progress Notes (Signed)
I connected with  Joanna [name] on 12/27/18 at  8:15 AM EDT by telephone and verified that I am speaking with the correct person using two identifiers.   I discussed the limitations, risks, security and privacy concerns of performing an evaluation and management service by telephone and the availability of in person appointments. I also discussed with the patient that there may be a patient responsible charge related to this service. The patient expressed understanding and agreed to proceed.  Dolores Hoose, RN 12/27/2018  8:23 AM   Pt requesting refill on albuterol inhaler.  Pt has BP cuff and is active in babyscripts.

## 2019-01-03 ENCOUNTER — Other Ambulatory Visit (HOSPITAL_COMMUNITY): Payer: Self-pay | Admitting: *Deleted

## 2019-01-03 ENCOUNTER — Ambulatory Visit (HOSPITAL_COMMUNITY)
Admission: RE | Admit: 2019-01-03 | Discharge: 2019-01-03 | Disposition: A | Discharge status: Home / Self Care | Payer: Medicaid Other | Source: Ambulatory Visit | Attending: Obstetrics and Gynecology | Admitting: Obstetrics and Gynecology

## 2019-01-03 ENCOUNTER — Other Ambulatory Visit: Payer: Self-pay

## 2019-01-03 ENCOUNTER — Encounter (HOSPITAL_COMMUNITY): Payer: Self-pay

## 2019-01-03 ENCOUNTER — Ambulatory Visit (HOSPITAL_COMMUNITY): Payer: Medicaid Other | Admitting: *Deleted

## 2019-01-03 VITALS — BP 106/63 | HR 86 | Temp 98.9°F

## 2019-01-03 DIAGNOSIS — O09292 Supervision of pregnancy with other poor reproductive or obstetric history, second trimester: Secondary | ICD-10-CM | POA: Diagnosis not present

## 2019-01-03 DIAGNOSIS — Z3A19 19 weeks gestation of pregnancy: Secondary | ICD-10-CM | POA: Diagnosis not present

## 2019-01-03 DIAGNOSIS — Z8759 Personal history of other complications of pregnancy, childbirth and the puerperium: Secondary | ICD-10-CM

## 2019-01-03 DIAGNOSIS — O0990 Supervision of high risk pregnancy, unspecified, unspecified trimester: Secondary | ICD-10-CM | POA: Diagnosis not present

## 2019-01-03 DIAGNOSIS — Z363 Encounter for antenatal screening for malformations: Secondary | ICD-10-CM

## 2019-01-03 DIAGNOSIS — O99212 Obesity complicating pregnancy, second trimester: Secondary | ICD-10-CM

## 2019-01-10 ENCOUNTER — Other Ambulatory Visit: Payer: Medicaid Other

## 2019-01-23 ENCOUNTER — Telehealth: Payer: Self-pay | Admitting: Obstetrics & Gynecology

## 2019-01-23 NOTE — Telephone Encounter (Signed)
Attempted to call patient about her appointment on 7/9 @ 10:15. No answer, left voicemail instructing patient to wear a face mask for the entire appointment and no visitors are allowed during the visit. Patient instructed not to attend the appointment if she is experiencing any symptoms. Symptom list and office number left.

## 2019-01-24 ENCOUNTER — Encounter: Payer: Medicaid Other | Admitting: Obstetrics & Gynecology

## 2019-01-29 ENCOUNTER — Encounter: Payer: Self-pay | Admitting: *Deleted

## 2019-01-31 ENCOUNTER — Ambulatory Visit (HOSPITAL_COMMUNITY): Payer: Medicaid Other

## 2019-02-06 ENCOUNTER — Other Ambulatory Visit: Payer: Self-pay

## 2019-02-07 ENCOUNTER — Other Ambulatory Visit: Payer: Self-pay

## 2019-02-07 ENCOUNTER — Telehealth (INDEPENDENT_AMBULATORY_CARE_PROVIDER_SITE_OTHER): Payer: Medicaid Other | Admitting: Obstetrics & Gynecology

## 2019-02-07 VITALS — BP 109/72 | HR 99

## 2019-02-07 DIAGNOSIS — O09899 Supervision of other high risk pregnancies, unspecified trimester: Secondary | ICD-10-CM

## 2019-02-07 DIAGNOSIS — J4520 Mild intermittent asthma, uncomplicated: Secondary | ICD-10-CM

## 2019-02-07 DIAGNOSIS — Z3A24 24 weeks gestation of pregnancy: Secondary | ICD-10-CM

## 2019-02-07 DIAGNOSIS — O9989 Other specified diseases and conditions complicating pregnancy, childbirth and the puerperium: Secondary | ICD-10-CM

## 2019-02-07 DIAGNOSIS — O09292 Supervision of pregnancy with other poor reproductive or obstetric history, second trimester: Secondary | ICD-10-CM

## 2019-02-07 DIAGNOSIS — O0990 Supervision of high risk pregnancy, unspecified, unspecified trimester: Secondary | ICD-10-CM

## 2019-02-07 DIAGNOSIS — R8271 Bacteriuria: Secondary | ICD-10-CM

## 2019-02-07 DIAGNOSIS — F17200 Nicotine dependence, unspecified, uncomplicated: Secondary | ICD-10-CM

## 2019-02-07 DIAGNOSIS — O99212 Obesity complicating pregnancy, second trimester: Secondary | ICD-10-CM

## 2019-02-07 DIAGNOSIS — O99210 Obesity complicating pregnancy, unspecified trimester: Secondary | ICD-10-CM

## 2019-02-07 DIAGNOSIS — Z283 Underimmunization status: Secondary | ICD-10-CM

## 2019-02-07 DIAGNOSIS — Z8759 Personal history of other complications of pregnancy, childbirth and the puerperium: Secondary | ICD-10-CM

## 2019-02-07 DIAGNOSIS — O99820 Streptococcus B carrier state complicating pregnancy: Secondary | ICD-10-CM

## 2019-02-07 DIAGNOSIS — O0992 Supervision of high risk pregnancy, unspecified, second trimester: Secondary | ICD-10-CM

## 2019-02-07 MED ORDER — PENICILLIN V POTASSIUM 500 MG PO TABS: 500.0000 mg | ORAL_TABLET | Freq: Four times a day (QID) | ORAL | 0 refills | Status: DC

## 2019-02-07 NOTE — Progress Notes (Signed)
TELEHEALTH OBSTETRICS PRENATAL VIRTUAL VIDEO VISIT ENCOUNTER NOTE  Provider location: [facility] [place]   I connected with Joanna [name] on 02/07/19 at  9:15 AM EDT by MyChart Video Encounter at home and verified that I am speaking with the correct person using two identifiers.   I discussed the limitations, risks, security and privacy concerns of performing an evaluation and management service virtually and the availability of in person appointments. I also discussed with the patient that there may be a patient responsible charge related to this service. The patient expressed understanding and agreed to proceed. Subjective:  Joanna [name] is a 28 y.o. G6P4104 at [redacted]w[redacted]d being seen today for ongoing prenatal care.  She is currently monitored for the following issues for this high-risk pregnancy and has TOBACCO ABUSE; Mild intermittent asthma; Eczema; Maternal obesity affecting pregnancy, antepartum; Supervision of high risk pregnancy, antepartum; History of IUFD; Rubella non-immune status, antepartum; and GBS bacteriuria on their problem list.  Patient reports smelly urine with occ leaking of urine.   .   . Vag. Bleeding: None.  Movement: Absent. Denies any leaking of fluid.   The following portions of the patient's history were reviewed and updated as appropriate: allergies, current medications, past family history, past medical history, past social history, past surgical history and problem list.   Objective:   Vitals:   02/07/19 0924  BP: 109/72  Pulse: 99    Fetal Status:     Movement: Absent     General:  Alert, oriented and cooperative. Patient is in no acute distress.  Respiratory: Normal respiratory effort, no problems with respiration noted  Mental Status: Normal mood and affect. Normal behavior. Normal judgment and thought content.  Rest of physical exam deferred due to type of encounter  Imaging: No results found.  Assessment and Plan:   Pregnancy: G6P4104 at [redacted]w[redacted]d 1. Supervision of high risk pregnancy, antepartum F/u scheduled.   2. Rubella non-immune status, antepartum Needs MMR PP  3. TOBACCO ABUSE 8-10 cigs per day Pt considering using the nicotine patch  rec decreasing number of cigs per day  4. Mild intermittent asthma without complication No needed recently.  Last used MDI 2 weeks  5. Maternal obesity affecting pregnancy, antepartum  6. History of IUFD Taking baby ASA  7. GBS bacteriuria  Preterm labor symptoms and general obstetric precautions including but not limited to vaginal bleeding, contractions, leaking of fluid and fetal movement were reviewed in detail with the patient. I discussed the assessment and treatment plan with the patient. The patient was provided an opportunity to ask questions and all were answered. The patient agreed with the plan and demonstrated an understanding of the instructions. The patient was advised to call back or seek an in-person office evaluation/go to [facility] for any urgent or concerning symptoms. Please refer to After Visit Summary for other counseling recommendations.   I provided 12 minutes of face-to-face time during this encounter.  Return in about 4 weeks (around 03/07/2019) for in person.  Future Appointments  Date Time Provider Department Center  02/18/2019 10:00 AM [facility]-M[facility] NURSE [facility] MFC-US  02/18/2019 10:00 AM [facility]-M[facility] US 3 [facility]-MFCUS MFC-US    Carolyn Harraway-Smith, MD [facility], [facility] 

## 2019-02-14 ENCOUNTER — Telehealth: Payer: Self-pay

## 2019-02-14 NOTE — Telephone Encounter (Signed)
[name] from [facility] called and stated that she is missing the first page of medical form can someone please refax.  Notified [name] to contact [name].

## 2019-02-18 ENCOUNTER — Other Ambulatory Visit: Payer: Self-pay

## 2019-02-18 ENCOUNTER — Other Ambulatory Visit (HOSPITAL_COMMUNITY): Payer: Self-pay | Admitting: *Deleted

## 2019-02-18 ENCOUNTER — Ambulatory Visit (HOSPITAL_COMMUNITY)
Admission: RE | Admit: 2019-02-18 | Discharge: 2019-02-18 | Disposition: A | Discharge status: Home / Self Care | Payer: Medicaid Other | Source: Ambulatory Visit | Attending: Obstetrics and Gynecology | Admitting: Obstetrics and Gynecology

## 2019-02-18 ENCOUNTER — Ambulatory Visit (HOSPITAL_COMMUNITY): Payer: Medicaid Other | Admitting: *Deleted

## 2019-02-18 ENCOUNTER — Encounter (HOSPITAL_COMMUNITY): Payer: Self-pay | Admitting: *Deleted

## 2019-02-18 DIAGNOSIS — R8271 Bacteriuria: Secondary | ICD-10-CM | POA: Insufficient documentation

## 2019-02-18 DIAGNOSIS — O09292 Supervision of pregnancy with other poor reproductive or obstetric history, second trimester: Secondary | ICD-10-CM

## 2019-02-18 DIAGNOSIS — Z3A25 25 weeks gestation of pregnancy: Secondary | ICD-10-CM

## 2019-02-18 DIAGNOSIS — O99212 Obesity complicating pregnancy, second trimester: Secondary | ICD-10-CM

## 2019-02-18 DIAGNOSIS — Z2839 Other underimmunization status: Secondary | ICD-10-CM

## 2019-02-18 DIAGNOSIS — Z362 Encounter for other antenatal screening follow-up: Secondary | ICD-10-CM | POA: Diagnosis not present

## 2019-02-18 DIAGNOSIS — Z8759 Personal history of other complications of pregnancy, childbirth and the puerperium: Secondary | ICD-10-CM | POA: Diagnosis not present

## 2019-02-18 DIAGNOSIS — O9989 Other specified diseases and conditions complicating pregnancy, childbirth and the puerperium: Secondary | ICD-10-CM | POA: Diagnosis not present

## 2019-02-18 DIAGNOSIS — Z283 Underimmunization status: Secondary | ICD-10-CM | POA: Diagnosis not present

## 2019-02-25 DIAGNOSIS — Z029 Encounter for administrative examinations, unspecified: Secondary | ICD-10-CM

## 2019-02-26 NOTE — Telephone Encounter (Signed)
Done called on 7/30

## 2019-03-07 ENCOUNTER — Telehealth: Payer: Self-pay | Admitting: Obstetrics and Gynecology

## 2019-03-07 NOTE — Telephone Encounter (Signed)
Called patient, she is aware of her appointment and was instructed to wear a face mask and also due to COVID no visitors are allowed.

## 2019-03-08 ENCOUNTER — Encounter: Payer: Medicaid Other | Admitting: Obstetrics and Gynecology

## 2019-03-18 ENCOUNTER — Telehealth: Payer: Self-pay | Admitting: Family Medicine

## 2019-03-18 NOTE — Telephone Encounter (Signed)
Called the patient to confirm the upcoming appointment. Informed the patient of no visitors or children due to covid restrictions. The patient answered no to all of the screening questions. Informed the patient of the new location. The patient verbalized understanding. °

## 2019-03-19 ENCOUNTER — Encounter (HOSPITAL_COMMUNITY): Payer: Self-pay | Admitting: *Deleted

## 2019-03-19 ENCOUNTER — Ambulatory Visit (HOSPITAL_COMMUNITY): Payer: Medicaid Other | Admitting: *Deleted

## 2019-03-19 ENCOUNTER — Ambulatory Visit (HOSPITAL_COMMUNITY)
Admission: RE | Admit: 2019-03-19 | Discharge: 2019-03-19 | Disposition: A | Discharge status: Home / Self Care | Payer: Medicaid Other | Source: Ambulatory Visit | Attending: Obstetrics and Gynecology | Admitting: Obstetrics and Gynecology

## 2019-03-19 ENCOUNTER — Other Ambulatory Visit: Payer: Self-pay

## 2019-03-19 ENCOUNTER — Ambulatory Visit (INDEPENDENT_AMBULATORY_CARE_PROVIDER_SITE_OTHER): Payer: Medicaid Other | Admitting: Obstetrics and Gynecology

## 2019-03-19 ENCOUNTER — Other Ambulatory Visit (HOSPITAL_COMMUNITY): Payer: Self-pay | Admitting: *Deleted

## 2019-03-19 VITALS — BP 117/66 | HR 98 | Wt 239.0 lb

## 2019-03-19 DIAGNOSIS — Z3A29 29 weeks gestation of pregnancy: Secondary | ICD-10-CM | POA: Diagnosis not present

## 2019-03-19 DIAGNOSIS — R8271 Bacteriuria: Secondary | ICD-10-CM | POA: Insufficient documentation

## 2019-03-19 DIAGNOSIS — O365930 Maternal care for other known or suspected poor fetal growth, third trimester, not applicable or unspecified: Secondary | ICD-10-CM

## 2019-03-19 DIAGNOSIS — O09293 Supervision of pregnancy with other poor reproductive or obstetric history, third trimester: Secondary | ICD-10-CM

## 2019-03-19 DIAGNOSIS — O09899 Supervision of other high risk pregnancies, unspecified trimester: Secondary | ICD-10-CM

## 2019-03-19 DIAGNOSIS — O9989 Other specified diseases and conditions complicating pregnancy, childbirth and the puerperium: Secondary | ICD-10-CM | POA: Diagnosis not present

## 2019-03-19 DIAGNOSIS — J4520 Mild intermittent asthma, uncomplicated: Secondary | ICD-10-CM

## 2019-03-19 DIAGNOSIS — Z8759 Personal history of other complications of pregnancy, childbirth and the puerperium: Secondary | ICD-10-CM

## 2019-03-19 DIAGNOSIS — O99333 Smoking (tobacco) complicating pregnancy, third trimester: Secondary | ICD-10-CM | POA: Diagnosis not present

## 2019-03-19 DIAGNOSIS — O0993 Supervision of high risk pregnancy, unspecified, third trimester: Secondary | ICD-10-CM

## 2019-03-19 DIAGNOSIS — Z362 Encounter for other antenatal screening follow-up: Secondary | ICD-10-CM

## 2019-03-19 DIAGNOSIS — O99213 Obesity complicating pregnancy, third trimester: Secondary | ICD-10-CM | POA: Diagnosis not present

## 2019-03-19 DIAGNOSIS — O0990 Supervision of high risk pregnancy, unspecified, unspecified trimester: Secondary | ICD-10-CM

## 2019-03-19 DIAGNOSIS — Z283 Underimmunization status: Secondary | ICD-10-CM | POA: Diagnosis not present

## 2019-03-19 DIAGNOSIS — O365990 Maternal care for other known or suspected poor fetal growth, unspecified trimester, not applicable or unspecified: Secondary | ICD-10-CM | POA: Insufficient documentation

## 2019-03-19 NOTE — Progress Notes (Signed)
Prenatal Visit Note Date: 03/19/2019 Clinic: [facility]  Subjective:  Joanna [name] is a 28 y.o. [phone] at [redacted]w[redacted]d being seen today for ongoing prenatal care.  She is currently monitored for the following issues for this high-risk pregnancy and has TOBACCO ABUSE; Mild intermittent asthma; Eczema; Maternal obesity affecting pregnancy, antepartum; Supervision of high risk pregnancy, antepartum; History of IUFD; Rubella non-immune status, antepartum; GBS bacteriuria; and Fetal growth retardation, antenatal on their problem list.  Patient reports no complaints.   Contractions: Not present. Vag. Bleeding: None.  Movement: Present. Denies leaking of fluid.   The following portions of the patient's history were reviewed and updated as appropriate: allergies, current medications, past family history, past medical history, past social history, past surgical history and problem list. Problem list updated.  Objective:   Vitals:   03/19/19 1037  BP: 117/66  Pulse: 98  Weight: 239 lb (108.4 kg)    Fetal Status: Fetal Heart Rate (bpm): 145   Movement: Present     General:  Alert, oriented and cooperative. Patient is in no acute distress.  Skin: Skin is warm and dry. No rash noted.   Cardiovascular: Normal heart rate noted  Respiratory: Normal respiratory effort, no problems with respiration noted  Abdomen: Soft, gravid, appropriate for gestational age. Pain/Pressure: Present     Pelvic:  Cervical exam deferred        Extremities: Normal range of motion.  Edema: None  Mental Status: Normal mood and affect. Normal behavior. Normal judgment and thought content.   Urinalysis:      Assessment and Plan:  Pregnancy: [id number] at [redacted]w[redacted]d  1. Supervision of high risk pregnancy, antepartum Routine care. Not set up for GTT today. Future orders put in. Pt told can do lab only visit or next week with isit - Glucose Tolerance, 2 Hours w/1 Hour - CBC - RPR - HIV antibody (with  reflex) - [place] MFM UA CORD DOPPLER; Future - [place] MFM FETAL BPP WO NON STRESS; Future  2. Fetal growth retardation, antenatal New fgr today with rNST today. Rpt ua dopplers and bpp 1wk - [place] MFM UA CORD DOPPLER; Future - [place] MFM FETAL BPP WO NON STRESS; Future  3. Mild intermittent asthma without complication Doing well on no meds  4. IUFD At 35-36wks. See above. Recommend delivery at 39wks at the latest.   Preterm labor symptoms and general obstetric precautions including but not limited to vaginal bleeding, contractions, leaking of fluid and fetal movement were reviewed in detail with the patient. Please refer to After Visit Summary for other counseling recommendations.  Return in about 1 week (around 03/26/2019).   Aletha Halim, MD

## 2019-03-19 NOTE — Procedures (Signed)
Joanna [name] 1990/09/10 [redacted]w[redacted]d  Fetus A Non-Stress Test Interpretation for 03/19/19  Indication: IUGR  Fetal Heart Rate A Mode: External Baseline Rate (A): 140 bpm Variability: Moderate Accelerations: 10 x 10 Decelerations: None Multiple birth?: No  Uterine Activity Mode: Toco Contraction Frequency (min): none noted  Interpretation (Fetal Testing) Nonstress Test Interpretation: Reactive Comments: FHR tracing rev'd by Dr. Gertie Exon

## 2019-04-01 ENCOUNTER — Other Ambulatory Visit: Payer: Self-pay | Admitting: *Deleted

## 2019-04-01 DIAGNOSIS — O99210 Obesity complicating pregnancy, unspecified trimester: Secondary | ICD-10-CM

## 2019-04-01 DIAGNOSIS — Z8759 Personal history of other complications of pregnancy, childbirth and the puerperium: Secondary | ICD-10-CM

## 2019-04-01 DIAGNOSIS — J4520 Mild intermittent asthma, uncomplicated: Secondary | ICD-10-CM

## 2019-04-01 DIAGNOSIS — O0990 Supervision of high risk pregnancy, unspecified, unspecified trimester: Secondary | ICD-10-CM

## 2019-04-02 ENCOUNTER — Other Ambulatory Visit: Payer: Self-pay

## 2019-04-02 ENCOUNTER — Other Ambulatory Visit: Payer: Medicaid Other

## 2019-04-02 ENCOUNTER — Encounter (HOSPITAL_COMMUNITY): Payer: Self-pay

## 2019-04-02 ENCOUNTER — Ambulatory Visit (HOSPITAL_COMMUNITY): Payer: Medicaid Other | Admitting: *Deleted

## 2019-04-02 ENCOUNTER — Ambulatory Visit (HOSPITAL_COMMUNITY)
Admission: RE | Admit: 2019-04-02 | Discharge: 2019-04-02 | Disposition: A | Discharge status: Home / Self Care | Payer: Medicaid Other | Source: Ambulatory Visit | Attending: Maternal & Fetal Medicine | Admitting: Maternal & Fetal Medicine

## 2019-04-02 DIAGNOSIS — O9989 Other specified diseases and conditions complicating pregnancy, childbirth and the puerperium: Secondary | ICD-10-CM | POA: Insufficient documentation

## 2019-04-02 DIAGNOSIS — O0990 Supervision of high risk pregnancy, unspecified, unspecified trimester: Secondary | ICD-10-CM

## 2019-04-02 DIAGNOSIS — R8271 Bacteriuria: Secondary | ICD-10-CM

## 2019-04-02 DIAGNOSIS — O09293 Supervision of pregnancy with other poor reproductive or obstetric history, third trimester: Secondary | ICD-10-CM

## 2019-04-02 DIAGNOSIS — O99210 Obesity complicating pregnancy, unspecified trimester: Secondary | ICD-10-CM | POA: Diagnosis not present

## 2019-04-02 DIAGNOSIS — Z8759 Personal history of other complications of pregnancy, childbirth and the puerperium: Secondary | ICD-10-CM | POA: Diagnosis not present

## 2019-04-02 DIAGNOSIS — O365990 Maternal care for other known or suspected poor fetal growth, unspecified trimester, not applicable or unspecified: Secondary | ICD-10-CM | POA: Diagnosis not present

## 2019-04-02 DIAGNOSIS — J4520 Mild intermittent asthma, uncomplicated: Secondary | ICD-10-CM

## 2019-04-02 DIAGNOSIS — Z3A31 31 weeks gestation of pregnancy: Secondary | ICD-10-CM

## 2019-04-02 DIAGNOSIS — O09899 Supervision of other high risk pregnancies, unspecified trimester: Secondary | ICD-10-CM

## 2019-04-02 DIAGNOSIS — O99213 Obesity complicating pregnancy, third trimester: Secondary | ICD-10-CM

## 2019-04-02 DIAGNOSIS — Z283 Underimmunization status: Secondary | ICD-10-CM

## 2019-04-02 DIAGNOSIS — O99333 Smoking (tobacco) complicating pregnancy, third trimester: Secondary | ICD-10-CM

## 2019-04-03 ENCOUNTER — Encounter: Payer: Self-pay | Admitting: Obstetrics and Gynecology

## 2019-04-03 ENCOUNTER — Ambulatory Visit (INDEPENDENT_AMBULATORY_CARE_PROVIDER_SITE_OTHER): Payer: Medicaid Other | Admitting: Obstetrics and Gynecology

## 2019-04-03 ENCOUNTER — Telehealth: Payer: Self-pay | Admitting: Obstetrics and Gynecology

## 2019-04-03 VITALS — BP 110/73 | HR 98 | Wt 239.3 lb

## 2019-04-03 DIAGNOSIS — Z8759 Personal history of other complications of pregnancy, childbirth and the puerperium: Secondary | ICD-10-CM

## 2019-04-03 DIAGNOSIS — O365990 Maternal care for other known or suspected poor fetal growth, unspecified trimester, not applicable or unspecified: Secondary | ICD-10-CM

## 2019-04-03 DIAGNOSIS — Z3A31 31 weeks gestation of pregnancy: Secondary | ICD-10-CM

## 2019-04-03 DIAGNOSIS — O99820 Streptococcus B carrier state complicating pregnancy: Secondary | ICD-10-CM

## 2019-04-03 DIAGNOSIS — O99213 Obesity complicating pregnancy, third trimester: Secondary | ICD-10-CM

## 2019-04-03 DIAGNOSIS — O0990 Supervision of high risk pregnancy, unspecified, unspecified trimester: Secondary | ICD-10-CM | POA: Diagnosis not present

## 2019-04-03 DIAGNOSIS — O99210 Obesity complicating pregnancy, unspecified trimester: Secondary | ICD-10-CM

## 2019-04-03 DIAGNOSIS — R8271 Bacteriuria: Secondary | ICD-10-CM

## 2019-04-03 DIAGNOSIS — O0993 Supervision of high risk pregnancy, unspecified, third trimester: Secondary | ICD-10-CM

## 2019-04-03 DIAGNOSIS — O24419 Gestational diabetes mellitus in pregnancy, unspecified control: Secondary | ICD-10-CM | POA: Insufficient documentation

## 2019-04-03 DIAGNOSIS — O365930 Maternal care for other known or suspected poor fetal growth, third trimester, not applicable or unspecified: Secondary | ICD-10-CM

## 2019-04-03 LAB — CBC
Hematocrit: 35.5 % (ref 34.0–46.6)
Hemoglobin: 11.7 g/dL (ref 11.1–15.9)
MCH: 29.4 pg (ref 26.6–33.0)
MCHC: 33 g/dL (ref 31.5–35.7)
MCV: 89 fL (ref 79–97)
Platelets: 321 10*3/uL (ref 150–450)
RBC: 3.98 x10E6/uL (ref 3.77–5.28)
RDW: 13 % (ref 11.7–15.4)
WBC: 12.3 10*3/uL — ABNORMAL HIGH (ref 3.4–10.8)

## 2019-04-03 LAB — GLUCOSE TOLERANCE, 2 HOURS W/ 1HR
Glucose, 1 hour: 227 mg/dL — ABNORMAL HIGH (ref 65–179)
Glucose, 2 hour: 159 mg/dL — ABNORMAL HIGH (ref 65–152)
Glucose, Fasting: 132 mg/dL — ABNORMAL HIGH (ref 65–91)

## 2019-04-03 LAB — RPR: RPR Ser Ql: NONREACTIVE

## 2019-04-03 LAB — HIV ANTIBODY (ROUTINE TESTING W REFLEX): HIV Screen 4th Generation wRfx: NONREACTIVE

## 2019-04-03 NOTE — Telephone Encounter (Signed)
Patient called to speak to someone about her results she say in her MyChart. She wants to know if she is suppose to be on medicine for this?

## 2019-04-03 NOTE — Progress Notes (Signed)
Prenatal Visit Note Date: 04/03/2019 Clinic: [facility]  Subjective:  Joanna [name] is a 28 y.o. [phone] at [redacted]w[redacted]d being seen today for ongoing prenatal care.  She is currently monitored for the following issues for this high-risk pregnancy and has TOBACCO ABUSE; Mild intermittent asthma; Eczema; Maternal obesity affecting pregnancy, antepartum; Supervision of high risk pregnancy, antepartum; History of IUFD; Rubella non-immune status, antepartum; GBS bacteriuria; and Fetal growth retardation, antenatal on their problem list.  Patient reports no complaints.   Contractions: Not present. Vag. Bleeding: None.  Movement: Present. Denies leaking of fluid.   The following portions of the patient's history were reviewed and updated as appropriate: allergies, current medications, past family history, past medical history, past social history, past surgical history and problem list. Problem list updated.  Objective:   Vitals:   04/03/19 0933  BP: 110/73  Pulse: 98  Weight: 239 lb 4.8 oz (108.5 kg)    Fetal Status: Fetal Heart Rate (bpm): 154   Movement: Present     General:  Alert, oriented and cooperative. Patient is in no acute distress.  Skin: Skin is warm and dry. No rash noted.   Cardiovascular: Normal heart rate noted  Respiratory: Normal respiratory effort, no problems with respiration noted  Abdomen: Soft, gravid, appropriate for gestational age. Pain/Pressure: Absent     Pelvic:  Cervical exam deferred        Extremities: Normal range of motion.  Edema: None  Mental Status: Normal mood and affect. Normal behavior. Normal judgment and thought content.   Urinalysis:      Assessment and Plan:  Pregnancy: [id number] at [redacted]w[redacted]d  1. Supervision of high risk pregnancy, antepartum Routine care Ask about BC nv  2. Maternal obesity affecting pregnancy, antepartum stable  3. History of IUFD See below  4. GBS bacteriuria toc today  5. Fetal growth  retardation, antenatal Normal bpp and dopplers yesterday Getting MFM dopplers weekly. Will start NSTs with [place] next week [place] precautions given F/u subsequent scans for delivery timing  Preterm labor symptoms and general obstetric precautions including but not limited to vaginal bleeding, contractions, leaking of fluid and fetal movement were reviewed in detail with the patient. Please refer to After Visit Summary for other counseling recommendations.  Return in about 6 days (around 04/09/2019) for NST with diane day. 9/22 nst/hrob/in person.   Aletha Halim, MD

## 2019-04-04 NOTE — Telephone Encounter (Signed)
I called Joanna [name] and informed her that she does have GDM and a registrar will call her soon with first available appointment for diabetes education to learn how to check her blood sugar and what diet to follow. Then she will see a provider soon after that to review her blood sugars  To decide if she needs medication. She voices understanding. Ovid Witman,RN

## 2019-04-05 LAB — URINE CULTURE, OB REFLEX

## 2019-04-05 LAB — CULTURE, OB URINE

## 2019-04-08 ENCOUNTER — Telehealth: Payer: Self-pay

## 2019-04-08 DIAGNOSIS — O139 Gestational [pregnancy-induced] hypertension without significant proteinuria, unspecified trimester: Secondary | ICD-10-CM

## 2019-04-08 HISTORY — DX: Gestational (pregnancy-induced) hypertension without significant proteinuria, unspecified trimester: O13.9

## 2019-04-08 NOTE — Telephone Encounter (Signed)
Received BP Alert from [facility] reg Pts BP readings: Original was 143/92, took 2 more times & both times it was 143/66. Spoke with Provider Dr Ilda Basset, advised pt currently on lo dose Asprin, called pt and she stated is having headaches, no meds helping. Advised pt that she may want to go to MAU,or she can just get rechecked at her Nurse visit. Pt verbalized understanding.

## 2019-04-09 ENCOUNTER — Ambulatory Visit (HOSPITAL_COMMUNITY)
Admission: RE | Admit: 2019-04-09 | Discharge: 2019-04-09 | Disposition: A | Discharge status: Home / Self Care | Payer: Medicaid Other | Source: Ambulatory Visit | Attending: Maternal & Fetal Medicine | Admitting: Maternal & Fetal Medicine

## 2019-04-09 ENCOUNTER — Telehealth: Payer: Self-pay | Admitting: Family Medicine

## 2019-04-09 ENCOUNTER — Ambulatory Visit (HOSPITAL_COMMUNITY): Payer: Medicaid Other | Admitting: *Deleted

## 2019-04-09 ENCOUNTER — Telehealth: Payer: Self-pay | Admitting: Student

## 2019-04-09 ENCOUNTER — Other Ambulatory Visit (HOSPITAL_COMMUNITY): Payer: Self-pay | Admitting: *Deleted

## 2019-04-09 ENCOUNTER — Encounter (HOSPITAL_COMMUNITY): Payer: Self-pay

## 2019-04-09 ENCOUNTER — Other Ambulatory Visit: Payer: Self-pay

## 2019-04-09 DIAGNOSIS — Z8759 Personal history of other complications of pregnancy, childbirth and the puerperium: Secondary | ICD-10-CM | POA: Insufficient documentation

## 2019-04-09 DIAGNOSIS — Z2839 Other underimmunization status: Secondary | ICD-10-CM

## 2019-04-09 DIAGNOSIS — R8271 Bacteriuria: Secondary | ICD-10-CM

## 2019-04-09 DIAGNOSIS — O133 Gestational [pregnancy-induced] hypertension without significant proteinuria, third trimester: Secondary | ICD-10-CM | POA: Diagnosis present

## 2019-04-09 DIAGNOSIS — Z283 Underimmunization status: Secondary | ICD-10-CM | POA: Diagnosis present

## 2019-04-09 DIAGNOSIS — O99333 Smoking (tobacco) complicating pregnancy, third trimester: Secondary | ICD-10-CM

## 2019-04-09 DIAGNOSIS — Z3A32 32 weeks gestation of pregnancy: Secondary | ICD-10-CM | POA: Diagnosis not present

## 2019-04-09 DIAGNOSIS — O403XX0 Polyhydramnios, third trimester, not applicable or unspecified: Secondary | ICD-10-CM | POA: Diagnosis not present

## 2019-04-09 DIAGNOSIS — O24410 Gestational diabetes mellitus in pregnancy, diet controlled: Secondary | ICD-10-CM

## 2019-04-09 DIAGNOSIS — O9989 Other specified diseases and conditions complicating pregnancy, childbirth and the puerperium: Secondary | ICD-10-CM | POA: Diagnosis present

## 2019-04-09 DIAGNOSIS — O99213 Obesity complicating pregnancy, third trimester: Secondary | ICD-10-CM

## 2019-04-09 DIAGNOSIS — Z362 Encounter for other antenatal screening follow-up: Secondary | ICD-10-CM

## 2019-04-09 DIAGNOSIS — O09293 Supervision of pregnancy with other poor reproductive or obstetric history, third trimester: Secondary | ICD-10-CM

## 2019-04-09 NOTE — Telephone Encounter (Signed)
Spoke with patient about her appointment on 9/23 @ 9:15. Patient instructed that the appointment is a mychart visit. Patient instructed to download the mychart app if not already done so. Patient verbalized she has the app downloaded

## 2019-04-09 NOTE — Telephone Encounter (Signed)
Called the patient to inform of the cancellation of the appointment due to changes in the provider’s schedule. You will be contacted as early as tomorrow with an appointment update. If you have any question or concerns, please contact our office.  °

## 2019-04-10 ENCOUNTER — Telehealth (INDEPENDENT_AMBULATORY_CARE_PROVIDER_SITE_OTHER): Payer: Medicaid Other | Admitting: Obstetrics & Gynecology

## 2019-04-10 ENCOUNTER — Telehealth: Payer: Medicaid Other | Admitting: Family Medicine

## 2019-04-10 ENCOUNTER — Telehealth: Payer: Self-pay | Admitting: Family Medicine

## 2019-04-10 DIAGNOSIS — Z3A32 32 weeks gestation of pregnancy: Secondary | ICD-10-CM

## 2019-04-10 DIAGNOSIS — S91112A Laceration without foreign body of left great toe without damage to nail, initial encounter: Secondary | ICD-10-CM | POA: Diagnosis not present

## 2019-04-10 DIAGNOSIS — O9989 Other specified diseases and conditions complicating pregnancy, childbirth and the puerperium: Secondary | ICD-10-CM

## 2019-04-10 DIAGNOSIS — O09899 Supervision of other high risk pregnancies, unspecified trimester: Secondary | ICD-10-CM

## 2019-04-10 DIAGNOSIS — Z283 Underimmunization status: Secondary | ICD-10-CM

## 2019-04-10 DIAGNOSIS — R8271 Bacteriuria: Secondary | ICD-10-CM

## 2019-04-10 DIAGNOSIS — O133 Gestational [pregnancy-induced] hypertension without significant proteinuria, third trimester: Secondary | ICD-10-CM

## 2019-04-10 DIAGNOSIS — O99820 Streptococcus B carrier state complicating pregnancy: Secondary | ICD-10-CM

## 2019-04-10 MED ORDER — QVAR REDIHALER 40 MCG/ACT IN AERB: 1.0000 | INHALATION_SPRAY | Freq: Two times a day (BID) | RESPIRATORY_TRACT | 1 refills | Status: DC

## 2019-04-10 NOTE — Telephone Encounter (Signed)
Spoke to patient about her appointment on 9/24 @ 9:15. Patient instructed to wear a face mask for the entire appointment and no visitors are allowed with her during the visit. Patient screened for covid symptoms and denied having any °

## 2019-04-10 NOTE — Progress Notes (Signed)
TELEHEALTH OBSTETRICS PRENATAL VIRTUAL VIDEO VISIT ENCOUNTER NOTE  Provider location: [facility] [place]   I connected with Joanna [name] on 04/10/19 at  2:35 PM EDT by MyChart Video Encounter at home and verified that I am speaking with the correct person using two identifiers.   I discussed the limitations, risks, security and privacy concerns of performing an evaluation and management service virtually and the availability of in person appointments. I also discussed with the patient that there may be a patient responsible charge related to this service. The patient expressed understanding and agreed to proceed. Subjective:  Joanna [name] is a 28 y.o. [phone] at [redacted]w[redacted]d being seen today for ongoing prenatal care.  She is currently monitored for the following issues for this high-risk pregnancy and has TOBACCO ABUSE; Mild intermittent asthma; Eczema; Maternal obesity affecting pregnancy, antepartum; Supervision of high risk pregnancy, antepartum; History of IUFD; Rubella non-immune status, antepartum; GBS bacteriuria; Fetal growth retardation, antenatal; GDM (gestational diabetes mellitus); and Transient hypertension of pregnancy on their problem list.  Patient reports vaginal pressure.  Contractions: Not present. Vag. Bleeding: None.  Movement: Present. Denies any leaking of fluid.   The following portions of the patient's history were reviewed and updated as appropriate: allergies, current medications, past family history, past medical history, past social history, past surgical history and problem list.   Objective:   Vitals:   04/10/19 1445  BP: 124/66  Pulse: (!) 107    Fetal Status:     Movement: Present     General:  Alert, oriented and cooperative. Patient is in no acute distress.  Respiratory: Normal respiratory effort, no problems with respiration noted  Mental Status: Normal mood and affect. Normal behavior. Normal judgment and thought content.  Rest  of physical exam deferred due to type of encounter  Imaging: [place] Mfm Fetal Bpp Wo Non Stress  Result Date: 04/09/2019 ----------------------------------------------------------------------  OBSTETRICS REPORT                       (Signed Final 04/09/2019 05:03 pm) ---------------------------------------------------------------------- Patient Info  ID #:       [id number]                          D.O.B.:  1991-05-12 (28 yrs)  Name:       Joanna [name]                Visit Date: 04/09/2019 09:03 am ---------------------------------------------------------------------- Performed By  Performed By:     Birdena Crandall        Ref. Address:     Faculty                    RDMS,RVT  Attending:        Lin Landsman      [facility]                    MD                                       [facility]  Referred By:      Gigi Gin                    CONSTANT MD ---------------------------------------------------------------------- Orders   #  Description                          Code         Ordered By   1  [place] MFM FETAL BPP WO NON              16109.60     CORENTHIAN      STRESS                                            BOOKER   2  [place] MFM UA CORD DOPPLER               76820.02     Lin Landsman   3  [place] MFM OB FOLLOW UP                  45409.81     Lin Landsman  ----------------------------------------------------------------------   #  Order #                    Accession #                 Episode #   1  [id number]                  [id number]                  [id number]   2  [id number]                  [id number]   3  [id number]                  [id number]                  [id number]  ---------------------------------------------------------------------- Indications   Poor obstetric history: Previous IUFD          O09.299   (stillbirth)   Polyhydramnios,  third trimester, antepartum    O40.3XX0   condition or complication   Obesity complicating pregnancy, third          O99.213   trimester   Gestational diabetes in pregnancy, diet        O24.410   controlled   Tobacco use complicating pregnancy, third      O99.333   trimester   [redacted] weeks gestation of pregnancy                Z3A.32  ---------------------------------------------------------------------- Vital Signs  Weight (lb): 239  Height:        5'5"  BMI:         39.77 ---------------------------------------------------------------------- Fetal Evaluation  Num Of Fetuses:         1  Fetal Heart Rate(bpm):  146  Cardiac Activity:       Observed  Presentation:           Cephalic  Placenta:               Posterior  P. Cord Insertion:      Previously Visualized  Amniotic Fluid  AFI FV:      Polyhydramnios  AFI Sum(cm)     %Tile       Largest Pocket(cm)  28.18           > 97        9.54  RUQ(cm)       RLQ(cm)       LUQ(cm)        LLQ(cm)  6.13          6.96          8.07           7.02 ---------------------------------------------------------------------- Biophysical Evaluation  Amniotic F.V:   Within normal limits       F. Tone:        Observed  F. Movement:    Observed                   Score:          8/8  F. Breathing:   Observed ---------------------------------------------------------------------- Biometry  BPD:      80.1  mm     G. Age:  32w 1d         27  %    CI:        71.35   %    70 - 86                                                          FL/HC:      19.1   %    19.9 - 21.5  HC:       302   mm     G. Age:  33w 4d         33  %    HC/AC:      1.09        0.96 - 1.11  AC:      278.1  mm     G. Age:  31w 6d         26  %    FL/BPD:     72.0   %    71 - 87  FL:       57.7  mm     G. Age:  30w 1d          2  %    FL/AC:      20.7   %    20 - 24  Est. FW:    1789  gm    3 lb 15 oz      12  % ---------------------------------------------------------------------- OB History   Gravidity:    6         Term:   4        Prem:  1        SAB:   0  TOP:          0       Ectopic:  0        Living: 4 ---------------------------------------------------------------------- Gestational Age  LMP:           32w 5d        Date:  08/23/18                 EDD:   05/30/19  U/S Today:     32w 0d                                        EDD:   06/04/19  Best:          32w 5d     Det. By:  LMP  (08/23/18)          EDD:   05/30/19 ---------------------------------------------------------------------- Anatomy  Cranium:               Appears normal         Aortic Arch:            Previously seen  Cavum:                 Appears normal         Ductal Arch:            Previously seen  Ventricles:            Appears normal         Diaphragm:              Appears normal  Choroid Plexus:        Previously seen        Stomach:                Appears normal, left                                                                        sided  Cerebellum:            Appears normal         Abdomen:                Appears normal  Posterior Fossa:       Appears normal         Abdominal Wall:         Previously seen  Nuchal Fold:           Not applicable (>40    Cord Vessels:           Appears normal ([redacted]                         wks GA)                                        vessel cord)  Face:  Orbits and profile     Kidneys:                Appear normal                         previously seen  Lips:                  Appears normal         Bladder:                Appears normal  Thoracic:              Appears normal         Spine:                  Previously seen  Heart:                 Appears normal         Upper Extremities:      Previously seen                         (4CH, axis, and                         situs)  RVOT:                  Appears normal         Lower Extremities:      Previously seen  LVOT:                  Appears normal  Other:  Fetus appears to be a female.  ---------------------------------------------------------------------- Doppler - Fetal Vessels  Umbilical Artery   S/D     %tile     RI              PI   2.6       49   0.62             0.95 ---------------------------------------------------------------------- Cervix Uterus Adnexa  Cervix  Not visualized (advanced GA >24wks) ---------------------------------------------------------------------- Impression  Normal interval growth.  Prior IUGR (resolved today)  A1GDM  Polyhydramnios- I discussed todays finding of mild  polyhydramnios. We discussed the causes including GDM,  Diabetes, idiopathic, aneuploidy and other neurologic  syndromes. At this time we suspect this is related to  idiopathic polyhydramnios or GDM. She has not begun  checking her blood sugars yet as she has not recieved her  glucometer or had teaching.  Normal UA Dopplers  Prior history of IUFD at 35 weeks ---------------------------------------------------------------------- Recommendations  Continue weekly testing  Consider delivery betwen 37-39 weeks given blood sugar  control and fetal testing. ----------------------------------------------------------------------               Lin Landsman, MD Electronically Signed Final Report   04/09/2019 05:03 pm ----------------------------------------------------------------------  [place] Mfm Fetal Bpp Wo Non Stress  Result Date: 04/02/2019 ----------------------------------------------------------------------  OBSTETRICS REPORT                       (Signed Final 04/02/2019 09:19 am) ---------------------------------------------------------------------- Patient Info  ID #:       [id number]                          D.O.B.:  04-Jan-1991 (28 yrs)  Name:  Joanna [name]                Visit Date: 04/02/2019 08:55 am ---------------------------------------------------------------------- Performed By  Performed By:     Emeline Darling BS,      Ref. Address:     Faculty                    RDMS  Attending:         Lin Landsman      [facility]                    MD                                       [facility]etal [facility]  Referred By:      Catalina Antigua MD ---------------------------------------------------------------------- Orders   #  Description                          Code         Ordered By   1  [place] MFM UA CORD DOPPLER               76820.02     CHARLIE PICKENS   2  [place] MFM FETAL BPP WO NON              76819.01     CHARLIE PICKENS      STRESS  ----------------------------------------------------------------------   #  Order #                    Accession #                 Episode #   1  [id number]                  [id number]                  [id number]   2  [id number]                  [id number]                  [id number]  ---------------------------------------------------------------------- Indications   [redacted] weeks gestation of pregnancy                Z3A.31   Poor obstetric history: Previous IUFD          O09.299   (stillbirth)   Obesity complicating pregnancy, third          O99.213   trimester   Tobacco use complicating pregnancy, third      O99.333   trimester  ---------------------------------------------------------------------- Vital Signs  Weight (lb): 239                               Height:        5'5"  BMI:         39.77 ---------------------------------------------------------------------- Fetal Evaluation  Num Of Fetuses:         1  Fetal Heart Rate(bpm):  142  Cardiac Activity:       Observed  Presentation:  Cephalic  Amniotic Fluid  AFI FV:      Within normal limits  AFI Sum(cm)     %Tile       Largest Pocket(cm)  22.08           86          7.37  RUQ(cm)       RLQ(cm)       LUQ(cm)        LLQ(cm)  5.74          7.37          5.12           3.85 ---------------------------------------------------------------------- OB History  Gravidity:    6         Term:   4        Prem:   1        SAB:   0  TOP:          0       Ectopic:  0        Living: 4  ---------------------------------------------------------------------- Gestational Age  LMP:           31w 5d        Date:  08/23/18                 EDD:   05/30/19  Best:          31w 5d     Det. By:  LMP  (08/23/18)          EDD:   05/30/19 ---------------------------------------------------------------------- Doppler - Fetal Vessels  Umbilical Artery   S/D     %tile     RI                                     ADFV    RDFV  2.98       66   0.66                                         No      No ---------------------------------------------------------------------- Impression  Known IUGR  Prior IUFD at 35 weeks  Biophysical profile 8/8  Normal UA Dopplers ---------------------------------------------------------------------- Recommendations  Continue weekly testing with UA Dopplers  Repeat growth in 3 weeks.  Daily kickcounts ----------------------------------------------------------------------               Lin Landsman, MD Electronically Signed Final Report   04/02/2019 09:19 am ----------------------------------------------------------------------  [place] Mfm Ob Follow Up  Result Date: 04/09/2019 ----------------------------------------------------------------------  OBSTETRICS REPORT                       (Signed Final 04/09/2019 05:03 pm) ---------------------------------------------------------------------- Patient Info  ID #:       [id number]                          D.O.B.:  1990/12/04 (28 yrs)  Name:       Joanna [name]                Visit Date: 04/09/2019 09:03 am ---------------------------------------------------------------------- Performed By  Performed By:     Birdena Crandall        Ref. Address:     Faculty  RDMS,RVT  Attending:        Lin Landsman      [facility]                    MD                                       [facility]etal Care  Referred By:      Gigi Gin                    CONSTANT MD  ---------------------------------------------------------------------- Orders   #  Description                          Code         Ordered By   1  [place] MFM FETAL BPP WO NON              76819.01     CORENTHIAN      STRESS                                            BOOKER   2  [place] MFM UA CORD DOPPLER               76820.02     CORENTHIAN                                                        BOOKER   3  [place] MFM OB FOLLOW UP                  16109.60     Lin Landsman  ----------------------------------------------------------------------   #  Order #                    Accession #                 Episode #   1  [id number]                  [id number]   2  [id number]                  [id number]                  [id number]   3  [id number]                  [id number]                  [id number]  ---------------------------------------------------------------------- Indications   Poor obstetric history: Previous IUFD  O09.299   (stillbirth)   Polyhydramnios, third trimester, antepartum    O40.3XX0   condition or complication   Obesity complicating pregnancy, third          O99.213   trimester   Gestational diabetes in pregnancy, diet        O24.410   controlled   Tobacco use complicating pregnancy, third      O99.333   trimester   [redacted] weeks gestation of pregnancy                Z3A.32  ---------------------------------------------------------------------- Vital Signs  Weight (lb): 239                               Height:        5'5"  BMI:         39.77 ---------------------------------------------------------------------- Fetal Evaluation  Num Of Fetuses:         1  Fetal Heart Rate(bpm):  146  Cardiac Activity:       Observed  Presentation:           Cephalic  Placenta:               Posterior  P. Cord Insertion:      Previously Visualized  Amniotic Fluid  AFI FV:      Polyhydramnios  AFI Sum(cm)     %Tile       Largest Pocket(cm)   28.18           > 97        9.54  RUQ(cm)       RLQ(cm)       LUQ(cm)        LLQ(cm)  6.13          6.96          8.07           7.02 ---------------------------------------------------------------------- Biophysical Evaluation  Amniotic F.V:   Within normal limits       F. Tone:        Observed  F. Movement:    Observed                   Score:          8/8  F. Breathing:   Observed ---------------------------------------------------------------------- Biometry  BPD:      80.1  mm     G. Age:  32w 1d         27  %    CI:        71.35   %    70 - 86                                                          FL/HC:      19.1   %    19.9 - 21.5  HC:       302   mm     G. Age:  33w 4d         33  %    HC/AC:      1.09        0.96 - 1.11  AC:      278.1  mm     G. Age:  31w 6d  26  %    FL/BPD:     72.0   %    71 - 87  FL:       57.7  mm     G. Age:  30w 1d          2  %    FL/AC:      20.7   %    20 - 24  Est. FW:    1789  gm    3 lb 15 oz      12  % ---------------------------------------------------------------------- OB History  Gravidity:    6         Term:   4        Prem:   1        SAB:   0  TOP:          0       Ectopic:  0        Living: 4 ---------------------------------------------------------------------- Gestational Age  LMP:           32w 5d        Date:  08/23/18                 EDD:   05/30/19  U/S Today:     32w 0d                                        EDD:   06/04/19  Best:          32w 5d     Det. By:  LMP  (08/23/18)          EDD:   05/30/19 ---------------------------------------------------------------------- Anatomy  Cranium:               Appears normal         Aortic Arch:            Previously seen  Cavum:                 Appears normal         Ductal Arch:            Previously seen  Ventricles:            Appears normal         Diaphragm:              Appears normal  Choroid Plexus:        Previously seen        Stomach:                Appears normal, left                                                                         sided  Cerebellum:            Appears normal         Abdomen:                Appears normal  Posterior Fossa:       Appears normal  Abdominal Wall:         Previously seen  Nuchal Fold:           Not applicable (>20    Cord Vessels:           Appears normal ([redacted]                         wks GA)                                        vessel cord)  Face:                  Orbits and profile     Kidneys:                Appear normal                         previously seen  Lips:                  Appears normal         Bladder:                Appears normal  Thoracic:              Appears normal         Spine:                  Previously seen  Heart:                 Appears normal         Upper Extremities:      Previously seen                         (4CH, axis, and                         situs)  RVOT:                  Appears normal         Lower Extremities:      Previously seen  LVOT:                  Appears normal  Other:  Fetus appears to be a female. ---------------------------------------------------------------------- Doppler - Fetal Vessels  Umbilical Artery   S/D     %tile     RI              PI   2.6       49   0.62             0.95 ---------------------------------------------------------------------- Cervix Uterus Adnexa  Cervix  Not visualized (advanced GA >24wks) ---------------------------------------------------------------------- Impression  Normal interval growth.  Prior IUGR (resolved today)  A1GDM  Polyhydramnios- I discussed todays finding of mild  polyhydramnios. We discussed the causes including GDM,  Diabetes, idiopathic, aneuploidy and other neurologic  syndromes. At this time we suspect this is related to  idiopathic polyhydramnios or GDM. She has not begun  checking her blood sugars yet as she has not recieved her  glucometer or had teaching.  Normal UA Dopplers  Prior history of IUFD at 35 weeks  ---------------------------------------------------------------------- Recommendations  Continue weekly testing  Consider delivery  betwen 37-39 weeks given blood sugar  control and fetal testing. ----------------------------------------------------------------------               Lin Landsman, MD Electronically Signed Final Report   04/09/2019 05:03 pm ----------------------------------------------------------------------  [place] Mfm Ob Follow Up  Result Date: 03/19/2019 ----------------------------------------------------------------------  OBSTETRICS REPORT                       (Signed Final 03/19/2019 10:27 am) ---------------------------------------------------------------------- Patient Info  ID #:       [id number]                          D.O.B.:  1991-07-05 (28 yrs)  Name:       Joanna Masker Brosnahan                Visit Date: 03/19/2019 08:56 am ---------------------------------------------------------------------- Performed By  Performed By:     Emeline Darling BS,      Ref. Address:     Faculty                    RDMS  Attending:        Lin Landsman      [facility]                    MD                                       [facility]etal Care  Referred By:      Catalina Antigua MD ---------------------------------------------------------------------- Orders   #  Description                          Code         Ordered By   1  [place] MFM OB FOLLOW UP                  38101.75     RAVI SHANKAR  ----------------------------------------------------------------------   #  Order #                    Accession #                 Episode #   1  [id number]                  [id number]                  [id number]  ---------------------------------------------------------------------- Indications   [redacted] weeks gestation of pregnancy                Z3A.29   Poor obstetric history: Previous IUFD          O09.299   (stillbirth)   Obesity complicating pregnancy, third          O99.213    trimester   Encounter for other antenatal screening        Z36.2   follow-up   Tobacco use complicating pregnancy, third      O99.333   trimester  ---------------------------------------------------------------------- Vital Signs  Weight (lb): 223  Height:        5'5"  BMI:         37.11 ---------------------------------------------------------------------- Fetal Evaluation  Num Of Fetuses:         1  Fetal Heart Rate(bpm):  145  Cardiac Activity:       Observed  Presentation:           Breech  Placenta:               Posterior  P. Cord Insertion:      Previously Visualized  Amniotic Fluid  AFI FV:      Within normal limits  AFI Sum(cm)     %Tile       Largest Pocket(cm)  18.99           73          5.51  RUQ(cm)       RLQ(cm)       LUQ(cm)        LLQ(cm)  5.15          5.36          5.51           2.97 ---------------------------------------------------------------------- Biometry  BPD:      72.2  mm     G. Age:  29w 0d         17  %    CI:        72.73   %    70 - 86                                                          FL/HC:      19.7   %    19.2 - 21.4  HC:      269.2  mm     G. Age:  29w 2d         10  %    HC/AC:      1.13        0.99 - 1.21  AC:       239   mm     G. Age:  28w 1d          9  %    FL/BPD:     73.4   %    71 - 87  FL:         53  mm     G. Age:  28w 1d          6  %    FL/AC:      22.2   %    20 - 24  Est. FW:    1213  gm    2 lb 11 oz       6  % ---------------------------------------------------------------------- OB History  Gravidity:    6         Term:   4        Prem:   1        SAB:   0  TOP:          0       Ectopic:  0        Living: 4 ---------------------------------------------------------------------- Gestational Age  LMP:           29w 5d  Date:  08/23/18                 EDD:   05/30/19  U/S Today:     28w 5d                                        EDD:   06/06/19  Best:          29w 5d     Det. By:  LMP  (08/23/18)          EDD:   05/30/19  ---------------------------------------------------------------------- Anatomy  Cranium:               Appears normal         LVOT:                   Previously seen  Cavum:                 Previously seen        Aortic Arch:            Previously seen  Ventricles:            Previously seen        Ductal Arch:            Previously seen  Choroid Plexus:        Previously seen        Diaphragm:              Previously seen  Cerebellum:            Appears normal         Stomach:                Appears normal, left                                                                        sided  Posterior Fossa:       Appears normal         Abdomen:                Appears normal  Nuchal Fold:           Not applicable (>20    Abdominal Wall:         Previously seen                         wks GA)  Face:                  Orbits and profile     Cord Vessels:           Previously seen                         previously seen  Lips:                  Previously seen        Kidneys:                Appear normal  Palate:  Previously seen        Bladder:                Appears normal  Thoracic:              Appears normal         Spine:                  Previously seen  Heart:                 Appears normal         Upper Extremities:      Previously seen                         (4CH, axis, and                         situs)  RVOT:                  Previously seen        Lower Extremities:      Previously seen  Other:  Heels and 5th digit previously seen. Fetus appears to be a female.          Nasal bone previously visualized. ---------------------------------------------------------------------- Doppler - Fetal Vessels  Umbilical Artery   S/D     %tile     RI              PI                     ADFV    RDFV   3.7       87   0.73             1.26                        No      No ---------------------------------------------------------------------- Impression  IUGR with EFW 6th% and AC 9th%  Normal UA Dopplers  NST -  reactive ---------------------------------------------------------------------- Recommendations  Follow up UA Dopplers in 2 weeks  Growth in 4 weeks. ----------------------------------------------------------------------               Lin Landsman, MD Electronically Signed Final Report   03/19/2019 10:27 am ----------------------------------------------------------------------  [place] Mfm Ua Cord Doppler  Result Date: 04/09/2019 ----------------------------------------------------------------------  OBSTETRICS REPORT                       (Signed Final 04/09/2019 05:03 pm) ---------------------------------------------------------------------- Patient Info  ID #:       [id number]                          D.O.B.:  [birth year] (28 yrs)  Name:       Joanna [name]                Visit Date: 04/09/2019 09:03 am ---------------------------------------------------------------------- Performed By  Performed By:     Birdena Crandall        Ref. Address:     Faculty                    RDMS,RVT  Attending:        Lin Landsman      [facility]  Referred By:      Gigi Gin                    CONSTANT MD ---------------------------------------------------------------------- Orders   #  Description                          Code         Ordered By   1  [place] MFM FETAL BPP WO NON              16109.60     CORENTHIAN      STRESS                                            BOOKER   2  [place] MFM UA CORD DOPPLER               76820.02     CORENTHIAN                                                        BOOKER   3  [place] MFM OB FOLLOW UP                  45409.81     Lin Landsman  ----------------------------------------------------------------------   #  Order #                    Accession #                 Episode #   1  [id number]                  [id number]    2  [id number]                  [id number]   3  [id number]                  [id number]                  [id number]  ---------------------------------------------------------------------- Indications   Poor obstetric history: Previous IUFD          O09.299   (stillbirth)   Polyhydramnios, third trimester, antepartum    O40.3XX0   condition or complication   Obesity complicating pregnancy, third          O99.213   trimester   Gestational diabetes in pregnancy,  diet        O24.410   controlled   Tobacco use complicating pregnancy, third      O99.333   trimester   [redacted] weeks gestation of pregnancy                Z3A.32  ---------------------------------------------------------------------- Vital Signs  Weight (lb): 239                               Height:        5'5"  BMI:         39.77 ---------------------------------------------------------------------- Fetal Evaluation  Num Of Fetuses:         1  Fetal Heart Rate(bpm):  146  Cardiac Activity:       Observed  Presentation:           Cephalic  Placenta:               Posterior  P. Cord Insertion:      Previously Visualized  Amniotic Fluid  AFI FV:      Polyhydramnios  AFI Sum(cm)     %Tile       Largest Pocket(cm)  28.18           > 97        9.54  RUQ(cm)       RLQ(cm)       LUQ(cm)        LLQ(cm)  6.13          6.96          8.07           7.02 ---------------------------------------------------------------------- Biophysical Evaluation  Amniotic F.V:   Within normal limits       F. Tone:        Observed  F. Movement:    Observed                   Score:          8/8  F. Breathing:   Observed ---------------------------------------------------------------------- Biometry  BPD:      80.1  mm     G. Age:  32w 1d         27  %    CI:        71.35   %    70 - 86                                                          FL/HC:      19.1   %    19.9 - 21.5  HC:       302   mm     G. Age:  33w 4d         33  %    HC/AC:      1.09        0.96 - 1.11   AC:      278.1  mm     G. Age:  31w 6d         26  %    FL/BPD:     72.0   %    71 - 87  FL:       57.7  mm  G. Age:  30w 1d          2  %    FL/AC:      20.7   %    20 - 24  Est. FW:    1789  gm    3 lb 15 oz      12  % ---------------------------------------------------------------------- OB History  Gravidity:    6         Term:   4        Prem:   1        SAB:   0  TOP:          0       Ectopic:  0        Living: 4 ---------------------------------------------------------------------- Gestational Age  LMP:           32w 5d        Date:  08/23/18                 EDD:   05/30/19  U/S Today:     32w 0d                                        EDD:   06/04/19  Best:          32w 5d     Det. By:  LMP  (08/23/18)          EDD:   05/30/19 ---------------------------------------------------------------------- Anatomy  Cranium:               Appears normal         Aortic Arch:            Previously seen  Cavum:                 Appears normal         Ductal Arch:            Previously seen  Ventricles:            Appears normal         Diaphragm:              Appears normal  Choroid Plexus:        Previously seen        Stomach:                Appears normal, left                                                                        sided  Cerebellum:            Appears normal         Abdomen:                Appears normal  Posterior Fossa:       Appears normal         Abdominal Wall:         Previously seen  Nuchal Fold:           Not applicable (>20    Cord Vessels:  Appears normal ([redacted]                         wks GA)                                        vessel cord)  Face:                  Orbits and profile     Kidneys:                Appear normal                         previously seen  Lips:                  Appears normal         Bladder:                Appears normal  Thoracic:              Appears normal         Spine:                  Previously seen  Heart:                 Appears normal          Upper Extremities:      Previously seen                         (4CH, axis, and                         situs)  RVOT:                  Appears normal         Lower Extremities:      Previously seen  LVOT:                  Appears normal  Other:  Fetus appears to be a female. ---------------------------------------------------------------------- Doppler - Fetal Vessels  Umbilical Artery   S/D     %tile     RI              PI   2.6       49   0.62             0.95 ---------------------------------------------------------------------- Cervix Uterus Adnexa  Cervix  Not visualized (advanced GA >24wks) ---------------------------------------------------------------------- Impression  Normal interval growth.  Prior IUGR (resolved today)  A1GDM  Polyhydramnios- I discussed todays finding of mild  polyhydramnios. We discussed the causes including GDM,  Diabetes, idiopathic, aneuploidy and other neurologic  syndromes. At this time we suspect this is related to  idiopathic polyhydramnios or GDM. She has not begun  checking her blood sugars yet as she has not recieved her  glucometer or had teaching.  Normal UA Dopplers  Prior history of IUFD at 35 weeks ---------------------------------------------------------------------- Recommendations  Continue weekly testing  Consider delivery betwen 37-39 weeks given blood sugar  control and fetal testing. ----------------------------------------------------------------------               Lin Landsman, MD Electronically Signed Final Report   04/09/2019 05:03 pm ----------------------------------------------------------------------  [place] Mfm Ua  Cord Doppler  Result Date: 04/02/2019 ----------------------------------------------------------------------  OBSTETRICS REPORT                       (Signed Final 04/02/2019 09:19 am) ---------------------------------------------------------------------- Patient Info  ID #:       [id number]                          D.O.B.:  1990/10/05 (28 yrs)   Name:       Joanna [name]                Visit Date: 04/02/2019 08:55 am ---------------------------------------------------------------------- Performed By  Performed By:     Emeline Darling BS,      Ref. Address:     Faculty                    RDMS  Attending:        Lin Landsman      [facility]                                       [facility]  Referred By:      Catalina Antigua MD ---------------------------------------------------------------------- Orders   #  Description                          Code         Ordered By   1  [place] MFM UA CORD DOPPLER               76820.02     CHARLIE PICKENS   2  [place] MFM FETAL BPP WO NON              76819.01     CHARLIE PICKENS      STRESS  ----------------------------------------------------------------------   #  Order #                    Accession #                 Episode #   1  [id number]                  [id number]                  [id number]   2  [id number]                  [id number]                  [id number]  ---------------------------------------------------------------------- Indications   [redacted] weeks gestation of pregnancy                Z3A.31   Poor obstetric history: Previous IUFD          O09.299   (stillbirth)   Obesity complicating pregnancy, third          O99.213   trimester   Tobacco use complicating pregnancy, third      O99.333   trimester  ---------------------------------------------------------------------- Vital Signs  Weight (lb): 239                               Height:        5'5"  BMI:         39.77 ---------------------------------------------------------------------- Fetal Evaluation  Num Of Fetuses:         1  Fetal Heart Rate(bpm):  142  Cardiac Activity:       Observed  Presentation:           Cephalic  Amniotic Fluid  AFI FV:      Within normal limits  AFI Sum(cm)     %Tile       Largest Pocket(cm)  22.08           86          7.37  RUQ(cm)       RLQ(cm)       LUQ(cm)         LLQ(cm)  5.74          7.37          5.12           3.85 ---------------------------------------------------------------------- OB History  Gravidity:    6         Term:   4        Prem:   1        SAB:   0  TOP:          0       Ectopic:  0        Living: 4 ---------------------------------------------------------------------- Gestational Age  LMP:           31w 5d        Date:  08/23/18                 EDD:   05/30/19  Best:          31w 5d     Det. By:  LMP  (08/23/18)          EDD:   05/30/19 ---------------------------------------------------------------------- Doppler - Fetal Vessels  Umbilical Artery   S/D     %tile     RI                                     ADFV    RDFV  2.98       66   0.66                                         No      No ---------------------------------------------------------------------- Impression  Known IUGR  Prior IUFD at 35 weeks  Biophysical profile 8/8  Normal UA Dopplers ---------------------------------------------------------------------- Recommendations  Continue weekly testing with UA Dopplers  Repeat growth in 3 weeks.  Daily kickcounts ----------------------------------------------------------------------               Lin Landsman, MD Electronically Signed Final Report   04/02/2019 09:19 am ----------------------------------------------------------------------   Assessment and Plan:  Pregnancy: [id number] at [redacted]w[redacted]d 1. Transient hypertension of pregnancy in third trimester Pt taking BP; Elevations on 04/08/19.  Pt should have PIH labs drawn tomorrow when she comes in.  She is already in testing.  Pt encouraged to take BP daily  so that we can have more information.     2. GBS bacteriuria Rx in labor  3. Rubella non-immune status, antepartum Vaccine after delivery  4. GDM Has education tomorrow.    5. Polyhydramnios and EFW 12% In testing with MFM Fetal Kick counts  6. Vaginal pressure Pt wants cervical exam; she coming in tomorrow and will add this  on1.   Preterm labor symptoms and general obstetric precautions including but not limited to vaginal bleeding, contractions, leaking of fluid and fetal movement were reviewed in detail with the patient. I discussed the assessment and treatment plan with the patient. The patient was provided an opportunity to ask questions and all were answered. The patient agreed with the plan and demonstrated an understanding of the instructions. The patient was advised to call back or seek an in-person office evaluation/go to [facility] at [facility] for any urgent or concerning symptoms. Please refer to After Visit Summary for other counseling recommendations.   I provided 20 minutes of face-to-face time during this encounter.  Return in about 1 day (around 04/11/2019).  Future Appointments  Date Time Provider Department Center  04/11/2019  9:15 AM WOC-WOCA NST WOC-WOCA WOC  04/11/2019 10:15 AM WOC-EDUCATION WOC-WOCA WOC  04/18/2019 10:15 AM Adam Phenix, MD WOC-WOCA WOC  04/18/2019 11:15 AM WOC-WOCA NST WOC-WOCA WOC  04/24/2019 10:15 AM Reva Bores, MD WOC-WOCA WOC  04/24/2019 11:15 AM WOC-WOCA NST WOC-WOCA WOC  04/30/2019  7:45 AM WH-MFC NURSE WH-MFC MFC-US  04/30/2019  7:45 AM WH-MFC [place] 2 WH-MFCUS MFC-US  05/01/2019  9:15 AM WOC-WOCA NST WOC-WOCA WOC  05/01/2019 10:15 AM Constant, Gigi Gin, MD WOC-WOCA WOC  05/08/2019  9:15 AM WOC-WOCA NST WOC-WOCA WOC  05/08/2019 10:15 AM Adam Phenix, MD WOC-WOCA WOC  05/15/2019  9:15 AM WOC-WOCA NST WOC-WOCA WOC  05/15/2019 10:15 AM Hermina Staggers, MD WOC-WOCA WOC  05/22/2019  9:15 AM WOC-WOCA NST WOC-WOCA WOC  05/22/2019 10:15 AM Reva Bores, MD WOC-WOCA WOC  05/29/2019  9:15 AM WOC-WOCA NST WOC-WOCA WOC  05/29/2019 10:15 AM Anyanwu, Jethro Bastos, MD WOC-WOCA WOC    Elsie Lincoln, MD [facility], [facility]

## 2019-04-11 ENCOUNTER — Other Ambulatory Visit: Payer: Self-pay

## 2019-04-11 ENCOUNTER — Ambulatory Visit: Payer: Self-pay

## 2019-04-11 ENCOUNTER — Ambulatory Visit (INDEPENDENT_AMBULATORY_CARE_PROVIDER_SITE_OTHER): Payer: Medicaid Other | Admitting: *Deleted

## 2019-04-11 ENCOUNTER — Encounter: Payer: Medicaid Other | Attending: Obstetrics & Gynecology | Admitting: *Deleted

## 2019-04-11 ENCOUNTER — Ambulatory Visit: Payer: Medicaid Other | Admitting: *Deleted

## 2019-04-11 VITALS — BP 120/67 | HR 95 | Wt 239.8 lb

## 2019-04-11 DIAGNOSIS — Z3A33 33 weeks gestation of pregnancy: Secondary | ICD-10-CM | POA: Diagnosis not present

## 2019-04-11 DIAGNOSIS — Z713 Dietary counseling and surveillance: Secondary | ICD-10-CM | POA: Insufficient documentation

## 2019-04-11 DIAGNOSIS — O24419 Gestational diabetes mellitus in pregnancy, unspecified control: Secondary | ICD-10-CM

## 2019-04-11 DIAGNOSIS — Z8759 Personal history of other complications of pregnancy, childbirth and the puerperium: Secondary | ICD-10-CM

## 2019-04-11 DIAGNOSIS — O133 Gestational [pregnancy-induced] hypertension without significant proteinuria, third trimester: Secondary | ICD-10-CM

## 2019-04-11 DIAGNOSIS — O24410 Gestational diabetes mellitus in pregnancy, diet controlled: Secondary | ICD-10-CM

## 2019-04-11 MED ORDER — ACCU-CHEK GUIDE W/DEVICE KIT: 1.0000 | PACK | Freq: Once | 0 refills | Status: AC

## 2019-04-11 MED ORDER — ACCU-CHEK GUIDE VI STRP: ORAL_STRIP | 12 refills | Status: DC

## 2019-04-11 MED ORDER — ACCU-CHEK FASTCLIX LANCETS MISC: 1.0000 | Freq: Four times a day (QID) | 12 refills | Status: DC

## 2019-04-11 NOTE — Progress Notes (Signed)

## 2019-04-11 NOTE — Progress Notes (Signed)
  Patient was seen on 04/11/2019 for Gestational Diabetes self-management. EDD 05/30/2019. Patient states no history of GDM but states her mother had diabetes. Diet history obtained. Patient eats good variety of all food groups. Beverages include mostly high sugar drinks including coffee with 4-5 sugar packets, regular soda twice daily as well as juice and gatorade.  The following learning objectives were met by the patient :   States the definition of Gestational Diabetes  States why dietary management is important in controlling blood glucose  Describes the effects of carbohydrates on blood glucose levels  Demonstrates ability to create a balanced meal plan  Demonstrates carbohydrate counting   States when to check blood glucose levels  Demonstrates proper blood glucose monitoring techniques  States the effect of stress and exercise on blood glucose levels  States the importance of limiting caffeine and abstaining from alcohol and smoking  Plan:  Aim for 3 Carb Choices per meal (45 grams) +/- 1 either way  Aim for 1-2 Carbs per snack Be aware of high sugar (and thus carbohydrate) in regular beverages and fruit juices Begin reading food labels for Total Carbohydrate of foods If OK with your MD, consider  increasing your activity level by walking, Arm Chair Exercises or other activity daily as tolerated Begin checking BG before breakfast and 2 hours after first bite of breakfast, lunch and dinner as directed by MD  Bring Log Book/Sheet and meter to every medical appointment OR use Baby Scripts (see below) Baby Scripts:  Patient was introduced to [employer] and plans to use as record of BG electronically  Take medication if directed by MD  Blood glucose monitor Rx called into pharmacy: Accu Check Guide with Fast Clix drums Patient instructed to test pre breakfast and 2 hours each meal as directed by MD  Patient instructed to monitor glucose levels: FBS: 60 - 95 mg/dl 2 hour:  <120 mg/dl  Patient received the following handouts:  Nutrition Diabetes and Pregnancy  Carbohydrate Counting List  Patient will be seen for follow-up as needed.

## 2019-04-12 ENCOUNTER — Other Ambulatory Visit: Payer: Self-pay | Admitting: General Practice

## 2019-04-12 DIAGNOSIS — O24410 Gestational diabetes mellitus in pregnancy, diet controlled: Secondary | ICD-10-CM

## 2019-04-12 LAB — COMPREHENSIVE METABOLIC PANEL
ALT: 12 IU/L (ref 0–32)
AST: 7 IU/L (ref 0–40)
Albumin/Globulin Ratio: 1.1 — ABNORMAL LOW (ref 1.2–2.2)
Albumin: 3.2 g/dL — ABNORMAL LOW (ref 3.9–5.0)
Alkaline Phosphatase: 126 IU/L — ABNORMAL HIGH (ref 39–117)
BUN/Creatinine Ratio: 9 (ref 9–23)
BUN: 6 mg/dL (ref 6–20)
Bilirubin Total: <0.2 mg/dL (ref 0.0–1.2)
CO2: 20 mmol/L (ref 20–29)
Calcium: 9.5 mg/dL (ref 8.7–10.2)
Chloride: 105 mmol/L (ref 96–106)
Creatinine, Ser: 0.66 mg/dL (ref 0.57–1.00)
GFR calc Af Amer: 140 mL/min/{1.73_m2} (ref >59)
GFR calc non Af Amer: 121 mL/min/{1.73_m2} (ref >59)
Globulin, Total: 2.9 g/dL (ref 1.5–4.5)
Glucose: 138 mg/dL — ABNORMAL HIGH (ref 65–99)
Potassium: 4.2 mmol/L (ref 3.5–5.2)
Sodium: 138 mmol/L (ref 134–144)
Total Protein: 6.1 g/dL (ref 6.0–8.5)

## 2019-04-12 LAB — CBC
Hematocrit: 34.4 % (ref 34.0–46.6)
Hemoglobin: 11.6 g/dL (ref 11.1–15.9)
MCH: 30.1 pg (ref 26.6–33.0)
MCHC: 33.7 g/dL (ref 31.5–35.7)
MCV: 89 fL (ref 79–97)
Platelets: 307 10*3/uL (ref 150–450)
RBC: 3.86 x10E6/uL (ref 3.77–5.28)
RDW: 13.2 % (ref 11.7–15.4)
WBC: 11.9 10*3/uL — ABNORMAL HIGH (ref 3.4–10.8)

## 2019-04-12 LAB — PROTEIN / CREATININE RATIO, URINE
Creatinine, Urine: 227.1 mg/dL
Protein, Ur: 24.6 mg/dL
Protein/Creat Ratio: 108 mg/g creat (ref 0–200)

## 2019-04-12 MED ORDER — ACCU-CHEK AVIVA PLUS W/DEVICE KIT: 1.0000 | PACK | Freq: Once | 0 refills | Status: AC

## 2019-04-12 MED ORDER — ACCU-CHEK AVIVA PLUS VI STRP: ORAL_STRIP | 12 refills | Status: DC

## 2019-04-14 ENCOUNTER — Other Ambulatory Visit: Payer: Self-pay | Admitting: Obstetrics & Gynecology

## 2019-04-14 MED ORDER — FLOVENT HFA 44 MCG/ACT IN AERO: 2.0000 | INHALATION_SPRAY | Freq: Two times a day (BID) | RESPIRATORY_TRACT | 12 refills | Status: DC

## 2019-04-14 NOTE — Progress Notes (Signed)
qvar not covered by medicaid.  Rx for Flovent sent.

## 2019-04-15 ENCOUNTER — Telehealth: Payer: Self-pay | Admitting: *Deleted

## 2019-04-15 NOTE — Telephone Encounter (Signed)
-----   Message from Guss Bunde, MD sent at 04/14/2019  4:52 PM EDT ----- Labs normal for pregnancy

## 2019-04-15 NOTE — Telephone Encounter (Signed)
I called Essynce and notified her of labs normal for pregnancy per Dr.Leggett. She voices understanding. Linda,RN

## 2019-04-16 ENCOUNTER — Ambulatory Visit (HOSPITAL_COMMUNITY): Payer: Medicaid Other

## 2019-04-17 ENCOUNTER — Telehealth: Payer: Self-pay | Admitting: Women's Health

## 2019-04-17 NOTE — Telephone Encounter (Signed)
Spoke to patient about her appointment on 10/1 @ 10:15. Patient instructed to wear a face mask for the entire appointment and no visitors are allowed with her during the visit. Patient screened for covid symptoms and denied having any

## 2019-04-18 ENCOUNTER — Ambulatory Visit: Payer: Self-pay

## 2019-04-18 ENCOUNTER — Other Ambulatory Visit: Payer: Self-pay

## 2019-04-18 ENCOUNTER — Encounter: Payer: Self-pay | Admitting: Women's Health

## 2019-04-18 ENCOUNTER — Ambulatory Visit (INDEPENDENT_AMBULATORY_CARE_PROVIDER_SITE_OTHER): Payer: Medicaid Other | Admitting: Women's Health

## 2019-04-18 ENCOUNTER — Ambulatory Visit (INDEPENDENT_AMBULATORY_CARE_PROVIDER_SITE_OTHER): Payer: Medicaid Other | Admitting: *Deleted

## 2019-04-18 VITALS — BP 109/65 | HR 96 | Temp 98.4°F | Wt 238.5 lb

## 2019-04-18 DIAGNOSIS — O409XX0 Polyhydramnios, unspecified trimester, not applicable or unspecified: Secondary | ICD-10-CM | POA: Insufficient documentation

## 2019-04-18 DIAGNOSIS — Z789 Other specified health status: Secondary | ICD-10-CM | POA: Insufficient documentation

## 2019-04-18 DIAGNOSIS — O24414 Gestational diabetes mellitus in pregnancy, insulin controlled: Secondary | ICD-10-CM

## 2019-04-18 DIAGNOSIS — Z23 Encounter for immunization: Secondary | ICD-10-CM | POA: Diagnosis not present

## 2019-04-18 DIAGNOSIS — O09293 Supervision of pregnancy with other poor reproductive or obstetric history, third trimester: Secondary | ICD-10-CM

## 2019-04-18 DIAGNOSIS — O403XX0 Polyhydramnios, third trimester, not applicable or unspecified: Secondary | ICD-10-CM

## 2019-04-18 DIAGNOSIS — Z8759 Personal history of other complications of pregnancy, childbirth and the puerperium: Secondary | ICD-10-CM

## 2019-04-18 DIAGNOSIS — Z3A34 34 weeks gestation of pregnancy: Secondary | ICD-10-CM

## 2019-04-18 DIAGNOSIS — O365930 Maternal care for other known or suspected poor fetal growth, third trimester, not applicable or unspecified: Secondary | ICD-10-CM

## 2019-04-18 DIAGNOSIS — O0993 Supervision of high risk pregnancy, unspecified, third trimester: Secondary | ICD-10-CM

## 2019-04-18 DIAGNOSIS — O0990 Supervision of high risk pregnancy, unspecified, unspecified trimester: Secondary | ICD-10-CM

## 2019-04-18 DIAGNOSIS — O365990 Maternal care for other known or suspected poor fetal growth, unspecified trimester, not applicable or unspecified: Secondary | ICD-10-CM

## 2019-04-18 DIAGNOSIS — J4520 Mild intermittent asthma, uncomplicated: Secondary | ICD-10-CM

## 2019-04-18 HISTORY — DX: Maternal care for other known or suspected poor fetal growth, unspecified trimester, not applicable or unspecified: O36.5990

## 2019-04-18 MED ORDER — INSULIN SYRINGES (DISPOSABLE) U-100 0.5 ML MISC: 1.0000 | Freq: Two times a day (BID) | 1 refills | Status: DC

## 2019-04-18 MED ORDER — INSULIN NPH (HUMAN) (ISOPHANE) 100 UNIT/ML ~~LOC~~ SUSP: SUBCUTANEOUS | 0 refills | Status: DC

## 2019-04-18 MED ORDER — INSULIN NPH (HUMAN) (ISOPHANE) 100 UNIT/ML ~~LOC~~ SUSP: SUBCUTANEOUS | 3 refills | Status: DC

## 2019-04-18 MED ORDER — INSULIN NPH (HUMAN) (ISOPHANE) 100 UNIT/ML ~~LOC~~ SUSP: 20.0000 [IU] | Freq: Two times a day (BID) | SUBCUTANEOUS | 0 refills | Status: DC

## 2019-04-18 NOTE — Progress Notes (Signed)
Subjective:  Joanna [name] is a 28 y.o. [phone] at [redacted]w[redacted]d being seen today for ongoing prenatal care.  She is currently monitored for the following issues for this high-risk pregnancy and has TOBACCO ABUSE; Mild intermittent asthma; Eczema; Maternal obesity affecting pregnancy, antepartum; Supervision of high risk pregnancy, antepartum; History of IUFD; Rubella non-immune status, antepartum; GBS bacteriuria; Fetal growth retardation, antenatal; GDM (gestational diabetes mellitus); Transient hypertension of pregnancy; and Polyhydramnios affecting pregnancy on their problem list.  Patient reports no complaints.  Contractions: Not present. Vag. Bleeding: None.  Movement: Present. Denies leaking of fluid.   The following portions of the patient's history were reviewed and updated as appropriate: allergies, current medications, past family history, past medical history, past social history, past surgical history and problem list. Problem list updated.  Objective:   Vitals:   04/18/19 1050  BP: 109/65  Pulse: 96  Temp: 98.4 F (36.9 C)  Weight: 238 lb 8 oz (108.2 kg)    Fetal Status: Fetal Heart Rate (bpm): 150   Movement: Present     General:  Alert, oriented and cooperative. Patient is in no acute distress.  Skin: Skin is warm and dry. No rash noted.   Cardiovascular: Normal heart rate noted  Respiratory: Normal respiratory effort, no problems with respiration noted  Abdomen: Soft, gravid, appropriate for gestational age. Pain/Pressure: Present     Pelvic: Vag. Bleeding: None     Cervical exam deferred        Extremities: Normal range of motion.  Edema: Trace  Mental Status: Normal mood and affect. Normal behavior. Normal judgment and thought content.   Urinalysis:      Assessment and Plan:  Pregnancy: [id number] at [redacted]w[redacted]d  1. Supervision of high risk pregnancy, antepartum - per Dr. Earlene Plater, pt to be seen weekly by MD until delivery - Tdap vaccine greater than or equal to 7yo IM -  information on contraception given, pt still undecided. Pt to review and discuss at next visit - discussed options for circumcision, information given, pt would like circumcision for baby  2. Poor fetal growth affecting management of mother in third trimester, single or unspecified fetus - IUGR resolved per MFM  3. Polyhydramnios affecting pregnancy  4. Insulin controlled gestational diabetes mellitus (GDM) in third trimester -pt has 17 recorded blood sugars with 12 elevated. Fasting BS 118-159, highest postprandial 236, see RN notes in chart for full list. - consulted with Dr. Earlene Plater and [name] RD and patient to start insulin NPH 20u BID. Pt instructed that RX was sent to pharmacy, but she is NOT to start taking until after education appointment tomorrow morning. Pt is to bring supplies with her to appointment for instructions on how to use. - Ambulatory referral to Nutrition and Diabetic Education - pt to continue baby ASA daily, reports is currently taking - per MFM, weekly BPP/NST until delivery, next appt scheduled 04/24/2019  5. Mild intermittent asthma, unspecified whether complicated - pt states she has not yet picked up the steroids she was prescribed on 04/14/2019 from the pharmacy, pt encouraged to head immediately to pharmacy after leaving office and begin meds as prescribed - pt states no difficulty breathing, no wheezing, no coughing today  Preterm labor symptoms and general obstetric precautions including but not limited to vaginal bleeding, contractions, leaking of fluid and fetal movement were reviewed in detail with the patient. Please refer to After Visit Summary for other counseling recommendations.  Return in 6 days (on 04/24/2019) for 10/7 as scheduled **Needs appt with  [facility] for insulin counseling ASAP.   Arley Garant, Gerrie Nordmann, NP

## 2019-04-18 NOTE — Patient Instructions (Addendum)
Contraception Choices Contraception, also called birth control, refers to methods or devices that prevent pregnancy. Hormonal methods Contraceptive implant  A contraceptive implant is a thin, plastic tube that contains a hormone. It is inserted into the upper part of the arm. It can remain in place for up to 3 years. Progestin-only injections Progestin-only injections are injections of progestin, a synthetic form of the hormone progesterone. They are given every 3 months by a health care provider. Birth control pills  Birth control pills are pills that contain hormones that prevent pregnancy. They must be taken once a day, preferably at the same time each day. Birth control patch  The birth control patch contains hormones that prevent pregnancy. It is placed on the skin and must be changed once a week for three weeks and removed on the fourth week. A prescription is needed to use this method of contraception. Vaginal ring  A vaginal ring contains hormones that prevent pregnancy. It is placed in the vagina for three weeks and removed on the fourth week. After that, the process is repeated with a new ring. A prescription is needed to use this method of contraception. Emergency contraceptive Emergency contraceptives prevent pregnancy after unprotected sex. They come in pill form and can be taken up to 5 days after sex. They work best the sooner they are taken after having sex. Most emergency contraceptives are available without a prescription. This method should not be used as your only form of birth control. Barrier methods Female condom  A female condom is a thin sheath that is worn over the penis during sex. Condoms keep sperm from going inside a woman's body. They can be used with a spermicide to increase their effectiveness. They should be disposed after a single use. Female condom  A female condom is a soft, loose-fitting sheath that is put into the vagina before sex. The condom keeps sperm  from going inside a woman's body. They should be disposed after a single use. Diaphragm  A diaphragm is a soft, dome-shaped barrier. It is inserted into the vagina before sex, along with a spermicide. The diaphragm blocks sperm from entering the uterus, and the spermicide kills sperm. A diaphragm should be left in the vagina for 6-8 hours after sex and removed within 24 hours. A diaphragm is prescribed and fitted by a health care provider. A diaphragm should be replaced every 1-2 years, after giving birth, after gaining more than 15 lb (6.8 kg), and after pelvic surgery. Cervical cap  A cervical cap is a round, soft latex or plastic cup that fits over the cervix. It is inserted into the vagina before sex, along with spermicide. It blocks sperm from entering the uterus. The cap should be left in place for 6-8 hours after sex and removed within 48 hours. A cervical cap must be prescribed and fitted by a health care provider. It should be replaced every 2 years. Sponge  A sponge is a soft, circular piece of polyurethane foam with spermicide on it. The sponge helps block sperm from entering the uterus, and the spermicide kills sperm. To use it, you make it wet and then insert it into the vagina. It should be inserted before sex, left in for at least 6 hours after sex, and removed and thrown away within 30 hours. Spermicides Spermicides are chemicals that kill or block sperm from entering the cervix and uterus. They can come as a cream, jelly, suppository, foam, or tablet. A spermicide should be inserted into the   vagina with an applicator at least 10-15 minutes before sex to allow time for it to work. The process must be repeated every time you have sex. Spermicides do not require a prescription. Intrauterine contraception Intrauterine device (IUD) An IUD is a T-shaped device that is put in a woman's uterus. There are two types:  Hormone IUD.This type contains progestin, a synthetic form of the hormone  progesterone. This type can stay in place for 3-5 years.  Copper IUD.This type is wrapped in copper wire. It can stay in place for 10 years.  Permanent methods of contraception Female tubal ligation In this method, a woman's fallopian tubes are sealed, tied, or blocked during surgery to prevent eggs from traveling to the uterus. Hysteroscopic sterilization In this method, a small, flexible insert is placed into each fallopian tube. The inserts cause scar tissue to form in the fallopian tubes and block them, so sperm cannot reach an egg. The procedure takes about 3 months to be effective. Another form of birth control must be used during those 3 months. Female sterilization This is a procedure to tie off the tubes that carry sperm (vasectomy). After the procedure, the man can still ejaculate fluid (semen). Natural planning methods Natural family planning In this method, a couple does not have sex on days when the woman could become pregnant. Calendar method This means keeping track of the length of each menstrual cycle, identifying the days when pregnancy can happen, and not having sex on those days. Ovulation method In this method, a couple avoids sex during ovulation. Symptothermal method This method involves not having sex during ovulation. The woman typically checks for ovulation by watching changes in her temperature and in the consistency of cervical mucus. Post-ovulation method In this method, a couple waits to have sex until after ovulation. Summary  Contraception, also called birth control, means methods or devices that prevent pregnancy.  Hormonal methods of contraception include implants, injections, pills, patches, vaginal rings, and emergency contraceptives.  Barrier methods of contraception can include female condoms, female condoms, diaphragms, cervical caps, sponges, and spermicides.  There are two types of IUDs (intrauterine devices). An IUD can be put in a woman's uterus to  prevent pregnancy for 3-5 years.  Permanent sterilization can be done through a procedure for males, females, or both.  Natural family planning methods involve not having sex on days when the woman could become pregnant. This information is not intended to replace advice given to you by your health care provider. Make sure you discuss any questions you have with your health care provider. Document Released: 07/04/2005 Document Revised: 07/06/2017 Document Reviewed: 08/06/2016 Elsevier Patient Education  2020 [employer]  Places to have your son circumcised:    [facility] [phone] $480 by 4 wks  [facility] [phone] $244 by 4 wks  [facility] [phone] $175 by 2 wks  [facility] [phone] $250 by 7 days [facility] [phone] $150 by 4 wks  These prices sometimes change but are roughly what you can expect to pay. Please call and confirm pricing.   Circumcision is considered an elective/non-medically necessary procedure. There are many reasons parents decide to have their sons circumsized. During the first year of life circumcised males have a reduced risk of urinary tract infections but after this year the rates between circumcised males and uncircumcised males are the same.  It is safe to have your son circumcised outside of the hospital and the places above perform them regularly.  Preeclampsia and Eclampsia Preeclampsia is a serious  condition that may develop during pregnancy. This condition causes high blood pressure and increased protein in your urine along with other symptoms, such as headaches and vision changes. These symptoms may develop as the condition gets worse. Preeclampsia may occur at 20 weeks of pregnancy or later. Diagnosing and treating preeclampsia early is very important. If not treated early, it can  cause serious problems for you and your baby. One problem it can lead to is eclampsia. Eclampsia is a condition that causes muscle jerking or shaking (convulsions or seizures) and other serious problems for the mother. During pregnancy, delivering your baby may be the best treatment for preeclampsia or eclampsia. For most women, preeclampsia and eclampsia symptoms go away after giving birth. In rare cases, a woman may develop preeclampsia after giving birth (postpartum preeclampsia). This usually occurs within 48 hours after childbirth but may occur up to 6 weeks after giving birth. What are the causes? The cause of preeclampsia is not known. What increases the risk? The following risk factors make you more likely to develop preeclampsia:  Being pregnant for the first time.  Having had preeclampsia during a past pregnancy.  Having a family history of preeclampsia.  Having high blood pressure.  Being pregnant with more than one baby.  Being 63 or older.  Being African-American.  Having kidney disease or diabetes.  Having medical conditions such as lupus or blood diseases.  Being very overweight (obese). What are the signs or symptoms? The most common symptoms are:  Severe headaches.  Vision problems, such as blurred or double vision.  Abdominal pain, especially upper abdominal pain. Other symptoms that may develop as the condition gets worse include:  Sudden weight gain.  Sudden swelling of the hands, face, legs, and feet.  Severe nausea and vomiting.  Numbness in the face, arms, legs, and feet.  Dizziness.  Urinating less than usual.  Slurred speech.  Convulsions or seizures. How is this diagnosed? There are no screening tests for preeclampsia. Your health care provider will ask you about symptoms and check for signs of preeclampsia during your prenatal visits. You may also have tests that include:  Checking your blood pressure.  Urine tests to check for  protein. Your health care provider will check for this at every prenatal visit.  Blood tests.  Monitoring your baby's heart rate.  Ultrasound. How is this treated? You and your health care provider will determine the treatment approach that is best for you. Treatment may include:  Having more frequent prenatal exams to check for signs of preeclampsia, if you have an increased risk for preeclampsia.  Medicine to lower your blood pressure.  Staying in the hospital, if your condition is severe. There, treatment will focus on controlling your blood pressure and the amount of fluids in your body (fluid retention).  Taking medicine (magnesium sulfate) to prevent seizures. This may be given as an injection or through an IV.  Taking a low-dose aspirin during your pregnancy.  Delivering your baby early. You may have your labor started with medicine (induced), or you may have a cesarean delivery. Follow these instructions at home: Eating and drinking   Drink enough fluid to keep your urine pale yellow.  Avoid caffeine. Lifestyle  Do not use any products that contain nicotine or tobacco, such as cigarettes and e-cigarettes. If you need help quitting, ask your health care provider.  Do not use alcohol or drugs.  Avoid stress as much as possible. Rest and get plenty of sleep. General instructions  Take over-the-counter and prescription medicines only as told by your health care provider.  When lying down, lie on your left side. This keeps pressure off your major blood vessels.  When sitting or lying down, raise (elevate) your feet. Try putting some pillows underneath your lower legs.  Exercise regularly. Ask your health care provider what kinds of exercise are best for you.  Keep all follow-up and prenatal visits as told by your health care provider. This is important. How is this prevented? There is no known way of preventing preeclampsia or eclampsia from developing. However, to  lower your risk of complications and detect problems early:  Get regular prenatal care. Your health care provider may be able to diagnose and treat the condition early.  Maintain a healthy weight. Ask your health care provider for help managing weight gain during pregnancy.  Work with your health care provider to manage any long-term (chronic) health conditions you have, such as diabetes or kidney problems.  You may have tests of your blood pressure and kidney function after giving birth.  Your health care provider may have you take low-dose aspirin during your next pregnancy. Contact a health care provider if:  You have symptoms that your health care provider told you may require more treatment or monitoring, such as: ? Headaches. ? Nausea or vomiting. ? Abdominal pain. ? Dizziness. ? Light-headedness. Get help right away if:  You have severe: ? Abdominal pain. ? Headaches that do not get better. ? Dizziness. ? Vision problems. ? Confusion. ? Nausea or vomiting.  You have any of the following: ? A seizure. ? Sudden, rapid weight gain. ? Sudden swelling in your hands, ankles, or face. ? Trouble moving any part of your body. ? Numbness in any part of your body. ? Trouble speaking. ? Abnormal bleeding.  You faint. Summary  Preeclampsia is a serious condition that may develop during pregnancy.  This condition causes high blood pressure and increased protein in your urine along with other symptoms, such as headaches and vision changes.  Diagnosing and treating preeclampsia early is very important. If not treated early, it can cause serious problems for you and your baby.  Get help right away if you have symptoms that your health care provider told you to watch for. This information is not intended to replace advice given to you by your health care provider. Make sure you discuss any questions you have with your health care provider. Document Released: 07/01/2000 Document  Revised: 03/06/2018 Document Reviewed: 02/08/2016 Elsevier Patient Education  2020 [employer].

## 2019-04-19 ENCOUNTER — Ambulatory Visit: Payer: Medicaid Other | Admitting: *Deleted

## 2019-04-19 NOTE — Progress Notes (Signed)
Insulin Instruction  Patient was seen on 04/19/2019 for insulin instruction.  MD orders are: NPH @ 20 units in AM and 20 units in PM EDD is 05/30/2019, patient is at [redacted]W[redacted]D today She brought her insulin and syringes to the appointment as directed.  The following learning objectives were met by the patient during this visit:   Insulin Action of NPH insulins  Reviewed syringe & vial including # units per syringe and vial  Hygiene and storage  Drawing up single and mixed doses if using vials   Single dose  Rotation of Sites  Hypoglycemia- symptoms, causes, treatment choices  Record keeping and MD follow up   Also reviewed nutrition guidelines for pregnancy including recommendation for 3 Carb Choices (45 Grams) per meal and up to 2 Carbs (30 grams) per snack along with added protein at each meal and snack. She verbalized very good understanding after the review.   Patient demonstrated understanding of insulin administration by return demonstration.  Patient received the following handouts:  Insulin Instruction Handout  GDM Packet                                        Patient took 20 units of NPH this AM at our visit and knows to take next dose around 9 PM tonight as Rx'd by MD  Patient will be seen by me for follow-up as needed.She has MD appointment next Wednesday, 04/24/2019

## 2019-04-24 ENCOUNTER — Other Ambulatory Visit: Payer: Self-pay

## 2019-04-24 ENCOUNTER — Ambulatory Visit: Payer: Self-pay

## 2019-04-24 ENCOUNTER — Ambulatory Visit (INDEPENDENT_AMBULATORY_CARE_PROVIDER_SITE_OTHER): Payer: Medicaid Other | Admitting: General Practice

## 2019-04-24 ENCOUNTER — Ambulatory Visit (INDEPENDENT_AMBULATORY_CARE_PROVIDER_SITE_OTHER): Payer: Medicaid Other | Admitting: Family Medicine

## 2019-04-24 VITALS — BP 109/72 | HR 99 | Wt 237.0 lb

## 2019-04-24 DIAGNOSIS — O24410 Gestational diabetes mellitus in pregnancy, diet controlled: Secondary | ICD-10-CM

## 2019-04-24 DIAGNOSIS — O0990 Supervision of high risk pregnancy, unspecified, unspecified trimester: Secondary | ICD-10-CM

## 2019-04-24 DIAGNOSIS — O409XX0 Polyhydramnios, unspecified trimester, not applicable or unspecified: Secondary | ICD-10-CM

## 2019-04-24 DIAGNOSIS — Z3A34 34 weeks gestation of pregnancy: Secondary | ICD-10-CM | POA: Diagnosis not present

## 2019-04-24 DIAGNOSIS — O365990 Maternal care for other known or suspected poor fetal growth, unspecified trimester, not applicable or unspecified: Secondary | ICD-10-CM

## 2019-04-24 DIAGNOSIS — O09293 Supervision of pregnancy with other poor reproductive or obstetric history, third trimester: Secondary | ICD-10-CM

## 2019-04-24 DIAGNOSIS — O0993 Supervision of high risk pregnancy, unspecified, third trimester: Secondary | ICD-10-CM

## 2019-04-24 DIAGNOSIS — Z8759 Personal history of other complications of pregnancy, childbirth and the puerperium: Secondary | ICD-10-CM

## 2019-04-24 DIAGNOSIS — O403XX0 Polyhydramnios, third trimester, not applicable or unspecified: Secondary | ICD-10-CM | POA: Diagnosis not present

## 2019-04-24 DIAGNOSIS — O365931 Maternal care for other known or suspected poor fetal growth, third trimester, fetus 1: Secondary | ICD-10-CM | POA: Diagnosis not present

## 2019-04-24 DIAGNOSIS — O133 Gestational [pregnancy-induced] hypertension without significant proteinuria, third trimester: Secondary | ICD-10-CM

## 2019-04-24 MED ORDER — INSULIN NPH (HUMAN) (ISOPHANE) 100 UNIT/ML ~~LOC~~ SUSP: SUBCUTANEOUS | 3 refills | Status: DC

## 2019-04-24 NOTE — Progress Notes (Signed)
Pt informed that the ultrasound is considered a limited OB ultrasound and is not intended to be a complete ultrasound exam.  Patient also informed that the ultrasound is not being completed with the intent of assessing for fetal or placental anomalies or any pelvic abnormalities.  Explained that the purpose of today's ultrasound is to assess for  BPP, presentation and AFI.  Patient acknowledges the purpose of the exam and the limitations of the study.    Tenika Keeran H RN BSN 04/24/19  

## 2019-04-24 NOTE — Patient Instructions (Signed)

## 2019-04-24 NOTE — Progress Notes (Signed)
   PRENATAL VISIT NOTE  Subjective:  Joanna [name] is a 28 y.o. [id number] at [redacted]w[redacted]d being seen today for ongoing prenatal care.  She is currently monitored for the following issues for this high-risk pregnancy and has TOBACCO ABUSE; Mild intermittent asthma; Eczema; Maternal obesity affecting pregnancy, antepartum; Supervision of high risk pregnancy, antepartum; History of IUFD; Rubella non-immune status, antepartum; GBS bacteriuria; Fetal growth retardation, antenatal; GDM (gestational diabetes mellitus); Transient hypertension of pregnancy; and Polyhydramnios affecting pregnancy on their problem list.  Patient reports no complaints.  Contractions: Not present. Vag. Bleeding: None.  Movement: Present. Denies leaking of fluid.   The following portions of the patient's history were reviewed and updated as appropriate: allergies, current medications, past family history, past medical history, past social history, past surgical history and problem list.   Objective:   Vitals:   04/24/19 1028  BP: 109/72  Pulse: 99  Weight: 237 lb (107.5 kg)    Fetal Status: Fetal Heart Rate (bpm): 147 Fundal Height: 33 cm Movement: Present     General:  Alert, oriented and cooperative. Patient is in no acute distress.  Skin: Skin is warm and dry. No rash noted.   Cardiovascular: Normal heart rate noted  Respiratory: Normal respiratory effort, no problems with respiration noted  Abdomen: Soft, gravid, appropriate for gestational age.  Pain/Pressure: Absent     Pelvic: Cervical exam deferred        Extremities: Normal range of motion.  Edema: None  Mental Status: Normal mood and affect. Normal behavior. Normal judgment and thought content.   Assessment and Plan:  Pregnancy: [id number] at [redacted]w[redacted]d 1. Transient hypertension of pregnancy in third trimester BP is well controlled today  2. Diet controlled gestational diabetes mellitus (GDM) in third trimester See Babyscripts log--increased NPH to 22 q am and 24 q  pm - insulin NPH Human (HUMULIN N) 100 UNIT/ML injection; Inject 22 units in the morning and 24 units in the evening  Dispense: 10 mL; Refill: 3  3. History of IUFD In testing. NST:  Baseline: 140 bpm, Variability: Good {> 6 bpm), Accelerations: Reactive and Decelerations: Absent   4. Polyhydramnios affecting pregnancy Likely due to GDM  5. Supervision of high risk pregnancy, antepartum   Preterm labor symptoms and general obstetric precautions including but not limited to vaginal bleeding, contractions, leaking of fluid and fetal movement were reviewed in detail with the patient. Please refer to After Visit Summary for other counseling recommendations.   Return in 1 week (on 05/01/2019) for [facility], in person--2 wks with HR and BPP.  Future Appointments  Date Time Provider [place]  05/01/2019  7:45 AM [place] MFC-US  05/01/2019  7:45 AM [place] 2 [facility]-MFCUS MFC-US  05/01/2019  9:15 AM WOC-WOCA NST WOC-WOCA WOC  05/01/2019 10:15 AM Constant, Vickii Chafe, MD WOC-WOCA WOC  05/08/2019  9:15 AM WOC-WOCA NST WOC-WOCA WOC  05/08/2019 10:15 AM Wood[place] Mode, MD WOC-WOCA WOC  05/15/2019  9:15 AM WOC-WOCA NST WOC-WOCA WOC  05/15/2019 10:15 AM Chancy Milroy, MD WOC-WOCA WOC  05/22/2019  9:15 AM WOC-WOCA NST WOC-WOCA WOC  05/22/2019 10:15 AM Donnamae Jude, MD [place]  05/29/2019  9:15 AM WOC-WOCA NST WOC-WOCA WOC  05/29/2019 10:15 AM Anyanwu, Sallyanne Havers, MD WOC-WOCA WOC    Donnamae Jude, MD

## 2019-04-25 ENCOUNTER — Other Ambulatory Visit: Payer: Medicaid Other

## 2019-04-27 ENCOUNTER — Encounter (HOSPITAL_COMMUNITY): Payer: Self-pay | Admitting: Student

## 2019-04-27 ENCOUNTER — Inpatient Hospital Stay (HOSPITAL_COMMUNITY)
Admission: AD | Admit: 2019-04-27 | Discharge: 2019-04-27 | Disposition: A | Discharge status: Home / Self Care | Payer: Medicaid Other | Attending: Obstetrics and Gynecology | Admitting: Obstetrics and Gynecology

## 2019-04-27 ENCOUNTER — Inpatient Hospital Stay (HOSPITAL_BASED_OUTPATIENT_CLINIC_OR_DEPARTMENT_OTHER): Payer: Medicaid Other

## 2019-04-27 DIAGNOSIS — F17210 Nicotine dependence, cigarettes, uncomplicated: Secondary | ICD-10-CM | POA: Diagnosis not present

## 2019-04-27 DIAGNOSIS — O479 False labor, unspecified: Secondary | ICD-10-CM

## 2019-04-27 DIAGNOSIS — O99213 Obesity complicating pregnancy, third trimester: Secondary | ICD-10-CM | POA: Diagnosis not present

## 2019-04-27 DIAGNOSIS — Z7982 Long term (current) use of aspirin: Secondary | ICD-10-CM | POA: Diagnosis not present

## 2019-04-27 DIAGNOSIS — O289 Unspecified abnormal findings on antenatal screening of mother: Secondary | ICD-10-CM

## 2019-04-27 DIAGNOSIS — O09293 Supervision of pregnancy with other poor reproductive or obstetric history, third trimester: Secondary | ICD-10-CM

## 2019-04-27 DIAGNOSIS — O99333 Smoking (tobacco) complicating pregnancy, third trimester: Secondary | ICD-10-CM | POA: Diagnosis not present

## 2019-04-27 DIAGNOSIS — Z3A35 35 weeks gestation of pregnancy: Secondary | ICD-10-CM

## 2019-04-27 DIAGNOSIS — O24414 Gestational diabetes mellitus in pregnancy, insulin controlled: Secondary | ICD-10-CM

## 2019-04-27 DIAGNOSIS — O365931 Maternal care for other known or suspected poor fetal growth, third trimester, fetus 1: Secondary | ICD-10-CM | POA: Diagnosis not present

## 2019-04-27 DIAGNOSIS — O288 Other abnormal findings on antenatal screening of mother: Secondary | ICD-10-CM

## 2019-04-27 DIAGNOSIS — O4703 False labor before 37 completed weeks of gestation, third trimester: Secondary | ICD-10-CM | POA: Diagnosis not present

## 2019-04-27 HISTORY — DX: Type 2 diabetes mellitus without complications: E11.9

## 2019-04-27 LAB — URINALYSIS, MICROSCOPIC (REFLEX)

## 2019-04-27 LAB — URINALYSIS, ROUTINE W REFLEX MICROSCOPIC
Bilirubin Urine: NEGATIVE
Glucose, UA: NEGATIVE mg/dL
Hgb urine dipstick: NEGATIVE
Ketones, ur: NEGATIVE mg/dL
Nitrite: NEGATIVE
Protein, ur: NEGATIVE mg/dL
Specific Gravity, Urine: 1.025 (ref 1.005–1.030)
pH: 6.5 (ref 5.0–8.0)

## 2019-04-27 NOTE — MAU Note (Signed)
PT SAYS FEELS UC'S - STARTED AT 0140.  P[facility] AT [facility].  LAST SEX- 3 MTHS.

## 2019-04-27 NOTE — MAU Provider Note (Signed)
Chief Complaint:  Contractions   First Provider Initiated Contact with Patient 04/27/19 [phone]     HPI: Joanna [name] is a 28 y.o. [id number] at [redacted]w[redacted]d who presents to maternity admissions reporting contractions. Started at 1 am this morning. Was initially every 2-3 minutes but spaced out during her ride to the hospital.  Denies vaginal bleeding or LOF. Good fetal movement.   Location: abdomen Quality: contractions Severity: 6/10 in pain scale Duration: 3 hours Timing: intermittent Modifying factors: none Associated signs and symptoms: none    Past Medical History:  Diagnosis Date  . Asthma    inhaler used 3 months  . Diabetes mellitus without complication (HCC)   . 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 IUGR (intrauterine growth restriction) affecting care of mother 04/18/2019  . Transient hypertension of pregnancy 04/08/2019  . Urinary tract infection    Pyelonephritis during pregnancy   OB History  Gravida Para Term Preterm AB Living  6 5 4 1  0 4  SAB TAB Ectopic Multiple Live Births  0 0 0 0 4    # Outcome Date GA Lbr Len/2nd Weight Sex Delivery Anes PTL Lv  6 Current           5 Preterm 2018 [redacted]w[redacted]d   M Vag-Spont   FD  4 Term 11/05/14 [redacted]w[redacted]d 13:31 / 00:01 3205 g F Vag-Spont EPI  LIV  3 Term 05/04/11 [redacted]w[redacted]d 05:12 / 00:47 3130 g M Vag-Spont EPI  LIV  2 Term 08/21/09 [redacted]w[redacted]d  3487 g F Vag-Spont EPI N LIV     Birth Comments: Induction for post-dates   1 Term 07/28/08 [redacted]w[redacted]d   F Vag-Spont EPI N LIV   Past Surgical History:  Procedure Laterality Date  . HERNIA REPAIR     Family History  Problem Relation Age of Onset  . Kidney disease Mother   . Alcohol abuse Neg Hx   . Arthritis Neg Hx   . Asthma Neg Hx   . Birth defects Neg Hx   . Cancer Neg Hx   . COPD Neg Hx   . Depression Neg Hx   . Drug abuse Neg Hx   . Diabetes Neg Hx   . Early death Neg Hx   . Hearing loss Neg Hx   . Heart disease Neg Hx   . Hyperlipidemia Neg Hx   . Hypertension Neg Hx   . Learning disabilities Neg Hx   .  Mental illness Neg Hx   . Mental retardation Neg Hx   . Miscarriages / Stillbirths Neg Hx   . Stroke Neg Hx   . Vision loss Neg Hx   . Varicose Veins Neg Hx    Social History   Tobacco Use  . Smoking status: Current Every Day Smoker    Packs/day: 0.25    Years: 2.00    Pack years: 0.50    Types: Cigarettes  . Smokeless tobacco: Never Used  Substance Use Topics  . Alcohol use: No  . Drug use: No   No Known Allergies No medications prior to admission.    I have reviewed patient's Past Medical Hx, Surgical Hx, Family Hx, Social Hx, medications and allergies.   ROS:  Review of Systems  Constitutional: Negative.   Gastrointestinal: Positive for abdominal pain. Negative for constipation, diarrhea, nausea and vomiting.  Genitourinary: Negative.   Musculoskeletal: Positive for back pain.    Physical Exam   Patient Vitals for the past 24 hrs:  BP Temp Pulse Resp Height Weight  04/27/19 0617  125/64 - 93 20 - -  04/27/19 0354 114/60 98.4 F (36.9 C) 97 20 5\' 4"  (1.626 m) 109.1 kg    Constitutional: Well-developed, well-nourished female in no acute distress.  Cardiovascular: normal rate & rhythm, no murmur Respiratory: normal effort, lung sounds clear throughout GI: Abd soft, non-tender, gravid appropriate for gestational age. Pos BS x 4 MS: Extremities nontender, no edema, normal ROM Neurologic: Alert and oriented x 4.  GU:   Dilation: 1 Effacement (%): Thick Cervical Position: Posterior Station: Ballotable Presentation: Vertex Exam by:: Jorje Guild, NP  NST:  Baseline: 140 bpm, Variability: Good {> 6 bpm), Accelerations: 10x10 and Decelerations: Absent   Labs: Results for orders placed or performed during the hospital encounter of 04/27/19 (from the past 24 hour(s))  Urinalysis, Routine w reflex microscopic     Status: Abnormal   Collection Time: 04/27/19  4:05 AM  Result Value Ref Range   Color, Urine YELLOW YELLOW   APPearance CLEAR CLEAR   Specific Gravity,  Urine 1.025 1.005 - 1.030   pH 6.5 5.0 - 8.0   Glucose, UA NEGATIVE NEGATIVE mg/dL   Hgb urine dipstick NEGATIVE NEGATIVE   Bilirubin Urine NEGATIVE NEGATIVE   Ketones, ur NEGATIVE NEGATIVE mg/dL   Protein, ur NEGATIVE NEGATIVE mg/dL   Nitrite NEGATIVE NEGATIVE   Leukocytes,Ua SMALL (A) NEGATIVE  Urinalysis, Microscopic (reflex)     Status: Abnormal   Collection Time: 04/27/19  4:05 AM  Result Value Ref Range   RBC / HPF 0-5 0 - 5 RBC/hpf   WBC, UA 6-10 0 - 5 WBC/hpf   Bacteria, UA FEW (A) NONE SEEN   Squamous Epithelial / LPF 0-5 0 - 5   Urine-Other LESS THAN 10 mL OF URINE SUBMITTED     Imaging:  No results found.  MAU Course: Orders Placed This Encounter  Procedures  . Culture, OB Urine  . [place] MFM FETAL BPP WO NON STRESS  . Urinalysis, Routine w reflex microscopic  . Urinalysis, Microscopic (reflex)  . Discharge patient   No orders of the defined types were placed in this encounter.   MDM: No regular contractions on monitor & cervix unchanged while in MAU U/a with some leuks. No urinary symptoms. Urine culture sent Fetal tracing category 1, 15x15 accel during initial tracing. BPP 8/8 with normal AFI. Fetal tracing compared to last NST in office & comparable.   Assessment: 1. False labor   2. Non-reactive NST (non-stress test)   3. [redacted] weeks gestation of pregnancy     Plan: Discharge home in stable condition.  Labor precautions and fetal kick counts Urine culture pending Follow-up Information    [facility] Follow up.   Specialty: Obstetrics and Gynecology Why: return for worsening symptoms Contact information: [address] [id number] [place] [phone]          Allergies as of 04/27/2019   No Known Allergies     Medication List    TAKE these medications   Accu-Chek Aviva Plus test strip Generic drug: glucose blood Use as instructed QID   Accu-Chek FastClix Lancets Misc 1 Device by  Percutaneous route 4 (four) times daily.   albuterol 108 (90 Base) MCG/ACT inhaler Commonly known as: VENTOLIN HFA Inhale 2 puffs into the lungs every 4 (four) hours as needed for wheezing or shortness of breath.   AMBULATORY NON FORMULARY MEDICATION 1 Device by Other route once a week. Blood pressure cuff/medium/monitored regularly at home/ICD 10: HROB O09.90  aspirin EC 81 MG tablet Take 1 tablet (81 mg total) by mouth daily. Take after 12 weeks for prevention of preeclampsia later in pregnancy   ferrous sulfate 325 (65 FE) MG tablet Take 1 tablet (325 mg total) by mouth 2 (two) times daily with a meal.   Flovent HFA 44 MCG/ACT inhaler Generic drug: fluticasone Inhale 2 puffs into the lungs 2 (two) times daily.   insulin NPH Human 100 UNIT/ML injection Commonly known as: HumuLIN N Inject 22 units in the morning and 24 units in the evening   Insulin Syringes (Disposable) U-100 0.5 ML Misc 1 each by Does not apply route 2 (two) times daily with a meal.   Prenatal Vitamin 27-0.8 MG Tabs Take 1 tablet by mouth daily.   Qvar RediHaler 40 MCG/ACT inhaler Generic drug: beclomethasone Inhale 1 puff into the lungs 2 (two) times daily.       Judeth HornLawrence, Cecily Lawhorne, NP 04/27/2019 8:59 AM

## 2019-04-27 NOTE — Discharge Instructions (Signed)
Fetal Movement Counts Patient Name: ________________________________________________ Patient Due Date: ____________________ What is a fetal movement count?  A fetal movement count is the number of times that you feel your baby move during a certain amount of time. This may also be called a fetal kick count. A fetal movement count is recommended for every pregnant woman. You may be asked to start counting fetal movements as early as week 28 of your pregnancy. Pay attention to when your baby is most active. You may notice your baby's sleep and wake cycles. You may also notice things that make your baby move more. You should do a fetal movement count:  When your baby is normally most active.  At the same time each day. A good time to count movements is while you are resting, after having something to eat and drink. How do I count fetal movements? 1. Find a quiet, comfortable area. Sit, or lie down on your side. 2. Write down the date, the start time and stop time, and the number of movements that you felt between those two times. Take this information with you to your health care visits. 3. For 2 hours, count kicks, flutters, swishes, rolls, and jabs. You should feel at least 10 movements during 2 hours. 4. You may stop counting after you have felt 10 movements. 5. If you do not feel 10 movements in 2 hours, have something to eat and drink. Then, keep resting and counting for 1 hour. If you feel at least 4 movements during that hour, you may stop counting. Contact a health care provider if:  You feel fewer than 4 movements in 2 hours.  Your baby is not moving like he or she usually does. Date: ____________ Start time: ____________ Stop time: ____________ Movements: ____________ Date: ____________ Start time: ____________ Stop time: ____________ Movements: ____________ Date: ____________ Start time: ____________ Stop time: ____________ Movements: ____________ Date: ____________ Start time:  ____________ Stop time: ____________ Movements: ____________ Date: ____________ Start time: ____________ Stop time: ____________ Movements: ____________ Date: ____________ Start time: ____________ Stop time: ____________ Movements: ____________ Date: ____________ Start time: ____________ Stop time: ____________ Movements: ____________ Date: ____________ Start time: ____________ Stop time: ____________ Movements: ____________ Date: ____________ Start time: ____________ Stop time: ____________ Movements: ____________ This information is not intended to replace advice given to you by your health care provider. Make sure you discuss any questions you have with your health care provider. Document Released: 08/03/2006 Document Revised: 07/24/2018 Document Reviewed: 08/13/2015 Elsevier Patient Education  2020 [employer] Braxton Hicks Contractions Contractions of the uterus can occur throughout pregnancy, but they are not always a sign that you are in labor. You may have practice contractions called Braxton Hicks contractions. These false labor contractions are sometimes confused with true labor. What are Braxton Hicks contractions? Braxton Hicks contractions are tightening movements that occur in the muscles of the uterus before labor. Unlike true labor contractions, these contractions do not result in opening (dilation) and thinning of the cervix. Toward the end of pregnancy (32-34 weeks), Braxton Hicks contractions can happen more often and may become stronger. These contractions are sometimes difficult to tell apart from true labor because they can be very uncomfortable. You should not feel embarrassed if you go to the hospital with false labor. Sometimes, the only way to tell if you are in true labor is for your health care provider to look for changes in the cervix. The health care provider will do a physical exam and may monitor your contractions. If you   are not in true labor, the exam should show  that your cervix is not dilating and your water has not broken. If there are no other health problems associated with your pregnancy, it is completely safe for you to be sent home with false labor. You may continue to have Braxton Hicks contractions until you go into true labor. How to tell the difference between true labor and false labor True labor  Contractions last 30-70 seconds.  Contractions become very regular.  Discomfort is usually felt in the top of the uterus, and it spreads to the lower abdomen and low back.  Contractions do not go away with walking.  Contractions usually become more intense and increase in frequency.  The cervix dilates and gets thinner. False labor  Contractions are usually shorter and not as strong as true labor contractions.  Contractions are usually irregular.  Contractions are often felt in the front of the lower abdomen and in the groin.  Contractions may go away when you walk around or change positions while lying down.  Contractions get weaker and are shorter-lasting as time goes on.  The cervix usually does not dilate or become thin. Follow these instructions at home:   Take over-the-counter and prescription medicines only as told by your health care provider.  Keep up with your usual exercises and follow other instructions from your health care provider.  Eat and drink lightly if you think you are going into labor.  If Braxton Hicks contractions are making you uncomfortable: ? Change your position from lying down or resting to walking, or change from walking to resting. ? Sit and rest in a tub of warm water. ? Drink enough fluid to keep your urine pale yellow. Dehydration may cause these contractions. ? Do slow and deep breathing several times an hour.  Keep all follow-up prenatal visits as told by your health care provider. This is important. Contact a health care provider if:  You have a fever.  You have continuous pain in  your abdomen. Get help right away if:  Your contractions become stronger, more regular, and closer together.  You have fluid leaking or gushing from your vagina.  You pass blood-tinged mucus (bloody show).  You have bleeding from your vagina.  You have low back pain that you never had before.  You feel your baby's head pushing down and causing pelvic pressure.  Your baby is not moving inside you as much as it used to. Summary  Contractions that occur before labor are called Braxton Hicks contractions, false labor, or practice contractions.  Braxton Hicks contractions are usually shorter, weaker, farther apart, and less regular than true labor contractions. True labor contractions usually become progressively stronger and regular, and they become more frequent.  Manage discomfort from Braxton Hicks contractions by changing position, resting in a warm bath, drinking plenty of water, or practicing deep breathing. This information is not intended to replace advice given to you by your health care provider. Make sure you discuss any questions you have with your health care provider. Document Released: 11/17/2016 Document Revised: 06/16/2017 Document Reviewed: 11/17/2016 Elsevier Patient Education  2020 Else[employer] Inc[employer]  

## 2019-04-27 NOTE — MAU Note (Signed)
Pt here for pelvic pressure and contractions that started around 0100. Reports contractions were every 2 mins, but have since spaced out. Rates 6/10. Reports good fetal movement. She denies LOF or vaginal bleeding.

## 2019-04-28 ENCOUNTER — Encounter (HOSPITAL_COMMUNITY): Payer: Self-pay

## 2019-04-28 ENCOUNTER — Inpatient Hospital Stay (HOSPITAL_COMMUNITY)
Admission: AD | Admit: 2019-04-28 | Discharge: 2019-04-28 | Discharge status: Against Medical Advice | Payer: Medicaid Other | Attending: Obstetrics & Gynecology | Admitting: Obstetrics & Gynecology

## 2019-04-28 ENCOUNTER — Other Ambulatory Visit: Payer: Self-pay

## 2019-04-28 DIAGNOSIS — O24113 Pre-existing diabetes mellitus, type 2, in pregnancy, third trimester: Secondary | ICD-10-CM | POA: Diagnosis not present

## 2019-04-28 DIAGNOSIS — O99333 Smoking (tobacco) complicating pregnancy, third trimester: Secondary | ICD-10-CM | POA: Insufficient documentation

## 2019-04-28 DIAGNOSIS — F17210 Nicotine dependence, cigarettes, uncomplicated: Secondary | ICD-10-CM | POA: Insufficient documentation

## 2019-04-28 DIAGNOSIS — N898 Other specified noninflammatory disorders of vagina: Secondary | ICD-10-CM

## 2019-04-28 DIAGNOSIS — Z794 Long term (current) use of insulin: Secondary | ICD-10-CM | POA: Insufficient documentation

## 2019-04-28 DIAGNOSIS — Z7982 Long term (current) use of aspirin: Secondary | ICD-10-CM | POA: Diagnosis not present

## 2019-04-28 DIAGNOSIS — Z79899 Other long term (current) drug therapy: Secondary | ICD-10-CM | POA: Diagnosis not present

## 2019-04-28 DIAGNOSIS — O99513 Diseases of the respiratory system complicating pregnancy, third trimester: Secondary | ICD-10-CM | POA: Diagnosis not present

## 2019-04-28 DIAGNOSIS — O26893 Other specified pregnancy related conditions, third trimester: Secondary | ICD-10-CM | POA: Diagnosis present

## 2019-04-28 DIAGNOSIS — Z7951 Long term (current) use of inhaled steroids: Secondary | ICD-10-CM | POA: Diagnosis not present

## 2019-04-28 DIAGNOSIS — J45909 Unspecified asthma, uncomplicated: Secondary | ICD-10-CM | POA: Insufficient documentation

## 2019-04-28 DIAGNOSIS — Z3A35 35 weeks gestation of pregnancy: Secondary | ICD-10-CM | POA: Insufficient documentation

## 2019-04-28 DIAGNOSIS — O99820 Streptococcus B carrier state complicating pregnancy: Secondary | ICD-10-CM | POA: Insufficient documentation

## 2019-04-28 DIAGNOSIS — R109 Unspecified abdominal pain: Secondary | ICD-10-CM | POA: Diagnosis not present

## 2019-04-28 DIAGNOSIS — E119 Type 2 diabetes mellitus without complications: Secondary | ICD-10-CM | POA: Insufficient documentation

## 2019-04-28 LAB — CULTURE, OB URINE: Culture: 50000 — AB

## 2019-04-28 LAB — URINALYSIS, ROUTINE W REFLEX MICROSCOPIC
Bilirubin Urine: NEGATIVE
Glucose, UA: NEGATIVE mg/dL
Hgb urine dipstick: NEGATIVE
Ketones, ur: 20 mg/dL — AB
Leukocytes,Ua: NEGATIVE
Nitrite: NEGATIVE
Protein, ur: NEGATIVE mg/dL
Specific Gravity, Urine: 1.011 (ref 1.005–1.030)
pH: 7 (ref 5.0–8.0)

## 2019-04-28 LAB — WET PREP, GENITAL
Clue Cells Wet Prep HPF POC: NONE SEEN
Sperm: NONE SEEN
Trich, Wet Prep: NONE SEEN
Yeast Wet Prep HPF POC: NONE SEEN

## 2019-04-28 NOTE — MAU Note (Signed)
Upon RN entry to room, pt no longer in room. EFM monitors on flood/bed. All belongings gone.

## 2019-04-28 NOTE — MAU Provider Note (Addendum)
History     CSN: [id number]  Arrival date and time: 04/28/19 4536   First Provider Initiated Contact with Patient 04/28/19 1057      Chief Complaint  Patient presents with  . Abdominal Pain  . Vaginal Discharge   HPI  Ms.  Joanna [name] is a 28 y.o. year old [id number] female at 51w3dweeks gestation who presents to MAU reporting her mucous plug came out this AM. She reports it was pink-tinged in color. She also reports some abdominal pain that is every 5-10 mins. She denies any LOF. She reports good (+) FM today.    Past Medical History:  Diagnosis Date  . Asthma    inhaler used 3 months  . [place] mellitus [place]   . 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 IUGR (intrauterine growth restriction) affecting care of mother 04/18/2019  . Transient hypertension of pregnancy 04/08/2019  . Urinary tract infection    Pyelonephritis during pregnancy    Past Surgical History:  Procedure Laterality Date  . HERNIA REPAIR      Family History  Problem Relation Age of Onset  . Kidney disease Mother   . Alcohol abuse Neg Hx   . Arthritis Neg Hx   . Asthma Neg Hx   . Birth defects Neg Hx   . Cancer Neg Hx   . COPD Neg Hx   . Depression Neg Hx   . Drug abuse Neg Hx   . Diabetes Neg Hx   . Early death Neg Hx   . Hearing loss Neg Hx   . Heart disease Neg Hx   . Hyperlipidemia Neg Hx   . Hypertension Neg Hx   . Learning disabilities Neg Hx   . Mental illness Neg Hx   . Mental retardation Neg Hx   . Miscarriages / Stillbirths Neg Hx   . Stroke Neg Hx   . Vision loss Neg Hx   . Varicose Veins Neg Hx     Social History   Tobacco Use  . Smoking status: Current Every Day Smoker    Packs/day: 0.25    Years: 2.00    Pack years: 0.50    Types: Cigarettes  . Smokeless tobacco: Never Used  Substance Use Topics  . Alcohol use: No  . Drug use: No    Allergies: No Known Allergies  Medications Prior to Admission  Medication Sig Dispense Refill Last Dose  . Accu-Chek  FastClix Lancets MISC 1 Device by Percutaneous route 4 (four) times daily. 100 each 12   . albuterol (VENTOLIN HFA) 108 (90 Base) MCG/ACT inhaler Inhale 2 puffs into the lungs every 4 (four) hours as needed for wheezing or shortness of breath. 2 Inhaler 0   . AMBULATORY NON FORMULARY MEDICATION 1 Device by Other route once a week. Blood pressure cuff/medium/monitored regularly at home/ICD 10: HROB O09.90 1 kit 0   . aspirin EC 81 MG tablet Take 1 tablet (81 mg total) by mouth daily. Take after 12 weeks for prevention of preeclampsia later in pregnancy 300 tablet 2   . beclomethasone (QVAR REDIHALER) 40 MCG/ACT inhaler Inhale 1 puff into the lungs 2 (two) times daily. 10.6 g 1   . ferrous sulfate 325 (65 FE) MG tablet Take 1 tablet (325 mg total) by mouth 2 (two) times daily with a meal. 60 tablet 1   . fluticasone (FLOVENT HFA) 44 MCG/ACT inhaler Inhale 2 puffs into the lungs 2 (two) times daily. 1 Inhaler 12   . glucose blood (  ACCU-CHEK AVIVA PLUS) test strip Use as instructed QID 100 each 12   . insulin NPH Human (HUMULIN N) 100 UNIT/ML injection Inject 22 units in the morning and 24 units in the evening 10 mL 3   . Insulin Syringes, Disposable, U-100 0.5 ML MISC 1 each by Does not apply route 2 (two) times daily with a meal. 100 each 1   . Prenatal Vit-Fe Fumarate-FA (PRENATAL VITAMIN) 27-0.8 MG TABS Take 1 tablet by mouth daily. 90 tablet 3     Review of Systems  Constitutional: Negative.   HENT: Negative.   Eyes: Negative.   Respiratory: Negative.   Cardiovascular: Negative.   Gastrointestinal: Negative.   Endocrine: Negative.   Genitourinary: Positive for vaginal bleeding (pink tinged mucous) and vaginal discharge.  Musculoskeletal: Negative.   Skin: Negative.   Allergic/Immunologic: Negative.   Neurological: Negative.   Hematological: Negative.   Psychiatric/Behavioral: Negative.    Physical Exam   Blood pressure 132/64, pulse (!) 113, temperature 98.7 F (37.1 C), temperature  source Oral, resp. rate 18, height '5\' 4"'$  (1.626 m), weight 108.6 kg, last menstrual period 08/23/2018, SpO2 96 %.  Physical Exam  Nursing note and vitals reviewed. Constitutional: She is oriented to person, place, and time. She appears well-developed and well-nourished.  HENT:  Head: Normocephalic and atraumatic.  Eyes: Pupils are equal, round, and reactive to light.  Neck: Normal range of motion.  Cardiovascular: Normal rate.  Respiratory: Effort normal.  GI: Soft.  Genitourinary:    Genitourinary Comments: Uterus: gravid, S=D, SE: cervix is smooth, pink, no lesions, moderate amt of mucousy, white vaginal d/c -- WP, GC/CT done, 1.5/thick/posterior, no CMT or friability, no adnexal tenderness    Musculoskeletal: Normal range of motion.  Neurological: She is alert and oriented to person, place, and time. She has normal reflexes.  Skin: Skin is warm and dry.  Psychiatric: She has a normal mood and affect. Her behavior is normal. Judgment and thought content normal.   NST - FHR: 145 bpm / moderate variability / accels present / decels absent / TOCO: UI noted   MAU Course  Procedures  MDM CCUA Wet Prep GC/CT -- pending   Results for orders placed or performed during the hospital encounter of 04/28/19 (from the past 24 hour(s))  Urinalysis, Routine w reflex microscopic     Status: Abnormal   Collection Time: 04/28/19 10:17 AM  Result Value Ref Range   Color, Urine YELLOW YELLOW   APPearance CLEAR CLEAR   Specific Gravity, Urine 1.011 1.005 - 1.030   pH 7.0 5.0 - 8.0   Glucose, UA NEGATIVE NEGATIVE mg/dL   Hgb urine dipstick NEGATIVE NEGATIVE   Bilirubin Urine NEGATIVE NEGATIVE   Ketones, ur 20 (A) NEGATIVE mg/dL   Protein, ur NEGATIVE NEGATIVE mg/dL   Nitrite NEGATIVE NEGATIVE   Leukocytes,Ua NEGATIVE NEGATIVE  Wet prep, genital     Status: Abnormal   Collection Time: 04/28/19 11:21 AM  Result Value Ref Range   Yeast Wet Prep HPF POC NONE SEEN NONE SEEN   Trich, Wet Prep  NONE SEEN NONE SEEN   Clue Cells Wet Prep HPF POC NONE SEEN NONE SEEN   WBC, Wet Prep HPF POC MANY (A) NONE SEEN   Sperm NONE SEEN     Assessment and Plan  Vaginal discharge during pregnancy in third trimester - When RN went to room to update patient on lab results, patient was not in the room. - Patient left AMA after being seen by MAU provider  Laury Deep, MSN, CNM 04/28/2019, 10:58 AM

## 2019-04-28 NOTE — MAU Note (Signed)
Informed provider that patient left

## 2019-04-28 NOTE — MAU Note (Signed)
Joanna [name] is a 28 y.o. at [redacted]w[redacted]d here in MAU reporting: states mucus plug came out this morning. States it has a pink tint. Having some lower abdominal pain, states pain comes every 5-10 min. No LOF. +FM  Onset of complaint: this morning  Pain score: 7/10   Vitals:   04/28/19 1006  BP: 132/64  Pulse: (!) 113  Resp: 18  Temp: 98.7 F (37.1 C)  SpO2: 96%     FHT: +FM  Lab orders placed from triage: UA

## 2019-04-29 ENCOUNTER — Other Ambulatory Visit: Payer: Self-pay | Admitting: Student

## 2019-04-29 ENCOUNTER — Other Ambulatory Visit: Payer: Self-pay | Admitting: *Deleted

## 2019-04-29 ENCOUNTER — Telehealth: Payer: Self-pay | Admitting: Medical

## 2019-04-29 DIAGNOSIS — J4520 Mild intermittent asthma, uncomplicated: Secondary | ICD-10-CM

## 2019-04-29 DIAGNOSIS — O26893 Other specified pregnancy related conditions, third trimester: Secondary | ICD-10-CM | POA: Diagnosis present

## 2019-04-29 DIAGNOSIS — N898 Other specified noninflammatory disorders of vagina: Secondary | ICD-10-CM | POA: Diagnosis present

## 2019-04-29 MED ORDER — AMOXICILLIN 875 MG PO TABS: 875.0000 mg | ORAL_TABLET | Freq: Two times a day (BID) | ORAL | 0 refills | Status: DC

## 2019-04-29 NOTE — Telephone Encounter (Signed)
Received a fax refill request for Albuterol. Will fax to provider. Linda,RN

## 2019-04-29 NOTE — Telephone Encounter (Signed)
Patient informed of +GBS in urine. Rx sent. Will also need treatment in labor. Patient aware and voiced understanding.   Luvenia Redden, PA-C 04/29/2019 12:32 PM

## 2019-04-30 ENCOUNTER — Ambulatory Visit (HOSPITAL_COMMUNITY): Payer: Medicaid Other

## 2019-04-30 ENCOUNTER — Encounter (HOSPITAL_COMMUNITY): Payer: Self-pay

## 2019-04-30 MED ORDER — ALBUTEROL SULFATE HFA 108 (90 BASE) MCG/ACT IN AERS: 2.0000 | INHALATION_SPRAY | RESPIRATORY_TRACT | 4 refills | Status: DC | PRN

## 2019-05-01 ENCOUNTER — Encounter (HOSPITAL_COMMUNITY): Payer: Self-pay | Admitting: *Deleted

## 2019-05-01 ENCOUNTER — Other Ambulatory Visit: Payer: Self-pay

## 2019-05-01 ENCOUNTER — Ambulatory Visit (INDEPENDENT_AMBULATORY_CARE_PROVIDER_SITE_OTHER): Payer: Medicaid Other | Admitting: General Practice

## 2019-05-01 ENCOUNTER — Ambulatory Visit (HOSPITAL_COMMUNITY)
Admission: RE | Admit: 2019-05-01 | Discharge: 2019-05-01 | Disposition: A | Discharge status: Home / Self Care | Payer: Medicaid Other | Source: Ambulatory Visit | Attending: Maternal & Fetal Medicine | Admitting: Maternal & Fetal Medicine

## 2019-05-01 ENCOUNTER — Ambulatory Visit (INDEPENDENT_AMBULATORY_CARE_PROVIDER_SITE_OTHER): Payer: Medicaid Other | Admitting: Obstetrics and Gynecology

## 2019-05-01 ENCOUNTER — Encounter: Payer: Self-pay | Admitting: Obstetrics and Gynecology

## 2019-05-01 ENCOUNTER — Ambulatory Visit (HOSPITAL_COMMUNITY): Payer: Medicaid Other | Admitting: *Deleted

## 2019-05-01 VITALS — BP 136/75 | HR 111 | Wt 236.0 lb

## 2019-05-01 DIAGNOSIS — O09293 Supervision of pregnancy with other poor reproductive or obstetric history, third trimester: Secondary | ICD-10-CM

## 2019-05-01 DIAGNOSIS — O24414 Gestational diabetes mellitus in pregnancy, insulin controlled: Secondary | ICD-10-CM | POA: Diagnosis not present

## 2019-05-01 DIAGNOSIS — R8271 Bacteriuria: Secondary | ICD-10-CM | POA: Insufficient documentation

## 2019-05-01 DIAGNOSIS — Z8759 Personal history of other complications of pregnancy, childbirth and the puerperium: Secondary | ICD-10-CM

## 2019-05-01 DIAGNOSIS — O0993 Supervision of high risk pregnancy, unspecified, third trimester: Secondary | ICD-10-CM

## 2019-05-01 DIAGNOSIS — O26893 Other specified pregnancy related conditions, third trimester: Secondary | ICD-10-CM | POA: Diagnosis present

## 2019-05-01 DIAGNOSIS — Z20822 Contact with and (suspected) exposure to covid-19: Secondary | ICD-10-CM

## 2019-05-01 DIAGNOSIS — Z20828 Contact with and (suspected) exposure to other viral communicable diseases: Secondary | ICD-10-CM | POA: Diagnosis not present

## 2019-05-01 DIAGNOSIS — Z3A35 35 weeks gestation of pregnancy: Secondary | ICD-10-CM | POA: Diagnosis not present

## 2019-05-01 DIAGNOSIS — O99891 Other specified diseases and conditions complicating pregnancy: Secondary | ICD-10-CM | POA: Insufficient documentation

## 2019-05-01 DIAGNOSIS — Z283 Underimmunization status: Secondary | ICD-10-CM

## 2019-05-01 DIAGNOSIS — O365990 Maternal care for other known or suspected poor fetal growth, unspecified trimester, not applicable or unspecified: Secondary | ICD-10-CM | POA: Diagnosis not present

## 2019-05-01 DIAGNOSIS — O403XX0 Polyhydramnios, third trimester, not applicable or unspecified: Secondary | ICD-10-CM | POA: Insufficient documentation

## 2019-05-01 DIAGNOSIS — N898 Other specified noninflammatory disorders of vagina: Secondary | ICD-10-CM | POA: Insufficient documentation

## 2019-05-01 DIAGNOSIS — O409XX0 Polyhydramnios, unspecified trimester, not applicable or unspecified: Secondary | ICD-10-CM

## 2019-05-01 DIAGNOSIS — O99213 Obesity complicating pregnancy, third trimester: Secondary | ICD-10-CM

## 2019-05-01 DIAGNOSIS — O99333 Smoking (tobacco) complicating pregnancy, third trimester: Secondary | ICD-10-CM

## 2019-05-01 DIAGNOSIS — O365931 Maternal care for other known or suspected poor fetal growth, third trimester, fetus 1: Secondary | ICD-10-CM

## 2019-05-01 DIAGNOSIS — Z362 Encounter for other antenatal screening follow-up: Secondary | ICD-10-CM | POA: Diagnosis not present

## 2019-05-01 DIAGNOSIS — O133 Gestational [pregnancy-induced] hypertension without significant proteinuria, third trimester: Secondary | ICD-10-CM

## 2019-05-01 DIAGNOSIS — O99893 Other specified diseases and conditions complicating puerperium: Secondary | ICD-10-CM

## 2019-05-01 DIAGNOSIS — O09899 Supervision of other high risk pregnancies, unspecified trimester: Secondary | ICD-10-CM

## 2019-05-01 DIAGNOSIS — O365930 Maternal care for other known or suspected poor fetal growth, third trimester, not applicable or unspecified: Secondary | ICD-10-CM

## 2019-05-01 DIAGNOSIS — O0990 Supervision of high risk pregnancy, unspecified, unspecified trimester: Secondary | ICD-10-CM

## 2019-05-01 NOTE — Progress Notes (Signed)
   PRENATAL VISIT NOTE  Subjective:  Joanna [name] is a 28 y.o. [phone] at [redacted]w[redacted]d being seen today for ongoing prenatal care.  She is currently monitored for the following issues for this high-risk pregnancy and has TOBACCO ABUSE; Mild intermittent asthma; Eczema; Maternal obesity affecting pregnancy, antepartum; Supervision of high risk pregnancy, antepartum; History of IUFD; Rubella non-immune status, antepartum; GBS bacteriuria; Fetal growth retardation, antenatal; GDM (gestational diabetes mellitus); Transient hypertension of pregnancy; Polyhydramnios affecting pregnancy; and Vaginal discharge during pregnancy in third trimester on their problem list.  Patient reports no complaints.  Contractions: Irritability. Vag. Bleeding: None.  Movement: Present. Denies leaking of fluid.   The following portions of the patient's history were reviewed and updated as appropriate: allergies, current medications, past family history, past medical history, past social history, past surgical history and problem list.   Objective:   Vitals:   05/01/19 0943  BP: 136/75  Pulse: (!) 111  Weight: 236 lb (107 kg)    Fetal Status: Fetal Heart Rate (bpm): NST   Movement: Present     General:  Alert, oriented and cooperative. Patient is in no acute distress.  Skin: Skin is warm and dry. No rash noted.   Cardiovascular: Normal heart rate noted  Respiratory: Normal respiratory effort, no problems with respiration noted  Abdomen: Soft, gravid, appropriate for gestational age.  Pain/Pressure: Present     Pelvic: Cervical exam deferred        Extremities: Normal range of motion.  Edema: None  Mental Status: Normal mood and affect. Normal behavior. Normal judgment and thought content.   Assessment and Plan:  Pregnancy: [id number] at [redacted]w[redacted]d 1. Insulin controlled gestational diabetes mellitus (GDM) in third trimester CBGs reviewed and elevated fasting. Patient admits to [place]. Reviewed importance of  adhering to diet. Will increase bedtime NPH 26 from 24. BPP 8/10 (non reactive NST with 10x10 accels)  2. Fetal growth retardation, antenatal   3. History of IUFD Plan for IOL at 39 weeks  4. Supervision of high risk pregnancy, antepartum Patient is doing well with a persistent dry cough. She denies fever, chills, lost of taste or exposure to covid  Advised patient to present to [facility] location for free covid testing given her current symptoms and peripartum state Cultures next visit  5. Rubella non-immune status, antepartum Will offer postpartume  Preterm labor symptoms and general obstetric precautions including but not limited to vaginal bleeding, contractions, leaking of fluid and fetal movement were reviewed in detail with the patient. Please refer to After Visit Summary for other counseling recommendations.   Return in about 1 week (around 05/08/2019) for ROB, in person, NST, GBS.  Future Appointments  Date Time Provider [place]  05/01/2019 10:15 AM Tovia Kisner, Vickii Chafe, MD [facility] WOC  05/08/2019  9:15 AM [facility]OC[facility] NST [facility] WOC  05/08/2019 10:15 AM Woodroe Mode, MD [facility] WOC  05/15/2019  9:15 AM WOC-WOCA NST [facility] WOC  05/15/2019 10:15 AM Chancy Milroy, MD [facility] WOC  05/22/2019  9:15 AM WOC-WOCA NST [facility] WOC  05/22/2019 10:15 AM Donnamae Jude, MD [facility] WOC  05/29/2019  9:15 AM WOC-WOCA NST [facility] WOC  05/29/2019 10:15 AM Anyanwu, Sallyanne Havers, MD [facility] WOC    Mora Bellman, MD

## 2019-05-01 NOTE — Progress Notes (Signed)
    Patient reports productive cough & nasal congestion for the past 3 days.

## 2019-05-01 NOTE — Progress Notes (Signed)
NST non reactive today but 8/8 BPP. Patient will see Dr Elly Modena for today's visit.  Koren Bound RN BSN 05/01/19

## 2019-05-01 NOTE — Progress Notes (Signed)
Patient seen and assessed by nursing staff during this encounter. I have reviewed the chart and agree with the documentation and plan.  Mora Bellman, MD 05/01/2019 1:11 PM

## 2019-05-03 LAB — NOVEL CORONAVIRUS, NAA: SARS-CoV-2, NAA: NOT DETECTED

## 2019-05-08 ENCOUNTER — Other Ambulatory Visit (HOSPITAL_COMMUNITY)
Admission: RE | Admit: 2019-05-08 | Discharge: 2019-05-08 | Disposition: A | Discharge status: Home / Self Care | Payer: Medicaid Other | Source: Ambulatory Visit | Attending: Obstetrics & Gynecology | Admitting: Obstetrics & Gynecology

## 2019-05-08 ENCOUNTER — Other Ambulatory Visit: Payer: Self-pay

## 2019-05-08 ENCOUNTER — Ambulatory Visit: Payer: Self-pay

## 2019-05-08 ENCOUNTER — Ambulatory Visit (INDEPENDENT_AMBULATORY_CARE_PROVIDER_SITE_OTHER): Payer: Medicaid Other | Admitting: Obstetrics & Gynecology

## 2019-05-08 ENCOUNTER — Ambulatory Visit (INDEPENDENT_AMBULATORY_CARE_PROVIDER_SITE_OTHER): Payer: Medicaid Other | Admitting: General Practice

## 2019-05-08 ENCOUNTER — Encounter: Payer: Self-pay | Admitting: Obstetrics & Gynecology

## 2019-05-08 VITALS — BP 127/76 | HR 100 | Wt 231.0 lb

## 2019-05-08 DIAGNOSIS — O09292 Supervision of pregnancy with other poor reproductive or obstetric history, second trimester: Secondary | ICD-10-CM

## 2019-05-08 DIAGNOSIS — O365930 Maternal care for other known or suspected poor fetal growth, third trimester, not applicable or unspecified: Secondary | ICD-10-CM

## 2019-05-08 DIAGNOSIS — Z8759 Personal history of other complications of pregnancy, childbirth and the puerperium: Secondary | ICD-10-CM

## 2019-05-08 DIAGNOSIS — O0990 Supervision of high risk pregnancy, unspecified, unspecified trimester: Secondary | ICD-10-CM | POA: Diagnosis not present

## 2019-05-08 DIAGNOSIS — O24414 Gestational diabetes mellitus in pregnancy, insulin controlled: Secondary | ICD-10-CM

## 2019-05-08 DIAGNOSIS — Z3A36 36 weeks gestation of pregnancy: Secondary | ICD-10-CM

## 2019-05-08 DIAGNOSIS — O09293 Supervision of pregnancy with other poor reproductive or obstetric history, third trimester: Secondary | ICD-10-CM

## 2019-05-08 DIAGNOSIS — O0993 Supervision of high risk pregnancy, unspecified, third trimester: Secondary | ICD-10-CM

## 2019-05-08 DIAGNOSIS — O365990 Maternal care for other known or suspected poor fetal growth, unspecified trimester, not applicable or unspecified: Secondary | ICD-10-CM

## 2019-05-08 MED ORDER — INSULIN NPH (HUMAN) (ISOPHANE) 100 UNIT/ML ~~LOC~~ SUSP: SUBCUTANEOUS | 3 refills | Status: DC

## 2019-05-08 NOTE — Patient Instructions (Signed)

## 2019-05-08 NOTE — Progress Notes (Signed)
   PRENATAL VISIT NOTE  Subjective:  Joanna [name] is a 28 y.o. [phone] at [redacted]w[redacted]d being seen today for ongoing prenatal care.  She is currently monitored for the following issues for this high-risk pregnancy and has TOBACCO ABUSE; Mild intermittent asthma; Eczema; Maternal obesity affecting pregnancy, antepartum; Supervision of high risk pregnancy, antepartum; History of IUFD; Rubella non-immune status, antepartum; GBS bacteriuria; Fetal growth retardation, antenatal; GDM (gestational diabetes mellitus); Transient hypertension of pregnancy; Polyhydramnios affecting pregnancy; and Vaginal discharge during pregnancy in third trimester on their problem list.  Patient reports no complaints.  Contractions: Irritability. Vag. Bleeding: None.  Movement: Present. Denies leaking of fluid.   The following portions of the patient's history were reviewed and updated as appropriate: allergies, current medications, past family history, past medical history, past social history, past surgical history and problem list.   Objective:   Vitals:   05/08/19 1019  BP: 127/76  Pulse: 100  Weight: 231 lb (104.8 kg)    Fetal Status: Fetal Heart Rate (bpm): NST Fundal Height: 36 cm Movement: Present  Presentation: Vertex  General:  Alert, oriented and cooperative. Patient is in no acute distress.  Skin: Skin is warm and dry. No rash noted.   Cardiovascular: Normal heart rate noted  Respiratory: Normal respiratory effort, no problems with respiration noted  Abdomen: Soft, gravid, appropriate for gestational age.  Pain/Pressure: Present     Pelvic: Cervical exam performed Dilation: 2 Effacement (%): 50 Station: -3  Extremities: Normal range of motion.  Edema: None  Mental Status: Normal mood and affect. Normal behavior. Normal judgment and thought content.   Assessment and Plan:  Pregnancy: [id number] at [redacted]w[redacted]d 1. History of IUFD IOL 64 weelk  2. Supervision of high risk pregnancy, antepartum Increase PM NPH  - GC/Chlamydia probe amp ([place])not at [facility] - insulin NPH Human (HUMULIN N) 100 UNIT/ML injection; Inject 22 units in the morning and 28 units in the evening  Dispense: 10 mL; Refill: 3  3. Fetal growth retardation, antenatal   4. Insulin controlled gestational diabetes mellitus (GDM) in third trimester FBS need better control  Preterm labor symptoms and general obstetric precautions including but not limited to vaginal bleeding, contractions, leaking of fluid and fetal movement were reviewed in detail with the patient. Please refer to After Visit Summary for other counseling recommendations.   Return in about 1 week (around 05/15/2019) for BPP NST.  Future Appointments  Date Time Provider [place]  05/15/2019  9:15 AM WOC-WOCA NST [facility] WOC  05/15/2019 10:15 AM Chancy Milroy, MD [facility]OC[facility] WOC  05/22/2019  9:15 AM WOC-WOCA NST WOC[facility]A WOC  05/22/2019 10:15 AM Donnamae Jude, MD [facility] WOC  05/29/2019  9:15 AM WOC-WOCA NST [facility]OC-[facility] WOC  05/29/2019 10:15 AM Anyanwu, Sallyanne Havers, MD [facility] WOC    Emeterio Reeve, MD

## 2019-05-08 NOTE — Progress Notes (Signed)
Pt informed that the ultrasound is considered a limited OB ultrasound and is not intended to be a complete ultrasound exam.  Patient also informed that the ultrasound is not being completed with the intent of assessing for fetal or placental anomalies or any pelvic abnormalities.  Explained that the purpose of today's ultrasound is to assess for  BPP, presentation and AFI.  Patient acknowledges the purpose of the exam and the limitations of the study.    Koren Bound RN BSN 05/08/19

## 2019-05-10 LAB — GC/CHLAMYDIA PROBE AMP (~~LOC~~) NOT AT ARMC
Chlamydia: NEGATIVE
Comment: NEGATIVE
Comment: NORMAL
Neisseria Gonorrhea: NEGATIVE

## 2019-05-15 ENCOUNTER — Other Ambulatory Visit: Payer: Self-pay | Admitting: Advanced Practice Midwife

## 2019-05-15 ENCOUNTER — Telehealth (HOSPITAL_COMMUNITY): Payer: Self-pay | Admitting: *Deleted

## 2019-05-15 ENCOUNTER — Other Ambulatory Visit: Payer: Self-pay

## 2019-05-15 ENCOUNTER — Ambulatory Visit (INDEPENDENT_AMBULATORY_CARE_PROVIDER_SITE_OTHER): Payer: Medicaid Other | Admitting: Obstetrics and Gynecology

## 2019-05-15 ENCOUNTER — Encounter: Payer: Self-pay | Admitting: Obstetrics and Gynecology

## 2019-05-15 ENCOUNTER — Ambulatory Visit: Payer: Self-pay

## 2019-05-15 ENCOUNTER — Ambulatory Visit (INDEPENDENT_AMBULATORY_CARE_PROVIDER_SITE_OTHER): Payer: Medicaid Other | Admitting: General Practice

## 2019-05-15 VITALS — BP 122/69 | HR 94 | Wt 236.0 lb

## 2019-05-15 DIAGNOSIS — O09899 Supervision of other high risk pregnancies, unspecified trimester: Secondary | ICD-10-CM

## 2019-05-15 DIAGNOSIS — O365993 Maternal care for other known or suspected poor fetal growth, unspecified trimester, fetus 3: Secondary | ICD-10-CM

## 2019-05-15 DIAGNOSIS — O24414 Gestational diabetes mellitus in pregnancy, insulin controlled: Secondary | ICD-10-CM

## 2019-05-15 DIAGNOSIS — O09293 Supervision of pregnancy with other poor reproductive or obstetric history, third trimester: Secondary | ICD-10-CM

## 2019-05-15 DIAGNOSIS — O409XX0 Polyhydramnios, unspecified trimester, not applicable or unspecified: Secondary | ICD-10-CM

## 2019-05-15 DIAGNOSIS — R8271 Bacteriuria: Secondary | ICD-10-CM

## 2019-05-15 DIAGNOSIS — O0993 Supervision of high risk pregnancy, unspecified, third trimester: Secondary | ICD-10-CM

## 2019-05-15 DIAGNOSIS — O0990 Supervision of high risk pregnancy, unspecified, unspecified trimester: Secondary | ICD-10-CM

## 2019-05-15 DIAGNOSIS — Z283 Underimmunization status: Secondary | ICD-10-CM

## 2019-05-15 DIAGNOSIS — Z3A37 37 weeks gestation of pregnancy: Secondary | ICD-10-CM

## 2019-05-15 DIAGNOSIS — O99820 Streptococcus B carrier state complicating pregnancy: Secondary | ICD-10-CM

## 2019-05-15 DIAGNOSIS — O365990 Maternal care for other known or suspected poor fetal growth, unspecified trimester, not applicable or unspecified: Secondary | ICD-10-CM

## 2019-05-15 DIAGNOSIS — Z8759 Personal history of other complications of pregnancy, childbirth and the puerperium: Secondary | ICD-10-CM

## 2019-05-15 DIAGNOSIS — O99891 Other specified diseases and conditions complicating pregnancy: Secondary | ICD-10-CM

## 2019-05-15 NOTE — Progress Notes (Signed)
Subjective:  Joanna [name] is a 28 y.o. [phone] at [redacted]w[redacted]d being seen today for ongoing prenatal care.  She is currently monitored for the following issues for this high-risk pregnancy and has TOBACCO ABUSE; Mild intermittent asthma; Eczema; Maternal obesity affecting pregnancy, antepartum; Supervision of high risk pregnancy, antepartum; History of IUFD; Rubella non-immune status, antepartum; GBS bacteriuria; Fetal growth retardation, antenatal; GDM (gestational diabetes mellitus); Transient hypertension of pregnancy; Polyhydramnios affecting pregnancy; and Vaginal discharge during pregnancy in third trimester on their problem list.  Patient reports general discomforts of pregnancy.  Contractions: Irritability. Vag. Bleeding: None.  Movement: Present. Denies leaking of fluid.   The following portions of the patient's history were reviewed and updated as appropriate: allergies, current medications, past family history, past medical history, past social history, past surgical history and problem list. Problem list updated.  Objective:   Vitals:   05/15/19 1016  BP: 122/69  Pulse: 94  Weight: 236 lb (107 kg)    Fetal Status: Fetal Heart Rate (bpm): NST   Movement: Present     General:  Alert, oriented and cooperative. Patient is in no acute distress.  Skin: Skin is warm and dry. No rash noted.   Cardiovascular: Normal heart rate noted  Respiratory: Normal respiratory effort, no problems with respiration noted  Abdomen: Soft, gravid, appropriate for gestational age. Pain/Pressure: Present     Pelvic:  Cervical exam deferred        Extremities: Normal range of motion.  Edema: None  Mental Status: Normal mood and affect. Normal behavior. Normal judgment and thought content.   Urinalysis:      Assessment and Plan:  Pregnancy: [id number] at [redacted]w[redacted]d  1. Supervision of high risk pregnancy, antepartum Stable Labor precautions  2. Insulin controlled gestational diabetes mellitus (GDM) in third  trimester CBG's in goal range BPP 10/10 today EFW 14 % on 05/01/19 IOL scheduled for next week  3. GBS bacteriuria Tx while in labor  4. History of IUFD IOL next week  5. Polyhydramnios affecting pregnancy Nl AFI on 05/01/19  6. Rubella non-immune status, antepartum Vaccine PP  7. Fetal growth retardation, antenatal EFW 14 % on 05/01/19  Term labor symptoms and general obstetric precautions including but not limited to vaginal bleeding, contractions, leaking of fluid and fetal movement were reviewed in detail with the patient. Please refer to After Visit Summary for other counseling recommendations.  No follow-ups on file.   Chancy Milroy, MD

## 2019-05-15 NOTE — Progress Notes (Signed)
Scheduled pt IOL on 05/21/19 in the morning per L&D.  Pt notified.

## 2019-05-15 NOTE — Telephone Encounter (Signed)
Preadmission screen  

## 2019-05-15 NOTE — Patient Instructions (Signed)

## 2019-05-15 NOTE — Progress Notes (Signed)
Pt informed that the ultrasound is considered a limited OB ultrasound and is not intended to be a complete ultrasound exam.  Patient also informed that the ultrasound is not being completed with the intent of assessing for fetal or placental anomalies or any pelvic abnormalities.  Explained that the purpose of today's ultrasound is to assess for  BPP, presentation and AFI.  Patient acknowledges the purpose of the exam and the limitations of the study.    NST/BPP today 8/10. Patient will see Dr Rip Harbour for [facility] visit.   Koren Bound RN BSN 05/15/19

## 2019-05-19 ENCOUNTER — Other Ambulatory Visit: Payer: Self-pay

## 2019-05-19 ENCOUNTER — Other Ambulatory Visit (HOSPITAL_COMMUNITY)
Admission: RE | Admit: 2019-05-19 | Discharge: 2019-05-19 | Disposition: A | Discharge status: Home / Self Care | Payer: Medicaid Other | Source: Ambulatory Visit | Attending: Obstetrics and Gynecology | Admitting: Obstetrics and Gynecology

## 2019-05-19 DIAGNOSIS — Z20828 Contact with and (suspected) exposure to other viral communicable diseases: Secondary | ICD-10-CM | POA: Insufficient documentation

## 2019-05-19 LAB — SARS CORONAVIRUS 2 (TAT 6-24 HRS): SARS Coronavirus 2: NEGATIVE

## 2019-05-19 NOTE — MAU Note (Signed)
Pt here for PAT covid swab, denies symptoms. Swab collected. 

## 2019-05-21 ENCOUNTER — Inpatient Hospital Stay (HOSPITAL_COMMUNITY): Payer: Medicaid Other

## 2019-05-21 ENCOUNTER — Inpatient Hospital Stay (HOSPITAL_COMMUNITY)
Admission: AD | Admit: 2019-05-21 | Discharge: 2019-05-23 | DRG: 807 | Disposition: A | Discharge status: Home / Self Care | Payer: Medicaid Other | Attending: Family Medicine | Admitting: Family Medicine

## 2019-05-21 ENCOUNTER — Inpatient Hospital Stay (HOSPITAL_COMMUNITY): Payer: Medicaid Other | Admitting: Anesthesiology

## 2019-05-21 ENCOUNTER — Encounter (HOSPITAL_COMMUNITY): Payer: Self-pay | Admitting: *Deleted

## 2019-05-21 DIAGNOSIS — O24424 Gestational diabetes mellitus in childbirth, insulin controlled: Secondary | ICD-10-CM | POA: Diagnosis not present

## 2019-05-21 DIAGNOSIS — O99824 Streptococcus B carrier state complicating childbirth: Secondary | ICD-10-CM | POA: Diagnosis present

## 2019-05-21 DIAGNOSIS — R8271 Bacteriuria: Secondary | ICD-10-CM | POA: Diagnosis present

## 2019-05-21 DIAGNOSIS — F17210 Nicotine dependence, cigarettes, uncomplicated: Secondary | ICD-10-CM | POA: Diagnosis present

## 2019-05-21 DIAGNOSIS — Z23 Encounter for immunization: Secondary | ICD-10-CM | POA: Diagnosis not present

## 2019-05-21 DIAGNOSIS — O133 Gestational [pregnancy-induced] hypertension without significant proteinuria, third trimester: Secondary | ICD-10-CM

## 2019-05-21 DIAGNOSIS — Z283 Underimmunization status: Secondary | ICD-10-CM

## 2019-05-21 DIAGNOSIS — J4520 Mild intermittent asthma, uncomplicated: Secondary | ICD-10-CM | POA: Diagnosis present

## 2019-05-21 DIAGNOSIS — O09899 Supervision of other high risk pregnancies, unspecified trimester: Secondary | ICD-10-CM

## 2019-05-21 DIAGNOSIS — O99334 Smoking (tobacco) complicating childbirth: Secondary | ICD-10-CM | POA: Diagnosis present

## 2019-05-21 DIAGNOSIS — O9952 Diseases of the respiratory system complicating childbirth: Secondary | ICD-10-CM | POA: Diagnosis present

## 2019-05-21 DIAGNOSIS — N898 Other specified noninflammatory disorders of vagina: Secondary | ICD-10-CM

## 2019-05-21 DIAGNOSIS — O164 Unspecified maternal hypertension, complicating childbirth: Secondary | ICD-10-CM | POA: Diagnosis not present

## 2019-05-21 DIAGNOSIS — O26893 Other specified pregnancy related conditions, third trimester: Secondary | ICD-10-CM

## 2019-05-21 DIAGNOSIS — O409XX0 Polyhydramnios, unspecified trimester, not applicable or unspecified: Secondary | ICD-10-CM

## 2019-05-21 DIAGNOSIS — O99214 Obesity complicating childbirth: Secondary | ICD-10-CM | POA: Diagnosis present

## 2019-05-21 DIAGNOSIS — Z8759 Personal history of other complications of pregnancy, childbirth and the puerperium: Secondary | ICD-10-CM

## 2019-05-21 DIAGNOSIS — Z2839 Other underimmunization status: Secondary | ICD-10-CM

## 2019-05-21 DIAGNOSIS — Z3A38 38 weeks gestation of pregnancy: Secondary | ICD-10-CM

## 2019-05-21 DIAGNOSIS — O24419 Gestational diabetes mellitus in pregnancy, unspecified control: Secondary | ICD-10-CM | POA: Diagnosis present

## 2019-05-21 DIAGNOSIS — O2492 Unspecified diabetes mellitus in childbirth: Secondary | ICD-10-CM | POA: Diagnosis not present

## 2019-05-21 DIAGNOSIS — O99210 Obesity complicating pregnancy, unspecified trimester: Secondary | ICD-10-CM | POA: Diagnosis present

## 2019-05-21 LAB — CBC
HCT: 37.1 % (ref 36.0–46.0)
Hemoglobin: 12.4 g/dL (ref 12.0–15.0)
MCH: 30.3 pg (ref 26.0–34.0)
MCHC: 33.4 g/dL (ref 30.0–36.0)
MCV: 90.7 fL (ref 80.0–100.0)
Platelets: 329 10*3/uL (ref 150–400)
RBC: 4.09 MIL/uL (ref 3.87–5.11)
RDW: 14.4 % (ref 11.5–15.5)
WBC: 12.2 10*3/uL — ABNORMAL HIGH (ref 4.0–10.5)
nRBC: 0 % (ref 0.0–0.2)

## 2019-05-21 LAB — GLUCOSE, CAPILLARY
Glucose-Capillary: 115 mg/dL — ABNORMAL HIGH (ref 70–99)
Glucose-Capillary: 107 mg/dL — ABNORMAL HIGH (ref 70–99)
Glucose-Capillary: 90 mg/dL (ref 70–99)
Glucose-Capillary: 113 mg/dL — ABNORMAL HIGH (ref 70–99)
Glucose-Capillary: 77 mg/dL (ref 70–99)
Glucose-Capillary: 79 mg/dL (ref 70–99)

## 2019-05-21 LAB — OB RESULTS CONSOLE GBS: GBS: POSITIVE

## 2019-05-21 LAB — TYPE AND SCREEN
ABO/RH(D): A POS
Antibody Screen: NEGATIVE

## 2019-05-21 LAB — ABO/RH: ABO/RH(D): A POS

## 2019-05-21 LAB — RPR: RPR Ser Ql: NONREACTIVE

## 2019-05-21 MED ORDER — TERBUTALINE SULFATE 1 MG/ML IJ SOLN: 0.2500 mg | Freq: Once | INTRAMUSCULAR | Status: DC | PRN

## 2019-05-21 MED ORDER — ONDANSETRON HCL 4 MG/2ML IJ SOLN: 4.0000 mg | Freq: Four times a day (QID) | INTRAMUSCULAR | Status: DC | PRN

## 2019-05-21 MED ORDER — SIMETHICONE 80 MG PO CHEW: 80.0000 mg | CHEWABLE_TABLET | ORAL | Status: DC | PRN

## 2019-05-21 MED ORDER — OXYTOCIN BOLUS FROM INFUSION
500.0000 mL | Freq: Once | INTRAVENOUS | Status: AC
  Administered 2019-05-21: 20:00:00 500 mL via INTRAVENOUS

## 2019-05-21 MED ORDER — IBUPROFEN 600 MG PO TABS
600.0000 mg | ORAL_TABLET | Freq: Four times a day (QID) | ORAL | Status: DC
  Administered 2019-05-21 – 2019-05-23 (×5): 600 mg via ORAL
  Filled 2019-05-21 (×4): qty 1

## 2019-05-21 MED ORDER — LACTATED RINGERS IV SOLN
INTRAVENOUS | Status: DC
  Administered 2019-05-21 (×3): via INTRAVENOUS

## 2019-05-21 MED ORDER — OXYTOCIN 40 UNITS IN NORMAL SALINE INFUSION - SIMPLE MED
1.0000 m[IU]/min | INTRAVENOUS | Status: DC
  Administered 2019-05-21: 09:00:00 2 m[IU]/min via INTRAVENOUS

## 2019-05-21 MED ORDER — PENICILLIN G POT IN DEXTROSE 60000 UNIT/ML IV SOLN
3.0000 10*6.[IU] | INTRAVENOUS | Status: DC
  Administered 2019-05-21 (×2): 3 10*6.[IU] via INTRAVENOUS
  Filled 2019-05-21 (×5): qty 50

## 2019-05-21 MED ORDER — SODIUM CHLORIDE 0.9 % IV SOLN
5.0000 10*6.[IU] | Freq: Once | INTRAVENOUS | Status: AC
  Administered 2019-05-21: 09:00:00 5 10*6.[IU] via INTRAVENOUS
  Filled 2019-05-21: qty 5

## 2019-05-21 MED ORDER — ONDANSETRON HCL 4 MG/2ML IJ SOLN: 4.0000 mg | INTRAMUSCULAR | Status: DC | PRN

## 2019-05-21 MED ORDER — ONDANSETRON HCL 4 MG PO TABS: 4.0000 mg | ORAL_TABLET | ORAL | Status: DC | PRN

## 2019-05-21 MED ORDER — LACTATED RINGERS IV SOLN
500.0000 mL | INTRAVENOUS | Status: DC | PRN
  Administered 2019-05-21: 19:00:00 500 mL via INTRAVENOUS

## 2019-05-21 MED ORDER — MEASLES, MUMPS & RUBELLA VAC IJ SOLR
0.5000 mL | Freq: Once | INTRAMUSCULAR | Status: AC
  Administered 2019-05-23: 09:00:00 0.5 mL via SUBCUTANEOUS
  Filled 2019-05-21: qty 0.5

## 2019-05-21 MED ORDER — BENZOCAINE-MENTHOL 20-0.5 % EX AERO: 1.0000 "application " | INHALATION_SPRAY | CUTANEOUS | Status: DC | PRN

## 2019-05-21 MED ORDER — OXYTOCIN 40 UNITS IN NORMAL SALINE INFUSION - SIMPLE MED
2.5000 [IU]/h | INTRAVENOUS | Status: DC
  Administered 2019-05-21: 21:00:00 2.5 [IU]/h via INTRAVENOUS
  Filled 2019-05-21: qty 1000

## 2019-05-21 MED ORDER — FENTANYL CITRATE (PF) 100 MCG/2ML IJ SOLN
50.0000 ug | INTRAMUSCULAR | Status: DC | PRN
  Administered 2019-05-21 (×2): 100 ug via INTRAVENOUS
  Filled 2019-05-21 (×2): qty 2

## 2019-05-21 MED ORDER — OXYCODONE-ACETAMINOPHEN 5-325 MG PO TABS
1.0000 | ORAL_TABLET | ORAL | Status: DC | PRN
  Administered 2019-05-21: 21:00:00 1 via ORAL
  Filled 2019-05-21: qty 1

## 2019-05-21 MED ORDER — DIPHENHYDRAMINE HCL 25 MG PO CAPS
25.0000 mg | ORAL_CAPSULE | Freq: Four times a day (QID) | ORAL | Status: DC | PRN
  Administered 2019-05-21: 23:00:00 25 mg via ORAL
  Filled 2019-05-21: qty 1

## 2019-05-21 MED ORDER — LIDOCAINE HCL (PF) 1 % IJ SOLN: 30.0000 mL | INTRAMUSCULAR | Status: DC | PRN

## 2019-05-21 MED ORDER — PHENYLEPHRINE 40 MCG/ML (10ML) SYRINGE FOR IV PUSH (FOR BLOOD PRESSURE SUPPORT): 80.0000 ug | PREFILLED_SYRINGE | INTRAVENOUS | Status: DC | PRN

## 2019-05-21 MED ORDER — OXYCODONE-ACETAMINOPHEN 5-325 MG PO TABS: 2.0000 | ORAL_TABLET | ORAL | Status: DC | PRN

## 2019-05-21 MED ORDER — PRENATAL MULTIVITAMIN CH
1.0000 | ORAL_TABLET | Freq: Every day | ORAL | Status: DC
  Administered 2019-05-22: 12:00:00 1 via ORAL
  Filled 2019-05-21: qty 1

## 2019-05-21 MED ORDER — EPHEDRINE 5 MG/ML INJ: 10.0000 mg | INTRAVENOUS | Status: DC | PRN

## 2019-05-21 MED ORDER — WITCH HAZEL-GLYCERIN EX PADS: 1.0000 "application " | MEDICATED_PAD | CUTANEOUS | Status: DC | PRN

## 2019-05-21 MED ORDER — PENICILLIN G 3 MILLION UNITS IVPB - SIMPLE MED
3.0000 10*6.[IU] | INTRAVENOUS | Status: DC
  Filled 2019-05-21 (×3): qty 100

## 2019-05-21 MED ORDER — ACETAMINOPHEN 325 MG PO TABS: 650.0000 mg | ORAL_TABLET | ORAL | Status: DC | PRN

## 2019-05-21 MED ORDER — ACETAMINOPHEN 325 MG PO TABS
650.0000 mg | ORAL_TABLET | ORAL | Status: DC | PRN
  Administered 2019-05-22 – 2019-05-23 (×3): 650 mg via ORAL
  Filled 2019-05-21 (×3): qty 2

## 2019-05-21 MED ORDER — COCONUT OIL OIL: 1.0000 "application " | TOPICAL_OIL | Status: DC | PRN

## 2019-05-21 MED ORDER — SOD CITRATE-CITRIC ACID 500-334 MG/5ML PO SOLN: 30.0000 mL | ORAL | Status: DC | PRN

## 2019-05-21 MED ORDER — LACTATED RINGERS IV SOLN: 500.0000 mL | Freq: Once | INTRAVENOUS | Status: DC

## 2019-05-21 MED ORDER — PHENYLEPHRINE 40 MCG/ML (10ML) SYRINGE FOR IV PUSH (FOR BLOOD PRESSURE SUPPORT)
80.0000 ug | PREFILLED_SYRINGE | INTRAVENOUS | Status: DC | PRN
  Filled 2019-05-21: qty 10

## 2019-05-21 MED ORDER — DIBUCAINE (PERIANAL) 1 % EX OINT: 1.0000 "application " | TOPICAL_OINTMENT | CUTANEOUS | Status: DC | PRN

## 2019-05-21 MED ORDER — LIDOCAINE HCL (PF) 1 % IJ SOLN
INTRAMUSCULAR | Status: DC | PRN
  Administered 2019-05-21 (×2): 5 mL via EPIDURAL

## 2019-05-21 MED ORDER — DIPHENHYDRAMINE HCL 50 MG/ML IJ SOLN: 12.5000 mg | INTRAMUSCULAR | Status: DC | PRN

## 2019-05-21 MED ORDER — SENNOSIDES-DOCUSATE SODIUM 8.6-50 MG PO TABS
2.0000 | ORAL_TABLET | ORAL | Status: DC
  Administered 2019-05-21 – 2019-05-23 (×2): 2 via ORAL
  Filled 2019-05-21: qty 2

## 2019-05-21 MED ORDER — FENTANYL-BUPIVACAINE-NACL 0.5-0.125-0.9 MG/250ML-% EP SOLN
12.0000 mL/h | EPIDURAL | Status: DC | PRN
  Filled 2019-05-21: qty 250

## 2019-05-21 MED ORDER — SODIUM CHLORIDE (PF) 0.9 % IJ SOLN
INTRAMUSCULAR | Status: DC | PRN
  Administered 2019-05-21: 12 mL/h via EPIDURAL

## 2019-05-21 NOTE — Discharge Summary (Signed)
Postpartum Discharge Summary     Patient Name: Joanna [name] DOB: December 24, 1990 MRN: [id number]  Date of admission: 05/21/2019 Delivering Provider: Serita Grammes D   Date of discharge: 05/23/2019  Admitting diagnosis: PREG Intrauterine pregnancy: [redacted]w[redacted]d    Secondary diagnosis:  Active Problems:   Mild intermittent asthma   Maternal obesity affecting pregnancy, antepartum   History of IUFD   Rubella non-immune status, antepartum   GBS bacteriuria   Gestational diabetes  Additional problems: none     Discharge diagnosis: Term Pregnancy Delivered and GDM A2                                                                                                Post partum procedures:received MMR and pneumococcal vax prior to d/c  Augmentation: AROM and Pitocin  Complications: None  Hospital course:  Induction of Labor With Vaginal Delivery   28y.o. yo [phone] [id number] admitted to the hospital 05/21/2019 for induction of labor.  Indication for induction: A2 DM and hx of IUFD. Patient had an uncomplicated induction course requiring Pitocin and AROM; she also received PCN for GBS ppx and had an epidural placed. Membrane Rupture Time/Date: 4:09 PM ,05/21/2019   Intrapartum Procedures: Episiotomy: None [1]                                         Lacerations:  None [1]  Patient had delivery of a Viable infant.  Information for the patient's newborn:  ChMalyssa, Maris0[[id number]]Delivery Method: Vaginal, Spontaneous(Filed from Delivery Summary)    05/21/2019  Details of delivery can be found in separate delivery note.  Patient had a routine postpartum course. Patient is discharged home 05/23/19. Delivery time: 8:05 PM    Magnesium Sulfate received: No BMZ received: No Rhophylac:N/A MMR:Yes Transfusion:No  Physical exam  Vitals:   05/22/19 0330 05/22/19 0730 05/22/19 1130 05/23/19 0529  BP: (!) 107/52 121/74 140/86 112/64  Pulse: 72 83 (!) 108 75  Resp: _0 Temp:  98.5 F (36.9 C) 98.7 F (37.1 C) 98.2 F (36.8 C) 98.9 F (37.2 C)  TempSrc: Oral Oral Oral   SpO2:      Weight:      Height:       General: alert and cooperative Lochia: appropriate Uterine Fundus: firm Incision: N/A DVT Evaluation: No evidence of DVT seen on physical exam. Labs: Lab Results  Component Value Date   WBC 12.2 (H) 05/21/2019   HGB 12.4 05/21/2019   HCT 37.1 05/21/2019   MCV 90.7 05/21/2019   PLT 329 05/21/2019   CMP Latest Ref Rng & Units 05/22/2019  Glucose 70 - 99 mg/dL 97  BUN 6 - 20 mg/dL -  Creatinine 0.57 - 1.00 mg/dL -  Sodium 134 - 144 mmol/L -  Potassium 3.5 - 5.2 mmol/L -  Chloride 96 - 106 mmol/L -  CO2 20 - 29 mmol/L -  Calcium 8.7 - 10.2 mg/dL -  Total Protein  6.0 - 8.5 g/dL -  Total Bilirubin 0.0 - 1.2 mg/dL -  Alkaline Phos 39 - 117 IU/L -  AST 0 - 40 IU/L -  ALT 0 - 32 IU/L -    Discharge instruction: per After Visit Summary and "Baby and Me Booklet".  After visit meds:  Allergies as of 05/23/2019   No Known Allergies     Medication List    STOP taking these medications   Accu-Chek Aviva Plus test strip Generic drug: glucose blood   Accu-Chek FastClix Lancets Misc   AMBULATORY NON FORMULARY MEDICATION   aspirin EC 81 MG tablet   ferrous sulfate 325 (65 FE) MG tablet   insulin NPH Human 100 UNIT/ML injection Commonly known as: HumuLIN N   Insulin Syringes (Disposable) U-100 0.5 ML Misc     TAKE these medications   albuterol 108 (90 Base) MCG/ACT inhaler Commonly known as: VENTOLIN HFA Inhale 2 puffs into the lungs every 4 (four) hours as needed for wheezing or shortness of breath.   Flovent HFA 44 MCG/ACT inhaler Generic drug: fluticasone Inhale 2 puffs into the lungs 2 (two) times daily.   ibuprofen 600 MG tablet Commonly known as: ADVIL Take 1 tablet (600 mg total) by mouth every 6 (six) hours as needed.   Prenatal Vitamin 27-0.8 MG Tabs Take 1 tablet by mouth daily.       Diet: routine  diet  Activity: Advance as tolerated. Pelvic rest for 6 weeks.   Outpatient follow up:4wks with GTT Follow up Appt: Future Appointments  Date Time Provider [place]  06/19/2019 10:55 AM Tresea Mall, CNM WOC-WOCA [place]  07/04/2019  8:20 AM WOC-WOCA LAB [facility]OC-W[facility] WOC   Follow up Visit:  Please schedule this patient for Postpartum visit in: 4 weeks with the following provider: MD For C/S patients schedule nurse incision check in weeks 2 weeks: no High risk pregnancy complicated by: GDMA2; hx IUFD prev preg Delivery mode:  SVD Anticipated Birth Control:  other/unsure PP Procedures needed: 2 hour GTT  Schedule Integrated BH visit: no   Newborn Data: Live born female  Birth Weight: 2540gm (5lb 9.6oz)  APGAR: 66, 9  Newborn Delivery   Birth date/time: 05/21/2019 20:05:00 Delivery type: Vaginal, Spontaneous      Baby Feeding: Bottle Disposition:home with mother   05/23/2019 Myrtis Ser, CNM  10:02 AM

## 2019-05-21 NOTE — Anesthesia Procedure Notes (Signed)
Epidural Patient location during procedure: OB Start time: 05/21/2019 3:02 PM End time: 05/21/2019 3:16 PM  Staffing Anesthesiologist: Duane Boston, MD Performed: anesthesiologist   Preanesthetic Checklist Completed: patient identified, site marked, pre-op evaluation, timeout performed, IV checked, risks and benefits discussed and monitors and equipment checked  Epidural Patient position: sitting Prep: DuraPrep Patient monitoring: heart rate, cardiac monitor, continuous pulse ox and blood pressure Approach: midline Location: L2-L3 Injection technique: LOR saline  Needle:  Needle type: Tuohy  Needle gauge: 17 G Needle length: 9 cm Needle insertion depth: 6 cm Catheter size: 20 Guage Catheter at skin depth: 12 cm Test dose: negative and Other  Assessment Events: blood not aspirated, injection not painful, no injection resistance and negative IV test  Additional Notes Informed consent obtained prior to proceeding including risk of failure, 1% risk of PDPH, risk of minor discomfort and bruising.  Discussed rare but serious complications including epidural abscess, permanent nerve injury, epidural hematoma.  Discussed alternatives to epidural analgesia and patient desires to proceed.  Timeout performed pre-procedure verifying patient name, procedure, and platelet count.  Patient tolerated procedure well.

## 2019-05-21 NOTE — Progress Notes (Signed)
Patient ID: Joanna [name], female   DOB: October 24, 1990, 27 y.o.   MRN: [id number]  Epidural placed- pretty comfortable; PCN x 2 doses  BP 111/70, P 76 FHR 130s, +accels, no decels Ctx q 2-5 mins with Pit at 43mu/min Cx 4-5/70/vtx -2; AROM for clear fluid  CBGs 90-115  IUP@38 .5wks GDMA2 IOL process GBS pos  Hopeful that she will get into more active labor now that membranes are ruptured Keep checking CBGs q 2h Anticipate vag del  Myrtis Ser [facility] 05/21/2019 4:24 PM

## 2019-05-21 NOTE — Progress Notes (Signed)
Joanna [name] is a 28 y.o. female [phone] with IUP at [redacted]w[redacted]d by LMP presenting for IOL for hx of IUFD, GDMA2 (NPH insulin), polyhydramnios, and IUGR with EFW 14% on 05/01/2019.  Subjective: Reporting mild intermittent discomfort at this time with contractions. Denies need for pain medication at this time, but is planning for an epidural when needed.  Objective: Vitals:   05/21/19 1207 05/21/19 1239 05/21/19 1245 05/21/19 1315  BP: 117/71 111/73  136/82  Pulse: 82 71  77  Resp: 18 18 18 18   Temp: 98.7 F (37.1 C)     TempSrc: Oral     Weight:      Height:        No intake/output data recorded. No intake/output data recorded.   FHT:  FHR: 135 bpm, variability: moderate,  accelerations:  Present,  decelerations:  Absent UC:   regular, every 2-5 minutes SVE:   Dilation: 3.5 Effacement (%): 50 Station: -2 Exam by:: Angus, rn  Labs:   Recent Labs    05/21/19 0728  WBC 12.2*  HGB 12.4  HCT 37.1  PLT 329    Assessment / Plan: Joanna [name] is a 28 y.o. female [phone] with IUP at [redacted]w[redacted]d by LMP presenting for IOL for hx of IUFD, GDMA2 (NPH insulin), polyhydramnios, and IUGR with EFW 14% on 05/01/2019.  Labor: Progressing on Pitocin, will continue to increase then AROM Preeclampsia:  no signs or symptoms of toxicity and labs stable Fetal Wellbeing:  Category I Pain Control:  None - Is planning for epidural I/D:  GBS positive - GBS antibiotic prophylaxis per protocol Anticipated MOD:  NSVD  Joanna [name], CNM, BSN 05/21/2019, 1:58 PM

## 2019-05-21 NOTE — Anesthesia Preprocedure Evaluation (Signed)
Anesthesia Evaluation  Patient identified by MRN, date of birth, ID band Patient awake and Patient confused    Reviewed: Allergy & Precautions, H&P , NPO status , Patient's Chart, lab work & pertinent test results  Airway Mallampati: II   Neck ROM: full    Dental  (+) Teeth Intact   Pulmonary asthma , Current Smoker,    Pulmonary exam normal breath sounds clear to auscultation       Cardiovascular Exercise Tolerance: Good hypertension,  Rhythm:regular Rate:Normal     Neuro/Psych PSYCHIATRIC DISORDERS    GI/Hepatic   Endo/Other  diabetesMorbid obesity  Renal/GU      Musculoskeletal   Abdominal Normal abdominal exam  (+) + obese,   Peds  Hematology   Anesthesia Other Findings   Reproductive/Obstetrics (+) Pregnancy                             Anesthesia Physical  Anesthesia Plan  ASA: III  Anesthesia Plan: Epidural   Post-op Pain Management:    Induction:   PONV Risk Score and Plan:   Airway Management Planned:   Additional Equipment:   Intra-op Plan:   Post-operative Plan:   Informed Consent: I have reviewed the patients History and Physical, chart, labs and discussed the procedure including the risks, benefits and alternatives for the proposed anesthesia with the patient or authorized representative who has indicated his/her understanding and acceptance.       Plan Discussed with: Anesthesiologist  Anesthesia Plan Comments:         Anesthesia Quick Evaluation

## 2019-05-21 NOTE — Progress Notes (Signed)
Joanna S Chavisis a 28 y.o.[id number] with IUP at [redacted]w[redacted]d by LMPpresenting forIOL for hx of IUFD, GDMA2(NPH insulin), and polyhydramnios.  Subjective: Reports feeling intermittent pelvic pressure and discomfort with contractions at this time.  Objective: Vitals:   05/21/19 1701 05/21/19 1731 05/21/19 1801 05/21/19 1831  BP: 122/76 121/77 126/66 111/64  Pulse: 85 78 77 85  Resp: 16 18 18    Temp:      TempSrc:      Weight:      Height:        No intake/output data recorded. No intake/output data recorded.   FHT:  FHR: 135 bpm, variability: moderate,  accelerations:  Present,  decelerations:  Present mild variable decels with ctx UC:   regular, every 3-6 minutes SVE:   Dilation: 6 Effacement (%): 80 Station: -2 Exam by:: [place], rn  Labs:   Recent Labs    05/21/19 0728  WBC 12.2*  HGB 12.4  HCT 37.1  PLT 329    Assessment / Plan: 28 y.o.[id number] with IUP at [redacted]w[redacted]d by LMPpresenting forIOL for hx of IUFD, GDMA2(NPH insulin), polyhydramnios  Labor: Progressing normally on Pitocin, will continue to titrate per protocol. Preeclampsia:  no signs or symptoms of toxicity and labs stable Fetal Wellbeing:  Category I Pain Control:  Epidural I/D:  GBS positive - GBS antibiotic prophylaxis per protocol Anticipated MOD:  NSVD  [name], CNM, BSN 05/21/2019, 6:52 PM

## 2019-05-21 NOTE — H&P (Addendum)
ADMISSION/ HISTORY & PHYSICAL:  Admission Date: 05/21/2019  7:22 AM  Admit Diagnosis: PREG    Joanna [name] is a 28 y.o. female [phone] with IUP at 80w5dby LMP presenting for IOL for hx of IUFD, GDMA2 (NPH insulin), and IUGR-resolved with EFW 14% on 05/01/2019.  Prenatal History: [id number]  EDC : 05/30/2019, by Last Menstrual Period  [facility] 140w0dPrenatal course complicated by:  Hx of IUFD at [redacted] weeks gestation GDMA2 (NPH insulin) GBS Bacteruria Obesity Mild intermittent asthma - poorly controlled IUGR-resolved with EFW 14% on 05/01/2019  Prenatal Labs: ABO, Rh: A (05/14 1131)  Antibody: NEG (11/03 0749) Rubella: 0.93 (05/14 1131)  RPR: Non Reactive (09/15 0825)  HBsAg: Negative (05/14 1131)  HIV: Non Reactive (09/15 0825)  GBS: Positive/-- (11/03 0000)  1 hr Glucola : Third trimester 132/277/159 Genetic Screening: Declined Ultrasound: Normal anatomy scan with no anomalies noted    Maternal Diabetes: Yes:  Diabetes Type:  Insulin/Medication controlled Genetic Screening: Declined Maternal Ultrasounds/Referrals: IUGR-resolved 10/14 Fetal Ultrasounds or other Referrals:  None Maternal Substance Abuse:  No Significant Maternal Medications:  Meds include: Other: NPH insulin Significant Maternal Lab Results:  Group B Strep positive Other Comments:  None  Medical / Surgical History :  Past medical history:  Past Medical History:  Diagnosis Date  . Asthma    inhaler used 3 months  . Diabetes mellitus without complication (HCLupus  . 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 IUGR (intrauterine growth restriction) affecting care of mother 04/18/2019  . Transient hypertension of pregnancy 04/08/2019  . Urinary tract infection    Pyelonephritis during pregnancy     Past surgical history:  Past Surgical History:  Procedure Laterality Date  . HERNIA REPAIR       Family History:  Family History  Problem Relation Age of Onset  .  Kidney disease Mother   . Alcohol abuse Neg Hx   . Arthritis Neg Hx   . Asthma Neg Hx   . Birth defects Neg Hx   . Cancer Neg Hx   . COPD Neg Hx   . Depression Neg Hx   . Drug abuse Neg Hx   . Diabetes Neg Hx   . Early death Neg Hx   . Hearing loss Neg Hx   . Heart disease Neg Hx   . Hyperlipidemia Neg Hx   . Hypertension Neg Hx   . Learning disabilities Neg Hx   . Mental illness Neg Hx   . Mental retardation Neg Hx   . Miscarriages / Stillbirths Neg Hx   . Stroke Neg Hx   . Vision loss Neg Hx   . Varicose Veins Neg Hx      Social History:  reports that she has been smoking cigarettes. She has a 1.00 pack-year smoking history. She has never used smokeless tobacco. She reports that she does not drink alcohol or use drugs.   Allergies: Patient has no known allergies.   Current Medications at time of admission:  Medications Prior to Admission  Medication Sig Dispense Refill Last Dose  . Accu-Chek FastClix Lancets MISC 1 Device by Percutaneous route 4 (four) times daily. 100 each 12   . albuterol (VENTOLIN HFA) 108 (90 Base) MCG/ACT inhaler Inhale 2 puffs into the lungs every 4 (four) hours as needed for wheezing or shortness of breath. 18 g 4   . AMBULATORY NON FORMULARY MEDICATION 1 Device by Other route once a week. Blood pressure  cuff/medium/monitored regularly at home/ICD 10: HROB O09.90 1 kit 0   . aspirin EC 81 MG tablet Take 1 tablet (81 mg total) by mouth daily. Take after 12 weeks for prevention of preeclampsia later in pregnancy 300 tablet 2   . ferrous sulfate 325 (65 FE) MG tablet Take 1 tablet (325 mg total) by mouth 2 (two) times daily with a meal. 60 tablet 1   . fluticasone (FLOVENT HFA) 44 MCG/ACT inhaler Inhale 2 puffs into the lungs 2 (two) times daily. 1 Inhaler 12   . glucose blood (ACCU-CHEK AVIVA PLUS) test strip Use as instructed QID 100 each 12   . insulin NPH Human (HUMULIN N) 100 UNIT/ML injection Inject 22 units in the morning and 28 units in the  evening 10 mL 3   . Insulin Syringes, Disposable, U-100 0.5 ML MISC 1 each by Does not apply route 2 (two) times daily with a meal. 100 each 1   . Prenatal Vit-Fe Fumarate-FA (PRENATAL VITAMIN) 27-0.8 MG TABS Take 1 tablet by mouth daily. 90 tablet 3      Review of Systems: Denies HA, RUQ pain, visual disturbances. Denies nausea, vomiting, abdominal pain. Denies LOF, vaginal discharge, vaginal bleeding, decreased fetal movement.   Physical Exam: Vital signs and nursing notes reviewed.  ED Triage Vitals  Enc Vitals Group     BP 05/21/19 0746 119/69     Pulse Rate 05/21/19 0746 95     Resp 05/21/19 0746 18     Temp 05/21/19 0746 98.8 F (37.1 C)     Temp Source 05/21/19 0746 Oral     SpO2 --      Weight 05/21/19 0757 235 lb 4.8 oz (106.7 kg)     Height 05/21/19 0757 '5\' 4"'$  (1.626 m)     Head Circumference --      Peak Flow --      Pain Score --      Pain Loc --      Pain Edu? --      Excl. in [place]? --      General: AAO x 3, NAD, coping well. Denies feeling any ctx at this time. Heart: RRR Lungs:CTAB Abdomen: Gravid, NT Extremities: No edema Genitalia / VE: Dilation: 3.5 Effacement (%): 50 Cervical Position: Posterior Station: -2 Presentation: Vertex Exam by:: [place], rn   FHR: 135 BPM, moderate variability, present accels, no decels TOCO: Ctx - none  Labs:   Pending T&S, CBC, RPR  Recent Labs    05/21/19 0728  WBC 12.2*  HGB 12.4  HCT 37.1  PLT 329       Assessment: Joanna [name] is a 28 y.o. female [phone] with IUP at 37w5dby LMP presenting for IOL for hx of IUFD, GDMA2 (NPH insulin), polyhydramnios, and IUGR with EFW 14% on 05/01/2019.  Not in labor FHR category 1 GBS postive Desires active management of labor Breastfeeding planned Placenta disposal - to L&D to be discarded following delivery  Plan:  Admit to BS Routine L&D orders Analgesia/anesthesia PRN - planning for epidural  Labor induction with Pitocin - plan for AROM CBG q2h, initiate  glucose stabilizer protocol with CBG>120  Rubella non-immune - plan for immunization pospartum GBS positive - GBS antibiotic prophylaxis per protocol Contraception postpartum - undecided at this time Anticipate NSVB   Dr. SNehemiah Settlenotified of admission / plan of care   BJuanna CaoSNM, BSN 05/21/2019, 8:53 AM   CNM attestation:  I have seen and examined this patient; I  agree with above documentation in the student nurse midwife's note.   MARKEYA MINCY is a 28 y.o. [phone] here for IOL due to hx IUFD, also with GDMA2 insulin-controlled (NPH 22/28), and resolved both FGR & polyhydramnios  PE: BP 116/69   Pulse 84   Temp 98.7 F (37.1 C) (Oral)   Resp 16   Ht '5\' 4"'$  (1.626 m)   Wt 106.7 kg   LMP 08/23/2018 (Approximate)   BMI 40.39 kg/m  Gen: calm comfortable, NAD Resp: normal effort, no distress Abd: gravid  ROS, labs, PMH reviewed  Plan: Admit to Labor and Delivery Plan Pitocin as IOL method Check CBGs q 2h- glucostabilizer as needed for results >120 Anticipate vag del Plan for MMR pp  Myrtis Ser CNM 05/21/2019, 4:00 PM

## 2019-05-22 ENCOUNTER — Other Ambulatory Visit: Payer: Medicaid Other

## 2019-05-22 ENCOUNTER — Encounter: Payer: Medicaid Other | Admitting: Family Medicine

## 2019-05-22 LAB — GLUCOSE, RANDOM: Glucose, Bld: 97 mg/dL (ref 70–99)

## 2019-05-22 MED ORDER — OXYCODONE HCL 5 MG PO TABS
5.0000 mg | ORAL_TABLET | ORAL | Status: DC | PRN
  Administered 2019-05-22 – 2019-05-23 (×2): 5 mg via ORAL
  Filled 2019-05-22 (×2): qty 1

## 2019-05-22 MED ORDER — PNEUMOCOCCAL VAC POLYVALENT 25 MCG/0.5ML IJ INJ
0.5000 mL | INJECTION | INTRAMUSCULAR | Status: AC
  Administered 2019-05-23: 09:00:00 0.5 mL via INTRAMUSCULAR
  Filled 2019-05-22: qty 0.5

## 2019-05-22 NOTE — Anesthesia Postprocedure Evaluation (Signed)
Anesthesia Post Note  Patient: Joanna [name]  Procedure(s) Performed: AN AD HOC LABOR EPIDURAL     Patient location during evaluation: Mother Baby Anesthesia Type: Epidural Level of consciousness: awake Pain management: satisfactory to patient Vital Signs Assessment: post-procedure vital signs reviewed and stable Respiratory status: spontaneous breathing Cardiovascular status: stable Anesthetic complications: no    Last Vitals:  Vitals:   05/22/19 0330 05/22/19 0730  BP: (!) 107/52 121/74  Pulse: 72 83  Resp: 20 18  Temp: 36.9 C 37.1 C  SpO2:      Last Pain:  Vitals:   05/22/19 0730  TempSrc: Oral  PainSc:    Pain Goal:                   [employer]

## 2019-05-22 NOTE — Progress Notes (Signed)
Mom told NT that she wanted to go home because she was alone and needed her family.  Patient had already discussed with [facility] the desire to go home today.  The pediatrician told Mom baby was not going home til 11/5.  Mom told NT she did not want her vital signs done.

## 2019-05-22 NOTE — Progress Notes (Signed)
Post Partum Day 1 Subjective: no complaints, up ad lib, voiding, tolerating PO and + flatus  Objective: Blood pressure (!) 107/52, pulse 72, temperature 98.5 F (36.9 C), temperature source Oral, resp. rate 20, height 5\' 4"  (1.626 m), weight 106.7 kg, last menstrual period 08/23/2018, SpO2 98 %, unknown if currently breastfeeding.  Physical Exam:  General: alert and no distress Lochia: appropriate Uterine Fundus: firm DVT Evaluation: No cords or calf tenderness. No significant calf/ankle edema.  Recent Labs    05/21/19 0728  HGB 12.4  HCT 37.1    Assessment/Plan: Plan for discharge tomorrow and Breastfeeding. Patient is undecided on contraception but does not want implantable birth control. Circumcision is planned to be completed outpatient.   LOS: 1 day   Dorothyann Peng 05/22/2019, 7:48 AM

## 2019-05-22 NOTE — Lactation Note (Signed)
This note was copied from a baby's chart. Lactation Consultation Note  Patient Name: Boy Marguerita Stapp [id number] Date: 05/22/2019 Reason for consult: Initial assessment;1st time breastfeeding;Early term 37-38.6wks;Infant < 6lbs P5, 11 hour female infant. Mom feeding choice at admission was breastfeeding but mom's current choice is breast and formula. Mom receives [facility] in [facility] and mom doesn't have breast pump at home. Tools given: breast shells, hand pump and  DEBP due to being short shafted and when breast are stimulated they become flat.  Per mom, infant has been spitting up Similac Neosure 22 kcal formula had 4 episodes  or would not drink it.  LC changed a void diaper while in room, infant had 2 voids and 2 stools since birth. Per mom, this will be her first time latching infant to breast, and she did not breastfeed her other 4 children. Infant was cuing and fussy mom taught back hand expression and infant was given 1 ml of colostrum by spoon which he did not spit up. [place] notice mom has short shaft nipples which becomes flat when compressed. LC assisted mom in using harmony hand pump to pre-pump breast prior to latching infant which helped everted nipple shaft out more. Mom latched infant on left breast using cross cradle hold, infant latched with wide mouth, nose and chin touching breast and top lip flanged out, infant sustained latch throughout feeding and breastfed for 10 minutes. Mom became very sleep and LC had to wake mom during feeding. Infant was supplemented with 6 ml of Similac Neosure 22 kcal with iron by using a spoon, infant took volume and did not spit up formula. LC burped infant and swaddle him in blanket. Mom wanted to use DEBP, LC explained how to use and mom pumped for 15 minutes both breast on initial setting. Mom shown how to use DEBP & how to disassemble, clean, & reassemble parts. Mom was given breast shells to wear in bra to help evert nipple shaft out more mom  understands not to sleep in them at night. Mom knows to breastfeed infant according hunger cues, 8 to 12 times within 24 hours and on demand. Mom will continue to do STS as much as possible.  Mom knows to call Nurse or [place] if she has any questions, concerns or need assistance with latching infant to breast.  Reviewed Baby & Me book's Breastfeeding Basics.  Mom made aware of O/P services, breastfeeding support groups, community resources, and our phone # for post-discharge questions.  Maternal Data Formula Feeding for Exclusion: No Has patient been taught Hand Expression?: Yes(Infant given 1 ml of colostrum by spooon.) Does the patient have breastfeeding experience prior to this delivery?: No  Feeding Feeding Type: Breast Fed  LATCH Score Latch: Grasps breast easily, tongue down, lips flanged, rhythmical sucking.  Audible Swallowing: Spontaneous and intermittent  Type of Nipple: Everted at rest and after stimulation  Comfort (Breast/Nipple): Soft / non-tender  Hold (Positioning): Assistance needed to correctly position infant at breast and maintain latch.  LATCH Score: 9  Interventions Interventions: Breast feeding basics reviewed;Breast compression;Adjust position;Assisted with latch;Support pillows;Skin to skin;Hand pump;DEBP;Breast massage;Hand express;Expressed milk;Pre-pump if needed;Shells;Position options  Lactation Tools Discussed/Used Tools: Shells Shell Type: Other (comment)(short shafted) WIC Program: Yes Pump Review: Setup, frequency, and cleaning;Milk Storage Initiated by:: Vicente Serene, IBCLC Date initiated:: 05/22/19   Consult Status Consult Status: Follow-up Date: 05/22/19 Follow-up type: In-patient    Vicente Serene 05/22/2019, 7:05 AM

## 2019-05-23 MED ORDER — IBUPROFEN 600 MG PO TABS: 600.0000 mg | ORAL_TABLET | Freq: Four times a day (QID) | ORAL | 0 refills | Status: DC | PRN

## 2019-05-23 NOTE — Discharge Instructions (Signed)

## 2019-05-23 NOTE — Lactation Note (Signed)
This note was copied from a baby's chart. Lactation Consultation Note  Patient Name: Joanna [name] [id number] Date: 05/23/2019 Reason for consult: Follow-up assessment;Infant < 6lbs;1st time breastfeeding Baby is 37 hours old/5% weight loss.  Mom states she attempts to put baby to breast but he is not latching.  Mom is not pumping because she is not obtaining milk.  Baby just had formula feeding and is sleeping.  Mom and baby discharged and mom anxious to go home.  Instructed to call for assist if baby wakes prior to discharge.  Reviewed outpatient services and encouraged to cal prn.  Mom has a breast pump at home.  Discussed supply and demand and stressed importance of pumping every 3 hours.  Discussed milk coming to volume and the prevention and treatment of engorgement.  She denies questions.  Maternal Data    Feeding    LATCH Score                   Interventions    Lactation Tools Discussed/Used     Consult Status Consult Status: Complete Follow-up type: Call as needed    Ave Filter 05/23/2019, 9:24 AM

## 2019-05-29 ENCOUNTER — Other Ambulatory Visit: Payer: Medicaid Other

## 2019-05-29 ENCOUNTER — Encounter: Payer: Medicaid Other | Admitting: Obstetrics & Gynecology

## 2019-06-19 ENCOUNTER — Encounter: Payer: Self-pay | Admitting: Advanced Practice Midwife

## 2019-06-19 ENCOUNTER — Telehealth: Payer: Medicaid Other | Admitting: Advanced Practice Midwife

## 2019-06-19 ENCOUNTER — Telehealth: Payer: Self-pay | Admitting: Advanced Practice Midwife

## 2019-06-19 ENCOUNTER — Other Ambulatory Visit: Payer: Self-pay

## 2019-06-19 DIAGNOSIS — Z5329 Procedure and treatment not carried out because of patient's decision for other reasons: Secondary | ICD-10-CM

## 2019-06-19 DIAGNOSIS — Z91199 Patient's noncompliance with other medical treatment and regimen due to unspecified reason: Secondary | ICD-10-CM

## 2019-06-19 NOTE — Telephone Encounter (Signed)
Attempted to call patient to get her rescheduled for her missed postpartum appointment. No answer, left voicemail for the patient to give the office a call back to be rescheduled. No show letter mailed.

## 2019-06-19 NOTE — Progress Notes (Signed)
Sent text invite  @[id number]  Called and went to voicemail  Advised patient that I sent her a link and to log in to that  Link to start her visit. Will attempt to call back in 35mins. @11am  no answer, advised to call the office back to reschedule

## 2019-07-01 ENCOUNTER — Other Ambulatory Visit: Payer: Self-pay | Admitting: *Deleted

## 2019-07-01 DIAGNOSIS — O24429 Gestational diabetes mellitus in childbirth, unspecified control: Secondary | ICD-10-CM

## 2019-07-02 ENCOUNTER — Encounter: Payer: Self-pay | Admitting: General Practice

## 2019-07-04 ENCOUNTER — Encounter: Payer: Self-pay | Admitting: Obstetrics and Gynecology

## 2019-07-04 ENCOUNTER — Other Ambulatory Visit: Payer: Medicaid Other

## 2019-07-10 ENCOUNTER — Other Ambulatory Visit: Payer: Self-pay

## 2019-07-10 ENCOUNTER — Telehealth: Payer: Self-pay | Admitting: Medical

## 2019-07-10 ENCOUNTER — Encounter: Payer: Medicaid Other | Admitting: Medical

## 2019-07-10 ENCOUNTER — Encounter: Payer: Self-pay | Admitting: Medical

## 2019-07-10 NOTE — Telephone Encounter (Signed)
Attempted to contact patient to get her rescheduled for her missed postpartum visit. No answer, left voicemail for patient to give the office a call back to be rescheduled. No show letter mailed.  °

## 2019-07-10 NOTE — Patient Instructions (Signed)
Reschedule your appointment as soon as possible

## 2019-07-10 NOTE — Progress Notes (Signed)
Called pt at 1600 to see if pt would like to start visit early; VM left stating purpose of call.   Called pt at 1619; VM left stating I was calling to check pt in for virtual appt and will call back in 10 minutes.   Called pt at 1632; VM left stating pt will need to reschedule appt.   Apolonio Schneiders RN 07/10/19

## 2019-07-18 ENCOUNTER — Other Ambulatory Visit: Payer: Self-pay | Admitting: *Deleted

## 2019-07-18 NOTE — Progress Notes (Signed)
Entered in error

## 2019-07-22 ENCOUNTER — Other Ambulatory Visit: Payer: Medicaid Other

## 2019-07-30 ENCOUNTER — Other Ambulatory Visit: Payer: Self-pay

## 2019-07-30 ENCOUNTER — Telehealth: Payer: Medicaid Other | Admitting: Nurse Practitioner

## 2019-07-30 DIAGNOSIS — Z5329 Procedure and treatment not carried out because of patient's decision for other reasons: Secondary | ICD-10-CM

## 2019-07-30 DIAGNOSIS — Z91199 Patient's noncompliance with other medical treatment and regimen due to unspecified reason: Secondary | ICD-10-CM

## 2019-07-30 NOTE — Progress Notes (Signed)
@  1057am LVM with appointment information will attempt to call back in 5 mins @1110am  LVM  With appointment information to call back to reschedule her appointment.  Chart reviewed for documentationt. Agree with plan of care.   , NP 07/30/2019 8:48 PM

## 2019-09-04 DIAGNOSIS — Z03818 Encounter for observation for suspected exposure to other biological agents ruled out: Secondary | ICD-10-CM | POA: Diagnosis not present

## 2019-12-10 NOTE — Progress Notes (Signed)
    SUBJECTIVE:   CHIEF COMPLAINT / HPI: Ms. Osland presents to clinic today for physical exam and to discuss birth control.  Nicotine use disorder She reports smoking 9 cigarettes/day for at least the past 10 years.  She is interested in smoking cessation but she realizes it can be difficult.  Birth control She has been hesitant to start hormonal birth control due to significant risk of DVTs concomitant smoking.  She is interested in birth control but we will not increase her risk of DVTs.  Allergies She reports mild allergy symptoms recently and would like to refill her cetirizine.  Pap smear She is aware that she is due for a Pap smear.  Gluteal lesion/skin tag She reported some irritation from a gluteal skin tag or mildly raised portion in the inside of her gluteal cleft.  Seem to only be a minor inconvenience you did not faint up until the end of the visit.  Axillary dermatitis She reports some dried skin beneath her armpits bilaterally.  She currently uses a dose scented deodorant.  She is concerned this may be a flare of her eczema.  She is wondering what she can do to improve this dryness.  PERTINENT  PMH / PSH: History of GDM, current tobacco use, mild asthma  OBJECTIVE:   BP 116/74   Pulse 84   Ht 5\' 4"  (1.626 m)   Wt 219 lb (99.3 kg)   LMP 11/11/2019   SpO2 97%   Breastfeeding No   BMI 37.59 kg/m   General: Alert and cooperative and appears to be in no acute distress Cardio: Normal S1 and S2, no S3 or S4. Rhythm is regular. No murmurs or rubs.   Pulm: Clear to auscultation bilaterally, no crackles, wheezing, or diminished breath sounds. Normal respiratory effort Abdomen: Bowel sounds normal. Abdomen soft and non-tender.  Extremities: No peripheral edema. Warm/ well perfused.  Strong radial pulse.  ASSESSMENT/PLAN:   TOBACCO ABUSE She is interested in smoking cessation.  She is interested in starting Chantix today. -Chantix 1 month starter pack ordered -Quit  date set for 2 weeks from today -Follow-up in 2 months  Prediabetes A1c in the prediabetic range today.  We discussed that this is at an increased risk of progressing to diabetes.  We discussed appropriate exercise and nutrition to reduce her risk of progression to diabetes. -Myplate handout provided  Mild intermittent asthma No significant symptoms at this time. -Albuterol refilled  Contraception management We discussed that ParaGard might be a convenient option for her because of this it is a hormone free version of contraception which can be safely taken despite smoking.  This form birth control is also easily reversible.  She is interested in having a ParaGard placed. -Plan to return to call the clinic for placement of ParaGard  Dry skin dermatitis The distribution of the dryness is isolated to her axilla bilaterally which is not the most common location for an eczematous rash.  Additionally, does not seem to be causing any itching.  She was encouraged to try it switching to an unscented deodorant for now.  If that does not seem to improve her dryness, we could consider a steroid cream.   Gluteal lesion She was encouraged to return to clinic for a Pap smear at which time be convenient to assess any gluteal lesion.  11/13/2019, MD [facility]

## 2019-12-11 ENCOUNTER — Ambulatory Visit (INDEPENDENT_AMBULATORY_CARE_PROVIDER_SITE_OTHER): Payer: Medicaid Other | Admitting: Family Medicine

## 2019-12-11 ENCOUNTER — Encounter: Payer: Self-pay | Admitting: Family Medicine

## 2019-12-11 ENCOUNTER — Other Ambulatory Visit: Payer: Self-pay

## 2019-12-11 VITALS — BP 116/74 | HR 84 | Ht 64.0 in | Wt 219.0 lb

## 2019-12-11 DIAGNOSIS — Z8632 Personal history of gestational diabetes: Secondary | ICD-10-CM

## 2019-12-11 DIAGNOSIS — Z0000 Encounter for general adult medical examination without abnormal findings: Secondary | ICD-10-CM | POA: Diagnosis not present

## 2019-12-11 DIAGNOSIS — R7303 Prediabetes: Secondary | ICD-10-CM | POA: Diagnosis not present

## 2019-12-11 DIAGNOSIS — Z309 Encounter for contraceptive management, unspecified: Secondary | ICD-10-CM

## 2019-12-11 DIAGNOSIS — J4520 Mild intermittent asthma, uncomplicated: Secondary | ICD-10-CM | POA: Diagnosis not present

## 2019-12-11 DIAGNOSIS — F17200 Nicotine dependence, unspecified, uncomplicated: Secondary | ICD-10-CM | POA: Diagnosis not present

## 2019-12-11 DIAGNOSIS — L853 Xerosis cutis: Secondary | ICD-10-CM | POA: Diagnosis not present

## 2019-12-11 LAB — POCT GLYCOSYLATED HEMOGLOBIN (HGB A1C): HbA1c, POC (controlled diabetic range): 5.9 % (ref 0.0–7.0)

## 2019-12-11 MED ORDER — ALBUTEROL SULFATE HFA 108 (90 BASE) MCG/ACT IN AERS: 2.0000 | INHALATION_SPRAY | Freq: Four times a day (QID) | RESPIRATORY_TRACT | 0 refills | Status: DC | PRN

## 2019-12-11 MED ORDER — CHANTIX STARTING MONTH PAK 0.5 MG X 11 & 1 MG X 42 PO TABS: ORAL_TABLET | ORAL | 0 refills | Status: DC

## 2019-12-11 MED ORDER — CETIRIZINE HCL 10 MG PO TABS: 10.0000 mg | ORAL_TABLET | Freq: Every day | ORAL | 11 refills | Status: DC

## 2019-12-11 NOTE — Addendum Note (Signed)
Addended by: Janit Pagan T on: 12/11/2019 01:54 PM   Modules accepted: Level of Service

## 2019-12-11 NOTE — Assessment & Plan Note (Signed)
A1c in the prediabetic range today.  We discussed that this is at an increased risk of progressing to diabetes.  We discussed appropriate exercise and nutrition to reduce her risk of progression to diabetes. -Myplate handout provided

## 2019-12-11 NOTE — Assessment & Plan Note (Signed)
We discussed that ParaGard might be a convenient option for her because of this it is a hormone free version of contraception which can be safely taken despite smoking.  This form birth control is also easily reversible.  She is interested in having a ParaGard placed. -Plan to return to call the clinic for placement of ParaGard

## 2019-12-11 NOTE — Assessment & Plan Note (Signed)
No significant symptoms at this time. -Albuterol refilled

## 2019-12-11 NOTE — Assessment & Plan Note (Signed)
The distribution of the dryness is isolated to her axilla bilaterally which is not the most common location for an eczematous rash.  Additionally, does not seem to be causing any itching.  She was encouraged to try it switching to an unscented deodorant for now.  If that does not seem to improve her dryness, we could consider a steroid cream.

## 2019-12-11 NOTE — Patient Instructions (Addendum)
Asthma: I have refilled your albuterol.  Please try picking up this albuterol and see if this is covered by Medicaid.  Pap smear: Please come back to clinic at your convenience to have a Pap smear performed.  We can also take a look at the gluteal lesion you are concerned about.  Smoking cessation: We will going to start you on Chantix today.  I recommend setting a stop date for smoking in about 2 weeks.  Lets follow-up in 2 months to see how you are doing with this.  Birth control: I think a ParaGard IUD would be a good option for you.  We will get you set up with an appointment in our gynecology clinic to have the ParaGard IUD placed.  Prediabetes: Please pay careful attention to your nutrition and exercise.  If you continue with poor nutrition and exercise you have some risk of progressing to diabetes. MyPlate from USDA  MyPlate is an outline of a general healthy diet based on the 2010 Dietary Guidelines for Americans, from the U.S. Department of Agriculture Architect). It sets guidelines for how To follow MyPlate recommendations:  Eat a wide variety of fruits and vegetables, grains, and protein foods.  Serve smaller portions and eat less food throughout the day.  Limit portion sizes to avoid overeating.  Enjoy your food.  Get at least 150 minutes of exercise every week. This is about 30 minutes each day, 5 or more days per week. It can be difficult to have every meal look like MyPlate. Think about MyPlate as eating guidelines for an entire day, rather than each individual meal. Fruits and vegetables  Make half of your plate fruits and vegetables.  Eat many different colors of fruits and vegetables each day.  For a 2,000 calorie daily food plan, eat: ? 2 cups of vegetables every day. ? 2 cups of fruit every day.  1 cup is equal to: ? 1 cup raw or cooked vegetables. ? 1 cup raw fruit. ? 1 medium-sized orange, apple, or banana. ? 1 cup 100% fruit or vegetable juice. ? 2 cups raw  leafy greens, such as lettuce, spinach, or kale. ?  cup dried fruit. Grains  One fourth of your plate should be grains.  Make at least half of the grains you eat each day whole grains.  For a 2,000 calorie daily food plan, eat 6 oz of grains every day.  1 oz is equal to: ? 1 slice bread. ? 1 cup cereal. ?  cup cooked rice, cereal, or pasta. Protein  One fourth of your plate should be protein.  Eat a wide variety of protein foods, including meat, poultry, fish, eggs, beans, nuts, and tofu.  For a 2,000 calorie daily food plan, eat 5 oz of protein every day.  1 oz is equal to: ? 1 oz meat, poultry, or fish. ?  cup cooked beans. ? 1 egg. ?  oz nuts or seeds. ? 1 Tbsp peanut butter. Dairy  Drink fat-free or low-fat (1%) milk.  Eat or drink dairy as a side to meals.  For a 2,000 calorie daily food plan, eat or drink 3 cups of dairy every day.  1 cup is equal to: ? 1 cup milk, yogurt, cottage cheese, or soy milk (soy beverage). ? 2 oz processed cheese. ? 1 oz natural cheese. Fats, oils, salt, and sugars  Only small amounts of oils are recommended.  Avoid foods that are high in calories and low in nutritional value (empty calories), like  foods high in fat or added sugars.  Choose foods that are low in salt (sodium). Choose foods that have less than 140 milligrams (mg) of sodium per serving.  Drink water instead of sugary drinks. Drink enough water each day to keep your urine pale yellow. Where to find support  Work with your health care provider or a nutrition specialist (dietitian) to develop a customized eating plan that is right for you.  Download an app (mobile application) to help you track your daily food intake. Where to find more information  Go to [email or web address] for more information. Summary  MyPlate is a general guideline for healthy eating from the USDA. It is based on the 2010 Dietary Guidelines for Americans.  In general, fruits and  vegetables should take up  of your plate, grains should take up  of your plate, and protein should take up  of your plate. This information is not intended to replace advice given to you by your health care provider. Make sure you discuss any questions you have with your health care provider. Document Revised: 12/06/2018 Document Reviewed: 10/03/2016 Elsevier Patient Education  [place].

## 2019-12-11 NOTE — Assessment & Plan Note (Signed)
She is interested in smoking cessation.  She is interested in starting Chantix today. -Chantix 1 month starter pack ordered -Quit date set for 2 weeks from today -Follow-up in 2 months

## 2019-12-17 ENCOUNTER — Ambulatory Visit: Payer: Medicaid Other

## 2019-12-19 ENCOUNTER — Ambulatory Visit: Payer: Medicaid Other

## 2020-01-08 DIAGNOSIS — H00019 Hordeolum externum unspecified eye, unspecified eyelid: Secondary | ICD-10-CM | POA: Diagnosis not present

## 2020-01-18 ENCOUNTER — Other Ambulatory Visit: Payer: Self-pay | Admitting: Family Medicine

## 2020-01-21 ENCOUNTER — Other Ambulatory Visit: Payer: Self-pay | Admitting: Family Medicine

## 2020-01-21 MED ORDER — VARENICLINE TARTRATE 1 MG PO TABS: 1.0000 mg | ORAL_TABLET | Freq: Two times a day (BID) | ORAL | 0 refills | Status: DC

## 2020-02-10 ENCOUNTER — Ambulatory Visit: Payer: Medicaid Other

## 2020-02-25 ENCOUNTER — Telehealth: Payer: Self-pay | Admitting: Family Medicine

## 2020-02-25 NOTE — Telephone Encounter (Signed)
Clinical info completed on Work form.  Place form in PCP's box for completion.  Aquilla Solian, CMA

## 2020-02-25 NOTE — Telephone Encounter (Signed)
Patient dropped off forms to be completed by the doctor for work.  Last DOS: 12/11/2019 Patient would like to pick up forms when completed Number: [phone]

## 2020-03-05 NOTE — Telephone Encounter (Signed)
Patient informed of form ready for pick up.  °

## 2020-03-13 DIAGNOSIS — H04132 Lacrimal cyst, left lacrimal gland: Secondary | ICD-10-CM | POA: Diagnosis not present

## 2020-03-21 DIAGNOSIS — J069 Acute upper respiratory infection, unspecified: Secondary | ICD-10-CM | POA: Diagnosis not present

## 2020-03-21 DIAGNOSIS — Z20828 Contact with and (suspected) exposure to other viral communicable diseases: Secondary | ICD-10-CM | POA: Diagnosis not present

## 2020-03-21 DIAGNOSIS — J45909 Unspecified asthma, uncomplicated: Secondary | ICD-10-CM | POA: Diagnosis not present

## 2020-03-21 DIAGNOSIS — R05 Cough: Secondary | ICD-10-CM | POA: Diagnosis not present

## 2020-04-11 DIAGNOSIS — H00026 Hordeolum internum left eye, unspecified eyelid: Secondary | ICD-10-CM | POA: Diagnosis not present

## 2020-08-24 DIAGNOSIS — F439 Reaction to severe stress, unspecified: Secondary | ICD-10-CM | POA: Diagnosis not present

## 2021-08-30 IMAGING — US US MFM OB FOLLOW-UP
1 series · 13 of 28 positions shown · non-contrast
Comparison: none

[Series 1: us mfm ob follow-up · 64 acquisitions, 13 frames shown]
[im 3/64]
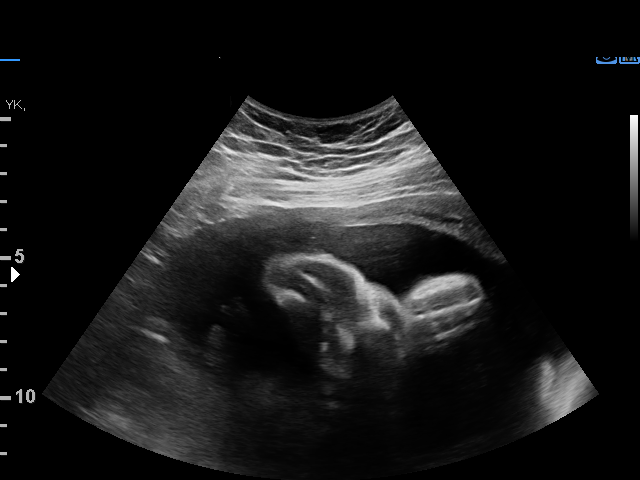
[im 8/64]
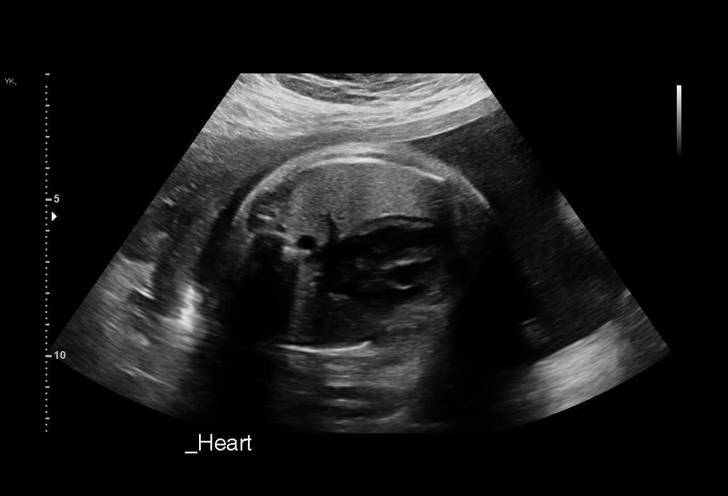
[im 12/64]
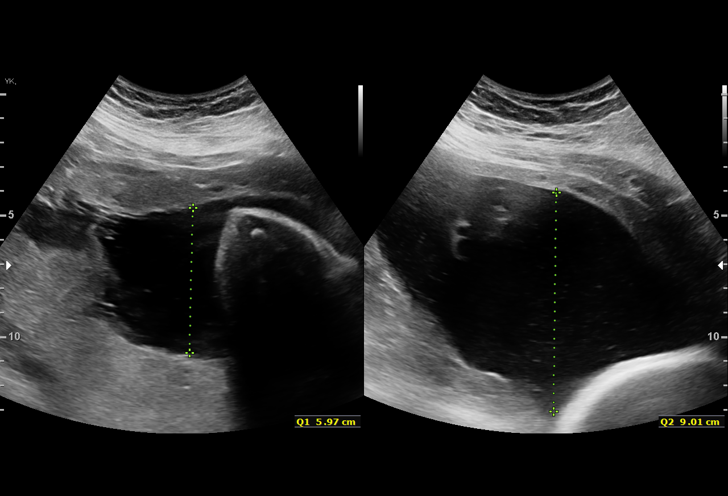
[im 17/64]
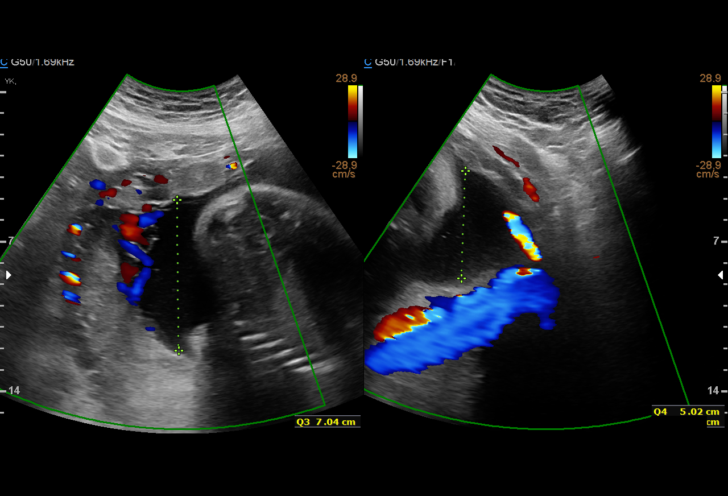
[im 22/64]
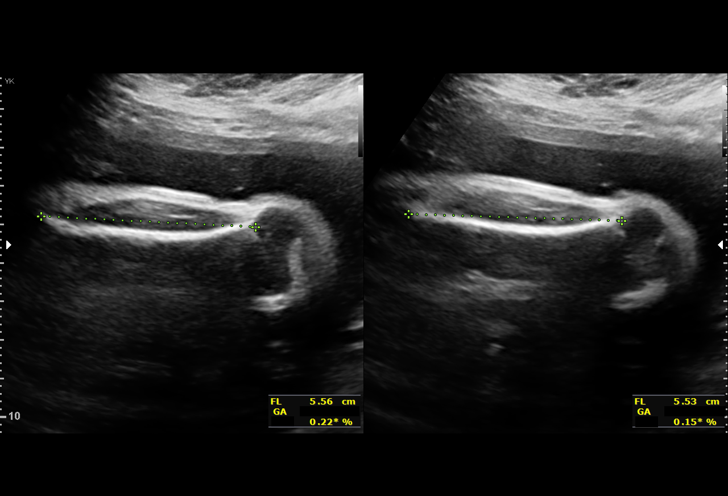
[im 26/64]
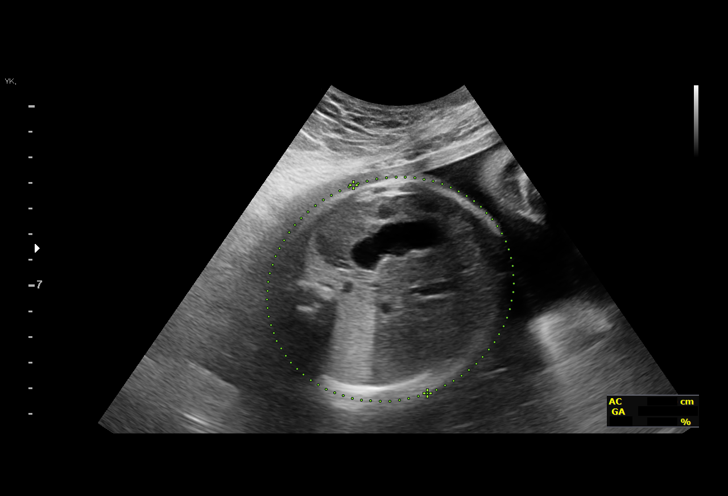
[im 33/64]
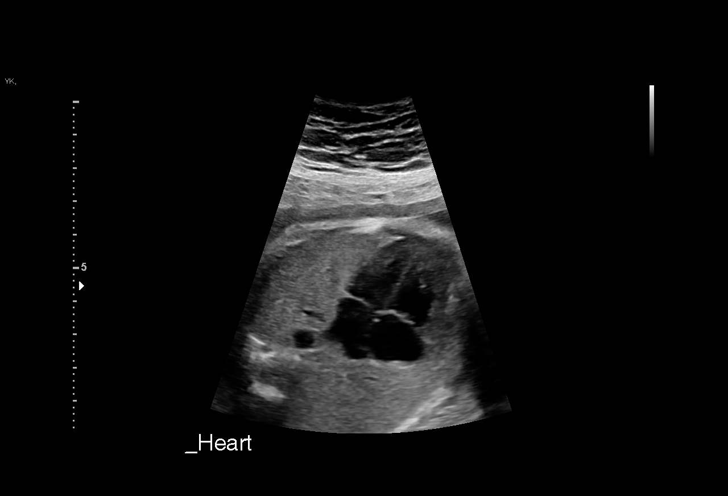
[im 38/64]
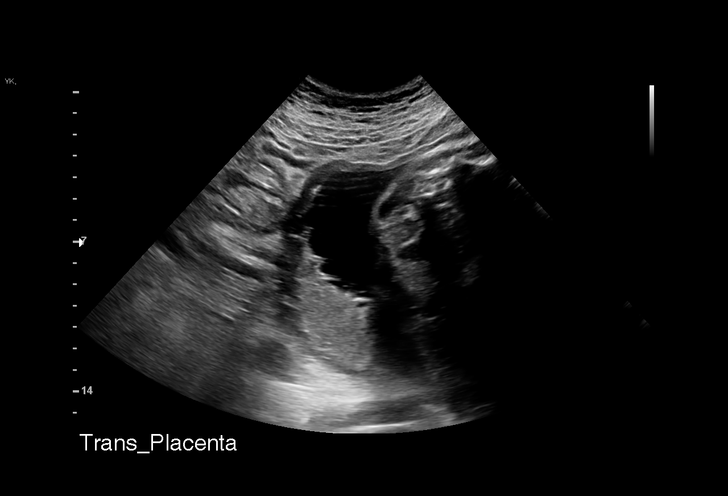
[im 43/64]
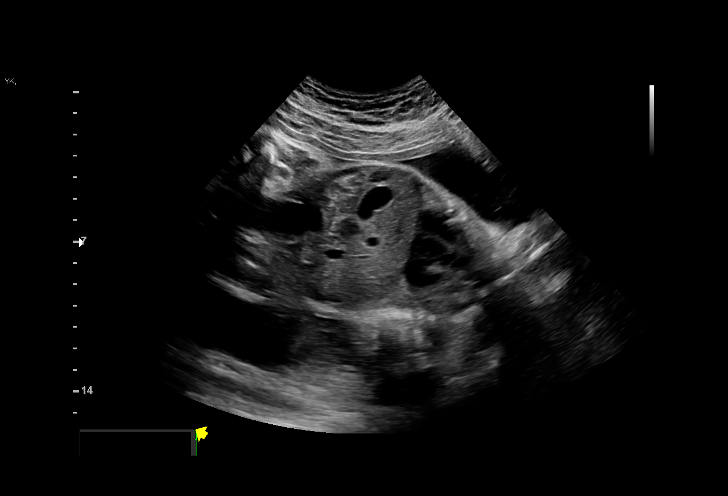
[im 47/64]
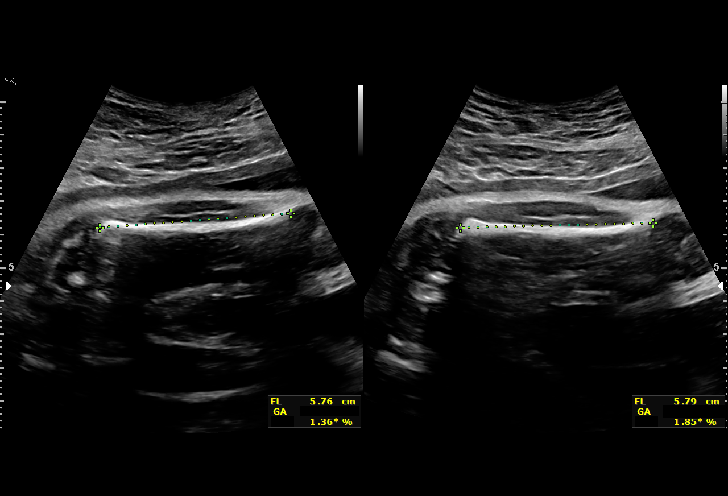
[im 52/64]
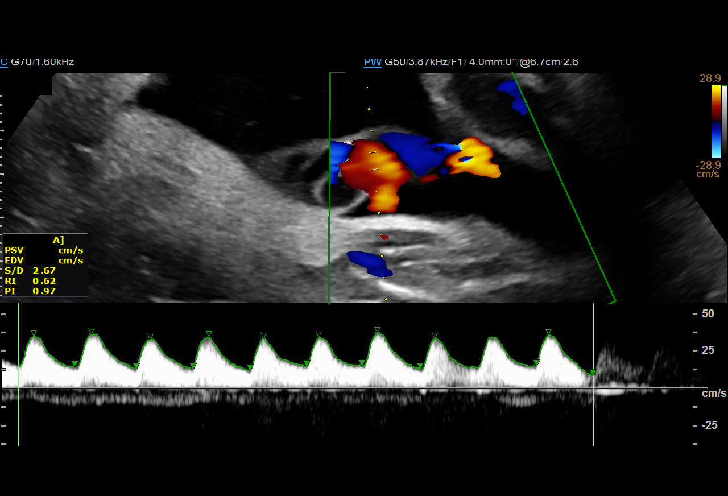
[im 57/64]
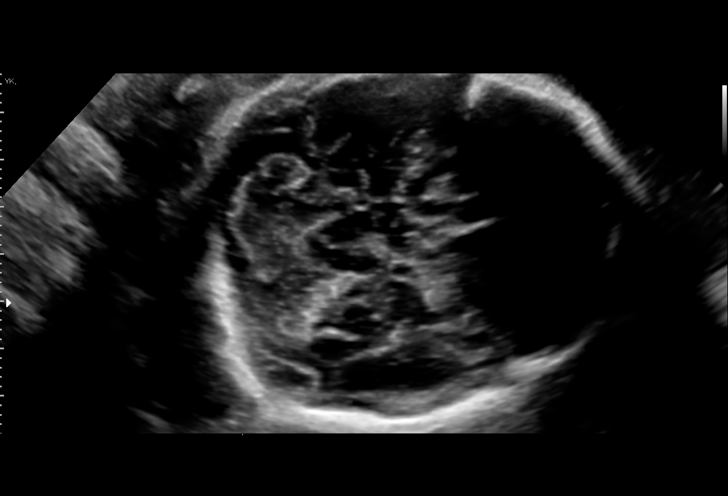
[im 61/64]
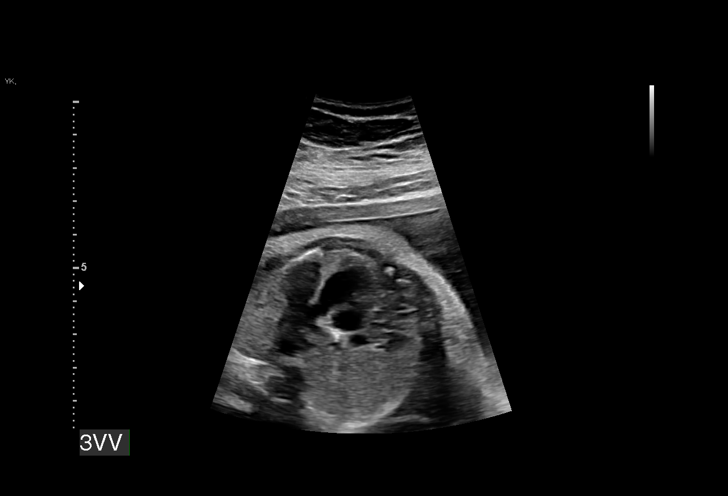

[13 of 28 positions shown; findings below may reference images not displayed]

STRESS                                            TRIPTI
  2  US MFM UA CORD DOPPLER               76820.02     ROJA
                                                       TRIPTI
                                                       TRIPTI
 ----------------------------------------------------------------------

 ----------------------------------------------------------------------
Indications

  Poor obstetric history: Previous IUFD
  (stillbirth)
  Polyhydramnios, third trimester, antepartum
  condition or complication
  Obesity complicating pregnancy, third
  trimester
  Gestational diabetes in pregnancy, diet
  controlled
  Tobacco use complicating pregnancy, third
  trimester
  32 weeks gestation of pregnancy
 ----------------------------------------------------------------------
Vital Signs

 BMI:
Fetal Evaluation

 Num Of Fetuses:         1
 Fetal Heart Rate(bpm):  146
 Cardiac Activity:       Observed
 Presentation:           Cephalic
 Placenta:               Posterior
 P. Cord Insertion:      Previously Visualized
 Amniotic Fluid
 AFI FV:      Polyhydramnios

 AFI Sum(cm)     %Tile       Largest Pocket(cm)
 28.18           > 97

 RUQ(cm)       RLQ(cm)       LUQ(cm)        LLQ(cm)

Biophysical Evaluation

 Amniotic F.V:   Within normal limits       F. Tone:        Observed
 F. Movement:    Observed                   Score:          [DATE]
 F. Breathing:   Observed
Biometry

 BPD:      80.1  mm     G. Age:  32w 1d         27  %    CI:        71.35   %    70 - 86
                                                         FL/HC:      19.1   %    19.9 -
 HC:       302   mm     G. Age:  33w 4d         33  %    HC/AC:      1.09        0.96 -
 AC:      278.1  mm     G. Age:  31w 6d         26  %    FL/BPD:     72.0   %    71 - 87
 FL:       57.7  mm     G. Age:  30w 1d          2  %    FL/AC:      20.7   %    20 - 24

 Est. FW:    3848  gm    3 lb 15 oz      12  %
OB History

 Gravidity:    6         Term:   4        Prem:   1        SAB:   0
 TOP:          0       Ectopic:  0        Living: 4
Gestational Age

 LMP:           32w 5d        Date:  08/23/18                 EDD:   05/30/19
 U/S Today:     32w 0d                                        EDD:   06/04/19
 Best:          32w 5d     Det. By:  LMP  (08/23/18)          EDD:   05/30/19
Anatomy

 Cranium:               Appears normal         Aortic Arch:            Previously seen
 Cavum:                 Appears normal         Ductal Arch:            Previously seen
 Ventricles:            Appears normal         Diaphragm:              Appears normal
 Choroid Plexus:        Previously seen        Stomach:                Appears normal, left
                                                                       sided
 Cerebellum:            Appears normal         Abdomen:                Appears normal
 Posterior Fossa:       Appears normal         Abdominal Wall:         Previously seen
 Nuchal Fold:           Not applicable (>20    Cord Vessels:           Appears normal (3
                        wks GA)                                        vessel cord)
 Face:                  Orbits and profile     Kidneys:                Appear normal
                        previously seen
 Lips:                  Appears normal         Bladder:                Appears normal
 Thoracic:              Appears normal         Spine:                  Previously seen
 Heart:                 Appears normal         Upper Extremities:      Previously seen
                        (4CH, axis, and
                        situs)
 RVOT:                  Appears normal         Lower Extremities:      Previously seen
 LVOT:                  Appears normal

 Other:  Fetus appears to be a male.
Doppler - Fetal Vessels
 Umbilical Artery
  S/D     %tile     RI              PI
  2.6       49

Cervix Uterus Adnexa

 Cervix
 Not visualized (advanced GA >64wks)
Impression

 Normal interval growth.
 Prior IUGR (resolved today)
 3K4FV
 Polyhydramnios- I discussed todays finding of mild
 polyhydramnios. We discussed the causes including GDM,
 Diabetes, idiopathic, aneuploidy and other neurologic
 syndromes. At this time we suspect this is related to
 idiopathic polyhydramnios or GDM. She has not begun
 checking her blood sugars yet as she has not recieved her
 glucometer or had teaching.
 Normal UA Dopplers
 Prior history of IUFD at 35 weeks
Recommendations

 Continue weekly testing
 Consider delivery betwen 37-39 weeks given blood sugar
 control and fetal testing.

## 2021-12-21 ENCOUNTER — Encounter: Payer: Self-pay | Admitting: *Deleted

## 2022-08-16 ENCOUNTER — Ambulatory Visit: Payer: Medicaid Other | Admitting: Student

## 2022-08-30 ENCOUNTER — Ambulatory Visit
Admission: EM | Admit: 2022-08-30 | Discharge: 2022-08-30 | Disposition: A | Discharge status: Home / Self Care | Payer: Self-pay

## 2022-08-30 DIAGNOSIS — S61214A Laceration without foreign body of right ring finger without damage to nail, initial encounter: Secondary | ICD-10-CM

## 2022-08-30 DIAGNOSIS — S61212A Laceration without foreign body of right middle finger without damage to nail, initial encounter: Secondary | ICD-10-CM

## 2022-08-30 DIAGNOSIS — M79644 Pain in right finger(s): Secondary | ICD-10-CM

## 2022-08-30 NOTE — ED Provider Notes (Signed)
[facility]  Note:  This document was prepared using Systems analyst and may include unintentional dictation errors.  MRN: [id number] DOB: 1990-11-05  Subjective:   Joanna [name] is a 32 y.o. female presenting for suffering 2 finger lacerations to the third and fourth fingers of the left hand.  Patient mishandled the kitchen knife, ended up cutting the back of her fingers.  Vaccines up-to-date.  No loss of range of motion, sensation.  No current facility-administered medications for this encounter.  Current Outpatient Medications:    albuterol (VENTOLIN HFA) 108 (90 Base) MCG/ACT inhaler, Inhale 2 puffs into the lungs every 4 (four) hours as needed for wheezing or shortness of breath., Disp: 18 g, Rfl: 4   albuterol (VENTOLIN HFA) 108 (90 Base) MCG/ACT inhaler, Inhale 2 puffs into the lungs every 6 (six) hours as needed for wheezing or shortness of breath., Disp: 6.7 g, Rfl: 0   cetirizine (ZYRTEC) 10 MG tablet, Take 1 tablet (10 mg total) by mouth daily., Disp: 30 tablet, Rfl: 11   fluticasone (FLOVENT HFA) 44 MCG/ACT inhaler, Inhale 2 puffs into the lungs 2 (two) times daily., Disp: 1 Inhaler, Rfl: 12   ibuprofen (ADVIL) 600 MG tablet, Take 1 tablet (600 mg total) by mouth every 6 (six) hours as needed., Disp: 30 tablet, Rfl: 0   Prenatal Vit-Fe Fumarate-FA (PRENATAL VITAMIN) 27-0.8 MG TABS, Take 1 tablet by mouth daily., Disp: 90 tablet, Rfl: 3   varenicline (CHANTIX CONTINUING MONTH PAK) 1 MG tablet, Take 1 tablet (1 mg total) by mouth 2 (two) times daily., Disp: 60 tablet, Rfl: 0   No Known Allergies  Past Medical History:  Diagnosis Date   Asthma    inhaler used 3 months   Diabetes mellitus without complication ([place])    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   IUGR (intrauterine growth restriction) affecting care of mother 04/18/2019   Transient hypertension of pregnancy 04/08/2019   Urinary tract infection    Pyelonephritis during pregnancy     Past  Surgical History:  Procedure Laterality Date   HERNIA REPAIR      Family History  Problem Relation Age of Onset   Kidney disease Mother    Alcohol abuse Neg Hx    Arthritis Neg Hx    Asthma Neg Hx    Birth defects Neg Hx    Cancer Neg Hx    COPD Neg Hx    Depression Neg Hx    Drug abuse Neg Hx    Diabetes Neg Hx    Early death Neg Hx    Hearing loss Neg Hx    Heart disease Neg Hx    Hyperlipidemia Neg Hx    Hypertension Neg Hx    Learning disabilities Neg Hx    Mental illness Neg Hx    Mental retardation Neg Hx    Miscarriages / Stillbirths Neg Hx    Stroke Neg Hx    Vision loss Neg Hx    Varicose Veins Neg Hx     Social History   Tobacco Use   Smoking status: Every Day    Packs/day: 0.50    Years: 2.00    Total pack years: 1.00    Types: Cigarettes   Smokeless tobacco: Never  Vaping Use   Vaping Use: Never used  Substance Use Topics   Alcohol use: No   Drug use: No    ROS   Objective:   Vitals: BP 120/72 (BP Location: Right Arm)  Pulse 84   Temp 98.5 F (36.9 C) (Oral)   Resp 20   LMP 08/06/2022   SpO2 97%   Physical Exam Constitutional:      General: She is not in acute distress.    Appearance: Normal appearance. She is well-developed. She is not ill-appearing, toxic-appearing or diaphoretic.  HENT:     Head: Normocephalic and atraumatic.     Nose: Nose normal.     Mouth/Throat:     Mouth: Mucous membranes are moist.  Eyes:     General: No scleral icterus.       Right eye: No discharge.        Left eye: No discharge.     Extraocular Movements: Extraocular movements intact.  Cardiovascular:     Rate and Rhythm: Normal rate.  Pulmonary:     Effort: Pulmonary effort is normal.  Musculoskeletal:       Hands:  Skin:    General: Skin is warm and dry.  Neurological:     General: No focal deficit present.     Mental Status: She is alert and oriented to person, place, and time.  Psychiatric:        Mood and Affect: Mood normal.         Behavior: Behavior normal.    PROCEDURE NOTE: laceration repair Verbal consent obtained from patient.  Local anesthesia with 6cc Lidocaine 1% without epinephrine.  Wound explored for tendon, ligament damage. Wound scrubbed with soap and water and rinsed. Wound closed with #8 5-0 Prolene (simple interrupted) sutures.  Wound cleansed and dressed.   Assessment and Plan :   PDMP not reviewed this encounter.  1. Finger pain, right   2. Laceration of right middle finger without foreign body without damage to nail, initial encounter   3. Laceration of right ring finger without foreign body without damage to nail, initial encounter     Laceration repaired successfully. Wound care reviewed. Recommended Tylenol and/or ibuprofen for pain control. Return-to-clinic precautions discussed, patient verbalized understanding. Otherwise, follow up in 10 days for suture removal. Counseled patient on potential for adverse effects with medications prescribed/recommended today, ER and return-to-clinic precautions discussed, patient verbalized understanding.    Jaynee Eagles, [place] 09/02/22 [phone]

## 2022-08-30 NOTE — ED Triage Notes (Signed)
Pt states she cut middle and ring finger with kitchen knife ~1 hour PTA-NAD-steady gait

## 2022-08-30 NOTE — Discharge Instructions (Signed)

## 2023-05-22 ENCOUNTER — Other Ambulatory Visit: Payer: Self-pay

## 2023-05-22 ENCOUNTER — Ambulatory Visit
Admission: EM | Admit: 2023-05-22 | Discharge: 2023-05-22 | Disposition: A | Discharge status: Home / Self Care | Payer: Self-pay | Attending: Internal Medicine | Admitting: Internal Medicine

## 2023-05-22 ENCOUNTER — Encounter: Payer: Self-pay | Admitting: Emergency Medicine

## 2023-05-22 DIAGNOSIS — B9789 Other viral agents as the cause of diseases classified elsewhere: Secondary | ICD-10-CM | POA: Insufficient documentation

## 2023-05-22 DIAGNOSIS — J4520 Mild intermittent asthma, uncomplicated: Secondary | ICD-10-CM | POA: Insufficient documentation

## 2023-05-22 DIAGNOSIS — J4530 Mild persistent asthma, uncomplicated: Secondary | ICD-10-CM | POA: Insufficient documentation

## 2023-05-22 DIAGNOSIS — J988 Other specified respiratory disorders: Secondary | ICD-10-CM | POA: Insufficient documentation

## 2023-05-22 DIAGNOSIS — Z1152 Encounter for screening for COVID-19: Secondary | ICD-10-CM | POA: Insufficient documentation

## 2023-05-22 MED ORDER — PREDNISONE 20 MG PO TABS: ORAL_TABLET | ORAL | 0 refills | Status: DC

## 2023-05-22 MED ORDER — CETIRIZINE HCL 10 MG PO TABS: 10.0000 mg | ORAL_TABLET | Freq: Every day | ORAL | 0 refills | Status: AC

## 2023-05-22 MED ORDER — ALBUTEROL SULFATE HFA 108 (90 BASE) MCG/ACT IN AERS: 2.0000 | INHALATION_SPRAY | RESPIRATORY_TRACT | 0 refills | Status: DC | PRN

## 2023-05-22 MED ORDER — PROMETHAZINE-DM 6.25-15 MG/5ML PO SYRP: 5.0000 mL | ORAL_SOLUTION | Freq: Three times a day (TID) | ORAL | 0 refills | Status: DC | PRN

## 2023-05-22 NOTE — ED Triage Notes (Signed)
Patient c/o dry cough x 2 days.

## 2023-05-22 NOTE — Discharge Instructions (Signed)
We will notify you of your test results as they arrive and may take between about 24 hours.  I encourage you to sign up for MyChart if you have not already done so as this can be the easiest way for [place] to communicate results to you online or through a phone app.  Generally, we only contact you if it is a positive test result.  In the meantime, if you develop worsening symptoms including fever, chest pain, shortness of breath despite our current treatment plan then please report to the emergency room as this may be a sign of worsening status from possible viral infection.  Otherwise, we will manage this as a viral syndrome. For sore throat or cough try using a honey-based tea. Use 3 teaspoons of honey with juice squeezed from half lemon. Place shaved pieces of ginger into 1/2-1 cup of water and warm over stove top. Then mix the ingredients and repeat every 4 hours as needed. Please take Tylenol 650mg  every 6 hours for aches and pains, fevers. Hydrate very well with at least 2 liters of water. Eat light meals such as soups to replenish electrolytes and soft fruits, veggies. Start an antihistamine like Zyrtec (10mg  daily) for postnasal drainage, sinus congestion.  You can take this together with prednisone and albuterol inhaler.  Use the cough medications as needed.

## 2023-05-22 NOTE — ED Provider Notes (Signed)
[facility]  Note:  This document was prepared using Conservation officer, historic buildings and may include unintentional dictation errors.  MRN: [id number] DOB: 1991/01/19  Subjective:   Joanna [name] is a 32 y.o. female presenting for 2-day history of acute onset persistent coughing fits, dry hacking cough.  Has also had sinus congestion, chest congestion, shortness of breath and wheezing.  Is using her albuterol inhaler with very temporary relief.  Needs a refill.  Patient is working on quitting smoking.  Would like a COVID test.  No current facility-administered medications for this encounter.  Current Outpatient Medications:    albuterol (VENTOLIN HFA) 108 (90 Base) MCG/ACT inhaler, Inhale 2 puffs into the lungs every 4 (four) hours as needed for wheezing or shortness of breath., Disp: 18 g, Rfl: 4   albuterol (VENTOLIN HFA) 108 (90 Base) MCG/ACT inhaler, Inhale 2 puffs into the lungs every 6 (six) hours as needed for wheezing or shortness of breath., Disp: 6.7 g, Rfl: 0   cetirizine (ZYRTEC) 10 MG tablet, Take 1 tablet (10 mg total) by mouth daily., Disp: 30 tablet, Rfl: 11   fluticasone (FLOVENT HFA) 44 MCG/ACT inhaler, Inhale 2 puffs into the lungs 2 (two) times daily., Disp: 1 Inhaler, Rfl: 12   ibuprofen (ADVIL) 600 MG tablet, Take 1 tablet (600 mg total) by mouth every 6 (six) hours as needed., Disp: 30 tablet, Rfl: 0   Prenatal Vit-Fe Fumarate-FA (PRENATAL VITAMIN) 27-0.8 MG TABS, Take 1 tablet by mouth daily., Disp: 90 tablet, Rfl: 3   varenicline (CHANTIX CONTINUING MONTH PAK) 1 MG tablet, Take 1 tablet (1 mg total) by mouth 2 (two) times daily., Disp: 60 tablet, Rfl: 0   No Known Allergies  Past Medical History:  Diagnosis Date   Asthma    inhaler used 3 months   Diabetes mellitus without complication ([facility])    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   IUGR (intrauterine growth restriction) affecting care of mother 04/18/2019   Transient hypertension of pregnancy 04/08/2019    Urinary tract infection    Pyelonephritis during pregnancy     Past Surgical History:  Procedure Laterality Date   HERNIA REPAIR      Family History  Problem Relation Age of Onset   Kidney disease Mother    Alcohol abuse Neg Hx    Arthritis Neg Hx    Asthma Neg Hx    Birth defects Neg Hx    Cancer Neg Hx    COPD Neg Hx    Depression Neg Hx    Drug abuse Neg Hx    Diabetes Neg Hx    Early death Neg Hx    Hearing loss Neg Hx    Heart disease Neg Hx    Hyperlipidemia Neg Hx    Hypertension Neg Hx    Learning disabilities Neg Hx    Mental illness Neg Hx    Mental retardation Neg Hx    Miscarriages / Stillbirths Neg Hx    Stroke Neg Hx    Vision loss Neg Hx    Varicose Veins Neg Hx     Social History   Tobacco Use   Smoking status: Every Day    Current packs/day: 0.50    Average packs/day: 0.5 packs/day for 2.0 years (1.0 ttl pk-yrs)    Types: Cigarettes   Smokeless tobacco: Never  Vaping Use   Vaping status: Never Used  Substance Use Topics   Alcohol use: No   Drug use: No    ROS  Objective:   Vitals: BP (!) 107/53 (BP Location: Right Arm)   Pulse (!) 101   Temp 100.1 F (37.8 C) (Oral)   Resp 16   LMP 04/25/2023 (Approximate)   SpO2 94%   Physical Exam Constitutional:      General: She is not in acute distress.    Appearance: Normal appearance. She is well-developed and normal weight. She is not ill-appearing, toxic-appearing or diaphoretic.  HENT:     Head: Normocephalic and atraumatic.     Right Ear: Tympanic membrane, ear canal and external ear normal. No drainage or tenderness. No middle ear effusion. There is no impacted cerumen. Tympanic membrane is not erythematous or bulging.     Left Ear: Tympanic membrane, ear canal and external ear normal. No drainage or tenderness.  No middle ear effusion. There is no impacted cerumen. Tympanic membrane is not erythematous or bulging.     Nose: Congestion present. No rhinorrhea.     Mouth/Throat:      Mouth: Mucous membranes are moist. No oral lesions.     Pharynx: No pharyngeal swelling, oropharyngeal exudate, posterior oropharyngeal erythema or uvula swelling.     Tonsils: No tonsillar exudate or tonsillar abscesses.  Eyes:     General: No scleral icterus.       Right eye: No discharge.        Left eye: No discharge.     Extraocular Movements: Extraocular movements intact.     Right eye: Normal extraocular motion.     Left eye: Normal extraocular motion.     Conjunctiva/sclera: Conjunctivae normal.  Cardiovascular:     Rate and Rhythm: Normal rate and regular rhythm.     Heart sounds: Normal heart sounds. No murmur heard.    No friction rub. No gallop.  Pulmonary:     Effort: Pulmonary effort is normal. No respiratory distress.     Breath sounds: No stridor. Wheezing (mild, bilateral over mid lung fields) and rhonchi (bilateral) present. No rales.  Chest:     Chest wall: No tenderness.  Musculoskeletal:     Cervical back: Normal range of motion and neck supple.  Lymphadenopathy:     Cervical: No cervical adenopathy.  Skin:    General: Skin is warm and dry.  Neurological:     General: No focal deficit present.     Mental Status: She is alert and oriented to person, place, and time.  Psychiatric:        Mood and Affect: Mood normal.        Behavior: Behavior normal.     Assessment and Plan :   PDMP not reviewed this encounter.  1. Viral respiratory infection   2. Mild persistent asthma, uncomplicated   3. Mild intermittent asthma without complication    Deferred imaging for now.  COVID testing pending.  Recommended a oral prednisone course in the context of her respiratory symptoms, asthma.  Refilled her albuterol inhaler.  Use supportive care otherwise for an acute viral respiratory infection.  Counseled patient on potential for adverse effects with medications prescribed/recommended today, ER and return-to-clinic precautions discussed, patient verbalized  understanding.    Wallis Bamberg, [place] 05/22/23 [id number]

## 2023-05-23 LAB — SARS CORONAVIRUS 2 (TAT 6-24 HRS): SARS Coronavirus 2: NEGATIVE

## 2023-07-09 ENCOUNTER — Emergency Department (HOSPITAL_COMMUNITY): Payer: Self-pay

## 2023-07-09 ENCOUNTER — Other Ambulatory Visit: Payer: Self-pay

## 2023-07-09 ENCOUNTER — Emergency Department (HOSPITAL_COMMUNITY)
Admission: EM | Admit: 2023-07-09 | Discharge: 2023-07-10 | Disposition: A | Discharge status: Home / Self Care | Payer: Self-pay | Attending: Emergency Medicine | Admitting: Emergency Medicine

## 2023-07-09 ENCOUNTER — Encounter (HOSPITAL_COMMUNITY): Payer: Self-pay | Admitting: Emergency Medicine

## 2023-07-09 DIAGNOSIS — R079 Chest pain, unspecified: Secondary | ICD-10-CM

## 2023-07-09 DIAGNOSIS — R7309 Other abnormal glucose: Secondary | ICD-10-CM | POA: Insufficient documentation

## 2023-07-09 DIAGNOSIS — R739 Hyperglycemia, unspecified: Secondary | ICD-10-CM

## 2023-07-09 DIAGNOSIS — R0789 Other chest pain: Secondary | ICD-10-CM | POA: Insufficient documentation

## 2023-07-09 DIAGNOSIS — F17210 Nicotine dependence, cigarettes, uncomplicated: Secondary | ICD-10-CM | POA: Insufficient documentation

## 2023-07-09 DIAGNOSIS — Z20822 Contact with and (suspected) exposure to covid-19: Secondary | ICD-10-CM | POA: Insufficient documentation

## 2023-07-09 LAB — RESP PANEL BY RT-PCR (RSV, FLU A&B, COVID)  RVPGX2
Influenza A by PCR: NEGATIVE
Influenza B by PCR: NEGATIVE
Resp Syncytial Virus by PCR: NEGATIVE
SARS Coronavirus 2 by RT PCR: NEGATIVE

## 2023-07-09 NOTE — ED Triage Notes (Signed)
Pt reports chest pain x 2 days. Reports a cough as well.

## 2023-07-09 NOTE — ED Provider Notes (Signed)
[place] EMERGENCY DEPARTMENT AT [facility] Provider Note   CSN: [id number] Arrival date & time: 07/09/23  1958     History {Add pertinent medical, surgical, social history, OB history to HPI:1} Chief Complaint  Patient presents with   Chest Pain    Joanna [name] is a 32 y.o. female.  The history is provided by the patient.  Chest Pain She had an episode of left sided chest pain at about 5 PM.  Pain was dull and nonradiating and lasted about 1 minute before resolving.  She denies dyspnea, nausea, diaphoresis.  She is a cigarette smoker admitting to smoking half pack of cigarettes a day.  She had a history of gestational diabetes and hypertension but no problems with diabetes or hypertension outside of pregnancy.  There is no history of hyperlipidemia.  There is a positive family history of premature coronary atherosclerosis.  There is no history of recent travel or surgery or cancer and no prior history of DVT.  She is not taking any exogenous estrogens.   Home Medications Prior to Admission medications   Medication Sig Start Date End Date Taking? Authorizing Provider  albuterol (VENTOLIN HFA) 108 (90 Base) MCG/ACT inhaler Inhale 2 puffs into the lungs every 4 (four) hours as needed for wheezing or shortness of breath. 05/22/23   Wallis Bamberg, PA-C  cetirizine (ZYRTEC ALLERGY) 10 MG tablet Take 1 tablet (10 mg total) by mouth daily. 05/22/23   Wallis Bamberg, PA-C  fluticasone (FLOVENT HFA) 44 MCG/ACT inhaler Inhale 2 puffs into the lungs 2 (two) times daily. 04/14/19   Lesly Dukes, MD  ibuprofen (ADVIL) 600 MG tablet Take 1 tablet (600 mg total) by mouth every 6 (six) hours as needed. 05/23/19   Arabella Merles, CNM  predniSONE (DELTASONE) 20 MG tablet Take 2 tablets daily with breakfast. 05/22/23   Wallis Bamberg, PA-C  Prenatal Vit-Fe Fumarate-FA (PRENATAL VITAMIN) 27-0.8 MG TABS Take 1 tablet by mouth daily. 11/14/18   Shon Hale, MD   promethazine-dextromethorphan (PROMETHAZINE-DM) 6.25-15 MG/5ML syrup Take 5 mLs by mouth 3 (three) times daily as needed for cough. 05/22/23   Wallis Bamberg, PA-C  varenicline (CHANTIX CONTINUING MONTH PAK) 1 MG tablet Take 1 tablet (1 mg total) by mouth 2 (two) times daily. 01/21/20   Mirian Mo, MD      Allergies    Patient has no known allergies.    Review of Systems   Review of Systems  Cardiovascular:  Positive for chest pain.  All other systems reviewed and are negative.   Physical Exam Updated Vital Signs BP 117/79 (BP Location: Left Arm)   Pulse 78   Temp 97.8 F (36.6 C) (Oral)   Resp 18   LMP 06/18/2023 (Exact Date)   SpO2 100%  Physical Exam Vitals and nursing note reviewed.   32 year old female, resting comfortably and in no acute distress. Vital signs are normal. Oxygen saturation is 100%, which is normal. Head is normocephalic and atraumatic. PERRLA, EOMI. Oropharynx is clear. Neck is nontender and supple without adenopathy. Lungs are clear without rales, wheezes, or rhonchi. Chest is nontender. Heart has regular rate and rhythm without murmur. Abdomen is soft, flat, nontender. Extremities have no cyanosis or edema, full range of motion is present. Skin is warm and dry without rash. Neurologic: Mental status is normal, moves all extremities equally.  ED Results / Procedures / Treatments   Labs (all labs ordered are listed, but only abnormal results are displayed) Labs Reviewed  RESP PANEL BY RT-PCR (RSV, FLU A&B, COVID)  [id number]  BASIC METABOLIC PANEL  CBC WITH DIFFERENTIAL/PLATELET  TROPONIN I (HIGH SENSITIVITY)    EKG EKG Interpretation Date/Time:  Sunday July 09 2023 20:21:28 EST Ventricular Rate:  90 PR Interval:  153 QRS Duration:  99 QT Interval:  347 QTC Calculation: 425 R Axis:   36  Text Interpretation: duplicate, discard Confirmed by Dione Booze ([id number]) on 07/09/2023 11:20:08 PM  Radiology DG Chest 2 View Result Date:  07/09/2023 CLINICAL DATA:  Chest pain and cough.  Chest pain for 2 days. EXAM: CHEST - 2 VIEW COMPARISON:  04/24/2011 FINDINGS: The heart size and mediastinal contours are within normal limits. Both lungs are clear. The visualized skeletal structures are unremarkable. IMPRESSION: No active cardiopulmonary disease. Electronically Signed   By: Burman Nieves M.D.   On: 07/09/2023 20:34    Procedures Procedures  {Document cardiac monitor, telemetry assessment procedure when appropriate:1}  Medications Ordered in ED Medications - No data to display  ED Course/ Medical Decision Making/ A&P   {   Click here for ABCD2, HEART and other calculatorsREFRESH Note before signing :1}          HEART Score: 1                Geneva (Revised) Score: 10, Geneva Score Interpretation: Moderate Risk Group: ~20-30% incidence of pulmonary embolism from several studies PERC Score: 0, PERC Score Interpretation: No need for further workup, as <2% chance of PE.  If no criteria are positive and clinicians pre-test probability is <15%, PERC Rule criteria are satisfied Medical Decision Making  Transient episode of chest pain, specific etiology is unclear but patient is at very low risk for significant pathology such as ACS, pneumonia, pulmonary embolism.  I have reviewed her electrocardiogram, and my interpretation is normal ECG.  Chest x-ray shows no active cardiopulmonary disease.  I have independently viewed the images, and agree with radiologist's interpretation.  I have reviewed her laboratory tests, and my interpretation is negative PCR for COVID-19, influenza, RSV.  I have ordered basic laboratory evaluation of CBC, basic metabolic panel, single troponin (she is approximately 7 hours post her episode of chest pain, single troponin would be sufficient to rule out ACS).  Heart score is 1, which puts her at low risk for major adverse cardiac events in the next 6 weeks.  Pulmonary embolism screening tools of PERC and  Geneva score are 0, very low risk of pulmonary embolism and no need to screen with D-dimer.  {Document your discussion with family members, caretakers, and with consultants:1} {Document social determinants of health affecting pt's care:1} {Document your decision making why or why not admission, treatments were needed:1} Final Clinical Impression(s) / ED Diagnoses Final diagnoses:  None    Rx / DC Orders ED Discharge Orders     None

## 2023-07-09 NOTE — ED Provider Triage Note (Signed)
Emergency Medicine Provider Triage Evaluation Note  Joanna [name] , a 32 y.o. female  was evaluated in triage.  Pt complains of chest pain, cough.  Review of Systems  Positive: Intermittent CP, cough Negative: SOB, vomiting, fever  Physical Exam  BP 125/87   Pulse 95   Temp 98.7 F (37.1 C) (Oral)   Resp 18   LMP 06/18/2023 (Exact Date)   SpO2 100%  Gen:   Awake, no distress   Resp:  Normal effort  MSK:   Moves extremities without difficulty  Other:    Medical Decision Making  Medically screening exam initiated at 8:20 PM.  Appropriate orders placed.  OLIE TEAT was informed that the remainder of the evaluation will be completed by another provider, this initial triage assessment does not replace that evaluation, and the importance of remaining in the ED until their evaluation is complete.  3-4 days of left chest pain without modifying factors. No SOB, vomiting, fever. Reports having a cough. History of asthma, smoker. Works at a nursing home.    Elpidio Anis, PA-C 07/09/23 2022

## 2023-07-10 LAB — CBC WITH DIFFERENTIAL/PLATELET
Abs Immature Granulocytes: 0.04 10*3/uL (ref 0.00–0.07)
Basophils Absolute: 0 10*3/uL (ref 0.0–0.1)
Basophils Relative: 0 %
Eosinophils Absolute: 0.2 10*3/uL (ref 0.0–0.5)
Eosinophils Relative: 1 %
HCT: 43.9 % (ref 36.0–46.0)
Hemoglobin: 14.7 g/dL (ref 12.0–15.0)
Immature Granulocytes: 0 %
Lymphocytes Relative: 33 %
Lymphs Abs: 4.9 10*3/uL — ABNORMAL HIGH (ref 0.7–4.0)
MCH: 30.1 pg (ref 26.0–34.0)
MCHC: 33.5 g/dL (ref 30.0–36.0)
MCV: 90 fL (ref 80.0–100.0)
Monocytes Absolute: 0.9 10*3/uL (ref 0.1–1.0)
Monocytes Relative: 6 %
Neutro Abs: 8.7 10*3/uL — ABNORMAL HIGH (ref 1.7–7.7)
Neutrophils Relative %: 60 %
Platelets: 297 10*3/uL (ref 150–400)
RBC: 4.88 MIL/uL (ref 3.87–5.11)
RDW: 14.7 % (ref 11.5–15.5)
WBC: 14.7 10*3/uL — ABNORMAL HIGH (ref 4.0–10.5)
nRBC: 0 % (ref 0.0–0.2)

## 2023-07-10 LAB — BASIC METABOLIC PANEL
Anion gap: 10 (ref 5–15)
BUN: 11 mg/dL (ref 6–20)
CO2: 21 mmol/L — ABNORMAL LOW (ref 22–32)
Calcium: 9.3 mg/dL (ref 8.9–10.3)
Chloride: 107 mmol/L (ref 98–111)
Creatinine, Ser: 0.68 mg/dL (ref 0.44–1.00)
GFR, Estimated: >60 mL/min (ref >60)
Glucose, Bld: 114 mg/dL — ABNORMAL HIGH (ref 70–99)
Potassium: 3.5 mmol/L (ref 3.5–5.1)
Sodium: 138 mmol/L (ref 135–145)

## 2023-07-10 LAB — TROPONIN I (HIGH SENSITIVITY): Troponin I (High Sensitivity): <2 ng/L (ref <18)

## 2023-08-26 ENCOUNTER — Ambulatory Visit
Admission: EM | Admit: 2023-08-26 | Discharge: 2023-08-26 | Disposition: A | Discharge status: Home / Self Care | Payer: Self-pay | Attending: Family Medicine | Admitting: Family Medicine

## 2023-08-26 DIAGNOSIS — H00011 Hordeolum externum right upper eyelid: Secondary | ICD-10-CM

## 2023-08-26 MED ORDER — ERYTHROMYCIN 5 MG/GM OP OINT: TOPICAL_OINTMENT | OPHTHALMIC | 0 refills | Status: DC

## 2023-08-26 NOTE — ED Provider Notes (Signed)
 [facility]    CSN: [id number] Arrival date & time: 08/26/23  0808      History   Chief Complaint Chief Complaint  Patient presents with   Stye    HPI Joanna [name] is a 33 y.o. female presents for a stye.  Patient reports 4 days of a right upper eyelid stye that has begun to drain.  States this morning it was pretty swollen causing her to have to call out of work.  States she has had these in the past.  No fevers or chills.  No erythema or warmth.  No history of MRSA.  No OTC medications have been used since onset.  No other concerns at this time.  HPI  Past Medical History:  Diagnosis Date   Asthma    inhaler used 3 months   Diabetes mellitus without complication (HCC)    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   IUGR (intrauterine growth restriction) affecting care of mother 04/18/2019   Transient hypertension of pregnancy 04/08/2019   Urinary tract infection    Pyelonephritis during pregnancy    Patient Active Problem List   Diagnosis Date Noted   Prediabetes 12/11/2019   Contraception management 12/11/2019   Dry skin dermatitis 12/11/2019   Transient hypertension of pregnancy 04/08/2019   Rubella non-immune status, antepartum 12/03/2018   History of IUFD 11/29/2018   TOBACCO ABUSE 10/29/2008   Mild intermittent asthma 09/14/2006   Eczema 09/14/2006    Past Surgical History:  Procedure Laterality Date   HERNIA REPAIR      OB History     Gravida  6   Para  6   Term  5   Preterm  1   AB  0   Living  5      SAB  0   IAB  0   Ectopic  0   Multiple  0   Live Births  5            Home Medications    Prior to Admission medications   Medication Sig Start Date End Date Taking? Authorizing Provider  erythromycin  ophthalmic ointment Place a 1/2 inch ribbon of ointment on the right upper eyelid lash line 3 times a day for 7 days 08/26/23  Yes Jameica Couts, Jodi R, NP  albuterol  (VENTOLIN  HFA) 108 (90 Base) MCG/ACT inhaler Inhale 2 puffs into the lungs every 4  (four) hours as needed for wheezing or shortness of breath. 05/22/23   Christopher Savannah, PA-C  cetirizine  (ZYRTEC  ALLERGY) 10 MG tablet Take 1 tablet (10 mg total) by mouth daily. 05/22/23   Christopher Savannah, PA-C  fluticasone (FLOVENT  HFA) 44 MCG/ACT inhaler Inhale 2 puffs into the lungs 2 (two) times daily. 04/14/19   Cris Burnard DEL, MD  ibuprofen  (ADVIL ) 600 MG tablet Take 1 tablet (600 mg total) by mouth every 6 (six) hours as needed. 05/23/19   Loreli Iha D, CNM  predniSONE  (DELTASONE ) 20 MG tablet Take 2 tablets daily with breakfast. 05/22/23   Christopher Savannah, PA-C  Prenatal Vit-Fe Fumarate-FA (PRENATAL VITAMIN) 27-0.8 MG TABS Take 1 tablet by mouth daily. 11/14/18   Chrystal Lamarr GORMAN, MD  promethazine -dextromethorphan (PROMETHAZINE -DM) 6.25-15 MG/5ML syrup Take 5 mLs by mouth 3 (three) times daily as needed for cough. 05/22/23   Christopher Savannah, PA-C  varenicline  (CHANTIX  CONTINUING MONTH PAK) 1 MG tablet Take 1 tablet (1 mg total) by mouth 2 (two) times daily. 01/21/20   Dempsey Coy, MD    Family History Family History  Problem Relation  Age of Onset   Kidney disease Mother    Alcohol abuse Neg Hx    Arthritis Neg Hx    Asthma Neg Hx    Birth defects Neg Hx    Cancer Neg Hx    COPD Neg Hx    Depression Neg Hx    Drug abuse Neg Hx    Diabetes Neg Hx    Early death Neg Hx    Hearing loss Neg Hx    Heart disease Neg Hx    Hyperlipidemia Neg Hx    Hypertension Neg Hx    Learning disabilities Neg Hx    Mental illness Neg Hx    Mental retardation Neg Hx    Miscarriages / Stillbirths Neg Hx    Stroke Neg Hx    Vision loss Neg Hx    Varicose Veins Neg Hx     Social History Social History   Tobacco Use   Smoking status: Every Day    Current packs/day: 0.50    Average packs/day: 0.5 packs/day for 2.0 years (1.0 ttl pk-yrs)    Types: Cigarettes   Smokeless tobacco: Never  Vaping Use   Vaping status: Never Used  Substance Use Topics   Alcohol use: No   Drug use: No     Allergies    Patient has no known allergies.   Review of Systems Review of Systems  Eyes:        Right upper eyelid swelling     Physical Exam Triage Vital Signs ED Triage Vitals  Encounter Vitals Group     BP 08/26/23 0853 112/81     Systolic BP Percentile --      Diastolic BP Percentile --      Pulse Rate 08/26/23 0853 83     Resp 08/26/23 0853 17     Temp 08/26/23 0853 97.8 F (36.6 C)     Temp Source 08/26/23 0853 Oral     SpO2 08/26/23 0853 97 %     Weight --      Height --      Head Circumference --      Peak Flow --      Pain Score 08/26/23 0852 9     Pain Loc --      Pain Education --      Exclude from Growth Chart --    No data found.  Updated Vital Signs BP 112/81 (BP Location: Left Arm)   Pulse 83   Temp 97.8 F (36.6 C) (Oral)   Resp 17   LMP 08/14/2023 (Exact Date)   SpO2 97%   Visual Acuity Right Eye Distance:   Left Eye Distance:   Bilateral Distance:    Right Eye Near:   Left Eye Near:    Bilateral Near:     Physical Exam Vitals and nursing note reviewed.  Constitutional:      General: She is not in acute distress.    Appearance: Normal appearance. She is not ill-appearing.  HENT:     Head: Normocephalic and atraumatic.  Eyes:     General:        Right eye: Hordeolum present. No foreign body or discharge.     Pupils: Pupils are equal, round, and reactive to light.   Cardiovascular:     Rate and Rhythm: Normal rate.  Pulmonary:     Effort: Pulmonary effort is normal.  Skin:    General: Skin is warm and dry.  Neurological:     General: No  focal deficit present.     Mental Status: She is alert and oriented to person, place, and time.  Psychiatric:        Mood and Affect: Mood normal.        Behavior: Behavior normal.      UC Treatments / Results  Labs (all labs ordered are listed, but only abnormal results are displayed) Labs Reviewed - No data to display  EKG   Radiology No results found.  Procedures Procedures (including  critical care time)  Medications Ordered in UC Medications - No data to display  Initial Impression / Assessment and Plan / UC Course  I have reviewed the triage vital signs and the nursing notes.  Pertinent labs & imaging results that were available during my care of the patient were reviewed by me and considered in my medical decision making (see chart for details).     Reviewed exam and symptoms with patient.  No red flags.  Will start erythromycin  ointment.  Encouraged warm compresses frequently.  PCP follow-up if symptoms do not improve.  ER precautions reviewed and patient verbalized understanding. Final Clinical Impressions(s) / UC Diagnoses   Final diagnoses:  Hordeolum externum of right upper eyelid     Discharge Instructions      Start erythromycin  ointment 3 times a day for 7 days.  Warm compresses to the eye as needed.  Please follow-up with your PCP if your symptoms do not improve.  Please go to the ER for any worsening symptoms.  I hope you feel better soon!    ED Prescriptions     Medication Sig Dispense Auth. Provider   erythromycin  ophthalmic ointment Place a 1/2 inch ribbon of ointment on the right upper eyelid lash line 3 times a day for 7 days 3.5 g Olga Seyler, Jodi R, NP      PDMP not reviewed this encounter.   Loreda Myla SAUNDERS, NP 08/26/23 0930

## 2023-08-26 NOTE — ED Triage Notes (Signed)
 Pt present with c/o a stye to the rt eye lid. States she can barely see and c/o swelling.

## 2023-08-26 NOTE — Discharge Instructions (Signed)
 Start erythromycin  ointment 3 times a day for 7 days.  Warm compresses to the eye as needed.  Please follow-up with your PCP if your symptoms do not improve.  Please go to the ER for any worsening symptoms.  I hope you feel better soon!

## 2024-02-21 ENCOUNTER — Other Ambulatory Visit: Payer: Self-pay

## 2024-02-21 ENCOUNTER — Emergency Department (HOSPITAL_COMMUNITY)
Admission: EM | Admit: 2024-02-21 | Discharge: 2024-02-22 | Disposition: A | Discharge status: Home / Self Care | Payer: Self-pay | Attending: Emergency Medicine | Admitting: Emergency Medicine

## 2024-02-21 ENCOUNTER — Emergency Department (HOSPITAL_COMMUNITY): Payer: Self-pay

## 2024-02-21 ENCOUNTER — Encounter (HOSPITAL_COMMUNITY): Payer: Self-pay | Admitting: Emergency Medicine

## 2024-02-21 DIAGNOSIS — R059 Cough, unspecified: Secondary | ICD-10-CM | POA: Insufficient documentation

## 2024-02-21 DIAGNOSIS — J45909 Unspecified asthma, uncomplicated: Secondary | ICD-10-CM | POA: Insufficient documentation

## 2024-02-21 DIAGNOSIS — F17200 Nicotine dependence, unspecified, uncomplicated: Secondary | ICD-10-CM | POA: Insufficient documentation

## 2024-02-21 DIAGNOSIS — R0789 Other chest pain: Secondary | ICD-10-CM | POA: Insufficient documentation

## 2024-02-21 DIAGNOSIS — E119 Type 2 diabetes mellitus without complications: Secondary | ICD-10-CM | POA: Insufficient documentation

## 2024-02-21 DIAGNOSIS — Z7951 Long term (current) use of inhaled steroids: Secondary | ICD-10-CM | POA: Insufficient documentation

## 2024-02-21 LAB — BASIC METABOLIC PANEL WITH GFR
Anion gap: 8 (ref 5–15)
BUN: 10 mg/dL (ref 6–20)
CO2: 23 mmol/L (ref 22–32)
Calcium: 9.1 mg/dL (ref 8.9–10.3)
Chloride: 103 mmol/L (ref 98–111)
Creatinine, Ser: 0.83 mg/dL (ref 0.44–1.00)
GFR, Estimated: >60 mL/min (ref >60)
Glucose, Bld: 100 mg/dL — ABNORMAL HIGH (ref 70–99)
Potassium: 3.5 mmol/L (ref 3.5–5.1)
Sodium: 134 mmol/L — ABNORMAL LOW (ref 135–145)

## 2024-02-21 LAB — HCG, SERUM, QUALITATIVE: Preg, Serum: NEGATIVE

## 2024-02-21 LAB — TROPONIN I (HIGH SENSITIVITY): Troponin I (High Sensitivity): 2 ng/L (ref <18)

## 2024-02-21 NOTE — ED Provider Notes (Signed)
 [place] EMERGENCY DEPARTMENT AT [facility] Provider Note   CSN: [id number] Arrival date & time: 02/21/24  2227     Patient presents with: Chest Pain   Joanna [name] is a 33 y.o. female. Patient with past medical history significant for asthma, T2DM, and Tobacco Use Disorder presenting w/ CC: I feel something moving under my left breast when I cough. Patient reports feelings something moving w/ associated dull pain under her left breast intermittently when she coughs. Patient says the feeling is not associated with breathing and it is not present at rest, just occasionally present w/ coughing. Patient says she has not been coughing a lot recently; had PNA one month ago but has since recovered. No chest pain, SOB, N/V, abdominal pain, back pain. Patient says she stopped smoking one month ago; only uses vaping devices now.   {Add pertinent medical, surgical, social history, OB history to HPI:32947}  Chest Pain      Prior to Admission medications   Medication Sig Start Date End Date Taking? Authorizing Provider  albuterol  (VENTOLIN  HFA) 108 (90 Base) MCG/ACT inhaler Inhale 2 puffs into the lungs every 4 (four) hours as needed for wheezing or shortness of breath. 05/22/23   Christopher Savannah, PA-C  cetirizine  (ZYRTEC  ALLERGY) 10 MG tablet Take 1 tablet (10 mg total) by mouth daily. 05/22/23   Christopher Savannah, PA-C  erythromycin  ophthalmic ointment Place a 1/2 inch ribbon of ointment on the right upper eyelid lash line 3 times a day for 7 days 08/26/23   Mayer, Jodi R, NP  fluticasone (FLOVENT  HFA) 44 MCG/ACT inhaler Inhale 2 puffs into the lungs 2 (two) times daily. 04/14/19   Cris Burnard DEL, MD  ibuprofen  (ADVIL ) 600 MG tablet Take 1 tablet (600 mg total) by mouth every 6 (six) hours as needed. 05/23/19   Loreli Suzen BIRCH, CNM  predniSONE  (DELTASONE ) 20 MG tablet Take 2 tablets daily with breakfast. 05/22/23   Christopher Savannah, PA-C  Prenatal Vit-Fe Fumarate-FA (PRENATAL VITAMIN) 27-0.8 MG  TABS Take 1 tablet by mouth daily. 11/14/18   Chrystal Lamarr GORMAN, MD  promethazine -dextromethorphan (PROMETHAZINE -DM) 6.25-15 MG/5ML syrup Take 5 mLs by mouth 3 (three) times daily as needed for cough. 05/22/23   Christopher Savannah, PA-C  varenicline  (CHANTIX  CONTINUING MONTH PAK) 1 MG tablet Take 1 tablet (1 mg total) by mouth 2 (two) times daily. 01/21/20   Dempsey Coy, MD    Allergies: Patient has no known allergies.    Review of Systems  Cardiovascular:  Positive for chest pain.    Updated Vital Signs BP 127/87 (BP Location: Left Arm)   Pulse 81   Temp 98.2 F (36.8 C) (Oral)   Resp 16   Wt 99.3 kg   LMP 02/19/2024 (Exact Date)   SpO2 100%   BMI 37.58 kg/m   Physical Exam  (all labs ordered are listed, but only abnormal results are displayed) Labs Reviewed  BASIC METABOLIC PANEL WITH GFR  HCG, SERUM, QUALITATIVE  CBC  TROPONIN I (HIGH SENSITIVITY)    EKG: None  Radiology: DG Chest 2 View Result Date: 02/21/2024 CLINICAL DATA:  Chest pain under the left breast worse with the breast EXAM: CHEST - 2 VIEW COMPARISON:  07/09/2023 FINDINGS: The heart size and mediastinal contours are within normal limits. Both lungs are clear. The visualized skeletal structures are unremarkable. IMPRESSION: No active cardiopulmonary disease. Electronically Signed   By: Norman Gatlin M.D.   On: 02/21/2024 23:15    {Document cardiac monitor, telemetry assessment  procedure when appropriate:32947} Procedures   Medications Ordered in the ED - No data to display    {Click here for ABCD2, HEART and other calculators REFRESH Note before signing:1}                              Medical Decision Making Amount and/or Complexity of Data Reviewed Labs: ordered. Radiology: ordered.   ***  {Document critical care time when appropriate  Document review of labs and clinical decision tools ie CHADS2VASC2, etc  Document your independent review of radiology images and any outside records  Document your  discussion with family members, caretakers and with consultants  Document social determinants of health affecting pt's care  Document your decision making why or why not admission, treatments were needed:32947:::1}   Final diagnoses:  None    ED Discharge Orders     None

## 2024-02-21 NOTE — Medical Student Note (Incomplete)
 [facility]-EMERGENC[facility] DEPT Provider Student Note For educational purposes for Medical, PA and NP students only and not part of the legal medical record.   CSN: [id number] Arrival date & time: 02/21/24  2227      History   Chief Complaint Chief Complaint  Patient presents with   Chest Pain    HPI Joanna [name] is a 33 y.o. female w/ a PMH of asthma, T2DM, and Tobacco Use Disorder presenting w/ CC: I feel something moving under my left breast when I cough. Patient reports feelings something moving w/ associated dull pain under her left breast intermittently when she coughs for the last 3 days. Nothing of note at time of onset. Patient says the feeling is not associated with breathing and it is not present at rest, just occasionally present w/ coughing. Unable to give me further detail about the nature of the discomfort. Patient says she has not been coughing a lot recently; had PNA one month ago but has since recovered. No chest pain, SOB, N/V, abdominal pain, back pain. Patient says she stopped smoking one month ago; only uses vaping devices now.    Chest Pain   Past Medical History:  Diagnosis Date   Asthma    inhaler used 3 months   Diabetes mellitus without complication (HCC)    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   IUGR (intrauterine growth restriction) affecting care of mother 04/18/2019   Transient hypertension of pregnancy 04/08/2019   Urinary tract infection    Pyelonephritis during pregnancy    Patient Active Problem List   Diagnosis Date Noted   Prediabetes 12/11/2019   Contraception management 12/11/2019   Dry skin dermatitis 12/11/2019   Transient hypertension of pregnancy 04/08/2019   Rubella non-immune status, antepartum 12/03/2018   History of IUFD 11/29/2018   TOBACCO ABUSE 10/29/2008   Mild intermittent asthma 09/14/2006   Eczema 09/14/2006    Past Surgical History:  Procedure Laterality Date   HERNIA REPAIR      OB History     Gravida  6   Para  6   Term  5    Preterm  1   AB  0   Living  5      SAB  0   IAB  0   Ectopic  0   Multiple  0   Live Births  5            Home Medications    Prior to Admission medications   Medication Sig Start Date End Date Taking? Authorizing Provider  albuterol  (VENTOLIN  HFA) 108 (90 Base) MCG/ACT inhaler Inhale 2 puffs into the lungs every 4 (four) hours as needed for wheezing or shortness of breath. 05/22/23   Christopher Savannah, PA-C  cetirizine  (ZYRTEC  ALLERGY) 10 MG tablet Take 1 tablet (10 mg total) by mouth daily. 05/22/23   Christopher Savannah, PA-C  erythromycin  ophthalmic ointment Place a 1/2 inch ribbon of ointment on the right upper eyelid lash line 3 times a day for 7 days 08/26/23   Mayer, Jodi R, NP  fluticasone (FLOVENT  HFA) 44 MCG/ACT inhaler Inhale 2 puffs into the lungs 2 (two) times daily. 04/14/19   Cris Burnard DEL, MD  ibuprofen  (ADVIL ) 600 MG tablet Take 1 tablet (600 mg total) by mouth every 6 (six) hours as needed. 05/23/19   Loreli Iha D, CNM  predniSONE  (DELTASONE ) 20 MG tablet Take 2 tablets daily with breakfast. 05/22/23   Christopher Savannah, PA-C  Prenatal Vit-Fe Fumarate-FA (PRENATAL VITAMIN) 27-0.8 MG TABS  Take 1 tablet by mouth daily. 11/14/18   Chrystal Lamarr RAMAN, MD  promethazine -dextromethorphan (PROMETHAZINE -DM) 6.25-15 MG/5ML syrup Take 5 mLs by mouth 3 (three) times daily as needed for cough. 05/22/23   Christopher Savannah, PA-C  varenicline  (CHANTIX  CONTINUING MONTH PAK) 1 MG tablet Take 1 tablet (1 mg total) by mouth 2 (two) times daily. 01/21/20   Dempsey Coy, MD    Family History Family History  Problem Relation Age of Onset   Kidney disease Mother    Alcohol abuse Neg Hx    Arthritis Neg Hx    Asthma Neg Hx    Birth defects Neg Hx    Cancer Neg Hx    COPD Neg Hx    Depression Neg Hx    Drug abuse Neg Hx    Diabetes Neg Hx    Early death Neg Hx    Hearing loss Neg Hx    Heart disease Neg Hx    Hyperlipidemia Neg Hx    Hypertension Neg Hx    Learning disabilities Neg Hx     Mental illness Neg Hx    Mental retardation Neg Hx    Miscarriages / Stillbirths Neg Hx    Stroke Neg Hx    Vision loss Neg Hx    Varicose Veins Neg Hx     Social History Social History   Tobacco Use   Smoking status: Every Day    Current packs/day: 0.50    Average packs/day: 0.5 packs/day for 2.0 years (1.0 ttl pk-yrs)    Types: Cigarettes   Smokeless tobacco: Never  Vaping Use   Vaping status: Never Used  Substance Use Topics   Alcohol use: No   Drug use: No     Allergies   Patient has no known allergies.   Review of Systems Review of Systems  Cardiovascular:  Positive for chest pain.     Physical Exam Updated Vital Signs BP 127/87 (BP Location: Left Arm)   Pulse 81   Temp 98.2 F (36.8 C) (Oral)   Resp 16   Wt 99.3 kg   LMP 02/19/2024 (Exact Date)   SpO2 100%   BMI 37.58 kg/m   Physical Exam   ED Treatments / Results  Labs (all labs ordered are listed, but only abnormal results are displayed) Labs Reviewed  BASIC METABOLIC PANEL WITH GFR  HCG, SERUM, QUALITATIVE  CBC  TROPONIN I (HIGH SENSITIVITY)    EKG  Radiology DG Chest 2 View Result Date: 02/21/2024 CLINICAL DATA:  Chest pain under the left breast worse with the breast EXAM: CHEST - 2 VIEW COMPARISON:  07/09/2023 FINDINGS: The heart size and mediastinal contours are within normal limits. Both lungs are clear. The visualized skeletal structures are unremarkable. IMPRESSION: No active cardiopulmonary disease. Electronically Signed   By: Norman Gatlin M.D.   On: 02/21/2024 23:15    Procedures Procedures (including critical care time)  Medications Ordered in ED Medications - No data to display   Initial Impression / Assessment and Plan / ED Course  I have reviewed the triage vital signs and the nursing notes.  Pertinent labs & imaging results that were available during my care of the patient were reviewed by me and considered in my medical decision making (see chart for details).      ***  Final Clinical Impressions(s) / ED Diagnoses   Final diagnoses:  None    New Prescriptions New Prescriptions   No medications on file

## 2024-02-21 NOTE — ED Triage Notes (Signed)
 Pt in with sharp cp under L breast x 2 days, worse with deep breaths. Denies any sob, does state she does a lot of heavy lifting at work

## 2024-02-22 LAB — CBC
HCT: 38 % (ref 36.0–46.0)
Hemoglobin: 12.6 g/dL (ref 12.0–15.0)
MCH: 29.5 pg (ref 26.0–34.0)
MCHC: 33.2 g/dL (ref 30.0–36.0)
MCV: 89 fL (ref 80.0–100.0)
Platelets: 295 K/uL (ref 150–400)
RBC: 4.27 MIL/uL (ref 3.87–5.11)
RDW: 13.7 % (ref 11.5–15.5)
WBC: 13.2 K/uL — ABNORMAL HIGH (ref 4.0–10.5)
nRBC: 0 % (ref 0.0–0.2)

## 2024-02-22 MED ORDER — KETOROLAC TROMETHAMINE 15 MG/ML IJ SOLN
15.0000 mg | Freq: Once | INTRAMUSCULAR | Status: AC
  Administered 2024-02-22: 15 mg via INTRAMUSCULAR
  Filled 2024-02-22: qty 1

## 2024-02-22 MED ORDER — NAPROXEN 500 MG PO TABS: 500.0000 mg | ORAL_TABLET | Freq: Two times a day (BID) | ORAL | 0 refills | Status: DC

## 2024-02-22 NOTE — Discharge Instructions (Signed)
 Your workup this morning was reassuring.  Your pain seems to be musculoskeletal in nature.  I have prescribed an anti-inflammatory medication to take at home.  If you develop shortness of breath or other life-threatening symptoms please return to the emergency department.

## 2024-03-12 ENCOUNTER — Other Ambulatory Visit: Payer: Self-pay

## 2024-03-12 ENCOUNTER — Emergency Department (HOSPITAL_COMMUNITY): Payer: Self-pay

## 2024-03-12 ENCOUNTER — Emergency Department (HOSPITAL_COMMUNITY)
Admission: EM | Admit: 2024-03-12 | Discharge: 2024-03-12 | Disposition: A | Discharge status: Home / Self Care | Payer: Self-pay | Attending: Emergency Medicine | Admitting: Emergency Medicine

## 2024-03-12 ENCOUNTER — Encounter (HOSPITAL_COMMUNITY): Payer: Self-pay | Admitting: Emergency Medicine

## 2024-03-12 DIAGNOSIS — J45909 Unspecified asthma, uncomplicated: Secondary | ICD-10-CM | POA: Insufficient documentation

## 2024-03-12 DIAGNOSIS — E119 Type 2 diabetes mellitus without complications: Secondary | ICD-10-CM | POA: Insufficient documentation

## 2024-03-12 DIAGNOSIS — R0789 Other chest pain: Secondary | ICD-10-CM | POA: Insufficient documentation

## 2024-03-12 DIAGNOSIS — R059 Cough, unspecified: Secondary | ICD-10-CM | POA: Insufficient documentation

## 2024-03-12 LAB — CBC
HCT: 38.5 % (ref 36.0–46.0)
Hemoglobin: 12.9 g/dL (ref 12.0–15.0)
MCH: 30.1 pg (ref 26.0–34.0)
MCHC: 33.5 g/dL (ref 30.0–36.0)
MCV: 89.7 fL (ref 80.0–100.0)
Platelets: 290 K/uL (ref 150–400)
RBC: 4.29 MIL/uL (ref 3.87–5.11)
RDW: 13.8 % (ref 11.5–15.5)
WBC: 12.8 K/uL — ABNORMAL HIGH (ref 4.0–10.5)
nRBC: 0 % (ref 0.0–0.2)

## 2024-03-12 LAB — BASIC METABOLIC PANEL WITH GFR
Anion gap: 13 (ref 5–15)
BUN: 8 mg/dL (ref 6–20)
CO2: 21 mmol/L — ABNORMAL LOW (ref 22–32)
Calcium: 9.2 mg/dL (ref 8.9–10.3)
Chloride: 104 mmol/L (ref 98–111)
Creatinine, Ser: 0.77 mg/dL (ref 0.44–1.00)
GFR, Estimated: >60 mL/min (ref >60)
Glucose, Bld: 71 mg/dL (ref 70–99)
Potassium: 3.7 mmol/L (ref 3.5–5.1)
Sodium: 137 mmol/L (ref 135–145)

## 2024-03-12 LAB — RESP PANEL BY RT-PCR (RSV, FLU A&B, COVID)  RVPGX2
Influenza A by PCR: NEGATIVE
Influenza B by PCR: NEGATIVE
Resp Syncytial Virus by PCR: NEGATIVE
SARS Coronavirus 2 by RT PCR: NEGATIVE

## 2024-03-12 LAB — HCG, SERUM, QUALITATIVE: Preg, Serum: NEGATIVE

## 2024-03-12 LAB — TROPONIN T, HIGH SENSITIVITY: Troponin T High Sensitivity: <15 ng/L (ref 0–19)

## 2024-03-12 LAB — D-DIMER, QUANTITATIVE: D-Dimer, Quant: <0.27 ug{FEU}/mL (ref 0.00–0.50)

## 2024-03-12 NOTE — ED Triage Notes (Signed)
 Patient would like to be checked for Covid. 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   She also complains of burning chest pain that radiates into her neck. Symptoms started today. She also reports dizziness.

## 2024-03-12 NOTE — ED Provider Notes (Signed)
 Emergency Department Provider Note   I have reviewed the triage vital signs and the nursing notes.   HISTORY  Chief Complaint Chest Pain   HPI Joanna [name] is a 33 y.o. female with past history viewed below including diabetes and asthma presents to the emergency department with continued moving feeling in the left lower chest.  She denies any sharp, pleuritic pain.  No exertional component to symptoms.  Does not seem affected by eating or drinking.  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  She has had a sore throat along with a mild cough but otherwise fairly mild symptoms.  No fevers.  She was seen on 8/6 in the ED with similar pain and states that has continued. No abdominal pain or vomiting.    Past Medical History:  Diagnosis Date   Asthma    inhaler used 3 months   Diabetes mellitus without complication (HCC)    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   IUGR (intrauterine growth restriction) affecting care of mother 04/18/2019   Transient hypertension of pregnancy 04/08/2019   Urinary tract infection    Pyelonephritis during pregnancy    Review of Systems  Constitutional: No fever/chills Cardiovascular: Positive chest pain. Respiratory: Denies shortness of breath. Gastrointestinal: No abdominal pain.  No nausea, no vomiting.   Musculoskeletal: Negative for back pain. Skin: Negative for rash. Neurological: Negative for headaches, focal weakness or numbness.   ____________________________________________   PHYSICAL EXAM:  VITAL SIGNS: ED Triage Vitals  Encounter Vitals Group     BP 03/12/24 1736 133/75     Pulse Rate 03/12/24 1736 78     Resp 03/12/24 1736 (!) 23     Temp 03/12/24 1736 98.4 F (36.9 C)     Temp Source 03/12/24 1736 Oral     SpO2 03/12/24 1736 100 %   Constitutional: Alert and oriented. Well appearing and in no acute distress. Eyes: Conjunctivae are normal.  Head: Atraumatic. Nose: No congestion/rhinnorhea. Mouth/Throat:  Mucous membranes are moist.  Neck: No stridor.   Cardiovascular: Normal rate, regular rhythm. Good peripheral circulation. Grossly normal heart sounds.   Respiratory: Normal respiratory effort.  No retractions. Lungs CTAB. Gastrointestinal: Soft and nontender. No distention.  Musculoskeletal:  No gross deformities of extremities. Neurologic:  Normal speech and language.  Skin:  Skin is warm, dry and intact. No rash noted.  ____________________________________________   LABS (all labs ordered are listed, but only abnormal results are displayed)  Labs Reviewed  BASIC METABOLIC PANEL WITH GFR - Abnormal; Notable for the following components:      Result Value   CO2 21 (*)    All other components within normal limits  CBC - Abnormal; Notable for the following components:   WBC 12.8 (*)    All other components within normal limits  RESP PANEL BY RT-PCR (RSV, FLU A&B, COVID)  [id number]  HCG, SERUM, QUALITATIVE  D-DIMER, QUANTITATIVE  TROPONIN T, HIGH SENSITIVITY   ____________________________________________  EKG   EKG Interpretation Date/Time:  Tuesday March 12 2024 17:37:22 EDT Ventricular Rate:  75 PR Interval:  166 QRS Duration:  104 QT Interval:  360 QTC Calculation: 402 R Axis:   31  Text Interpretation: Sinus rhythm Confirmed by Darra Chew [phone]) on 03/12/2024 6:27:03 PM        ____________________________________________  RADIOLOGY  DG Chest 2 View Result Date: 03/12/2024 CLINICAL DATA:  Chest pain EXAM: CHEST - 2 VIEW COMPARISON:  02/21/2024 FINDINGS: The heart size and mediastinal  contours are within normal limits. Both lungs are clear. The visualized skeletal structures are unremarkable. IMPRESSION: No active cardiopulmonary disease. Electronically Signed   By: CHRISTELLA.  Shick M.D.   On: 03/12/2024 19:14    ____________________________________________   PROCEDURES  Procedure(s) performed:   Procedures  None   ____________________________________________   INITIAL IMPRESSION / ASSESSMENT AND PLAN / ED COURSE  Pertinent labs & imaging results that were available during my care of the patient were reviewed by me and considered in my medical decision making (see chart for details).   This patient is Presenting for Evaluation of CP, which does require a range of treatment options, and is a complaint that involves a high risk of morbidity and mortality.  The Differential Diagnoses includes but is not exclusive to acute coronary syndrome, aortic dissection, pulmonary embolism, cardiac tamponade, community-acquired pneumonia, pericarditis, musculoskeletal chest wall pain, etc.  I decided to review pertinent External Data, and in summary labs reviewed from 8/6 including normal troponin.    Clinical Laboratory Tests Ordered, included troponin and d dimer negative. No AKI. Pregnancy negative. Viral panel negative.   Radiologic Tests Ordered, included CXR. I independently interpreted the images and agree with radiology interpretation.   Cardiac Monitor Tracing which shows NSR.    Social Determinants of Health Risk patient is a smoker.   Medical Decision Making: Summary:  Patient presents emergency department with left-sided moving type CP. No SOB.  Reassuring workup on 8/6.  She is low risk for PE and description of pain is fairly atypical.  I will add on a D-dimer which was not done previously.  Along with repeat labs including troponin, chest x-ray, COVID PCR.   Reevaluation with update and discussion with patient. ED CP workup is reassuring. Stable for discharge.   Patient's presentation is most consistent with acute presentation with potential threat to life or bodily function.   Disposition: discharge  ____________________________________________  FINAL CLINICAL IMPRESSION(S) / ED DIAGNOSES  Final diagnoses:  Atypical chest pain    Note:  This document was prepared using Dragon voice  recognition software and may include unintentional dictation errors.  Fonda Law, MD, [facility]    Candra Wegner, Fonda MATSU, MD 03/12/24 [phone]

## 2024-03-12 NOTE — Discharge Instructions (Signed)

## 2024-03-12 NOTE — ED Notes (Signed)
 Was unable to obtain blood labs

## 2024-03-28 ENCOUNTER — Emergency Department (HOSPITAL_COMMUNITY)
Admission: EM | Admit: 2024-03-28 | Discharge: 2024-03-29 | Disposition: A | Discharge status: Home / Self Care | Payer: Medicaid - Out of State | Attending: Emergency Medicine | Admitting: Emergency Medicine

## 2024-03-28 ENCOUNTER — Emergency Department (HOSPITAL_COMMUNITY): Payer: Medicaid - Out of State

## 2024-03-28 ENCOUNTER — Encounter (HOSPITAL_COMMUNITY): Payer: Self-pay

## 2024-03-28 ENCOUNTER — Other Ambulatory Visit: Payer: Self-pay

## 2024-03-28 DIAGNOSIS — R079 Chest pain, unspecified: Secondary | ICD-10-CM | POA: Diagnosis present

## 2024-03-28 DIAGNOSIS — R0789 Other chest pain: Secondary | ICD-10-CM | POA: Insufficient documentation

## 2024-03-28 DIAGNOSIS — E119 Type 2 diabetes mellitus without complications: Secondary | ICD-10-CM | POA: Insufficient documentation

## 2024-03-28 DIAGNOSIS — J45909 Unspecified asthma, uncomplicated: Secondary | ICD-10-CM | POA: Diagnosis not present

## 2024-03-28 DIAGNOSIS — Z7951 Long term (current) use of inhaled steroids: Secondary | ICD-10-CM | POA: Diagnosis not present

## 2024-03-28 LAB — BASIC METABOLIC PANEL WITH GFR
Anion gap: 13 (ref 5–15)
BUN: 9 mg/dL (ref 6–20)
CO2: 20 mmol/L — ABNORMAL LOW (ref 22–32)
Calcium: 9.6 mg/dL (ref 8.9–10.3)
Chloride: 102 mmol/L (ref 98–111)
Creatinine, Ser: 0.76 mg/dL (ref 0.44–1.00)
GFR, Estimated: >60 mL/min (ref >60)
Glucose, Bld: 96 mg/dL (ref 70–99)
Potassium: 3.8 mmol/L (ref 3.5–5.1)
Sodium: 135 mmol/L (ref 135–145)

## 2024-03-28 LAB — CBC
HCT: 39.2 % (ref 36.0–46.0)
Hemoglobin: 13.1 g/dL (ref 12.0–15.0)
MCH: 29.9 pg (ref 26.0–34.0)
MCHC: 33.4 g/dL (ref 30.0–36.0)
MCV: 89.5 fL (ref 80.0–100.0)
Platelets: 314 K/uL (ref 150–400)
RBC: 4.38 MIL/uL (ref 3.87–5.11)
RDW: 13.2 % (ref 11.5–15.5)
WBC: 10.9 K/uL — ABNORMAL HIGH (ref 4.0–10.5)
nRBC: 0 % (ref 0.0–0.2)

## 2024-03-28 LAB — HCG, SERUM, QUALITATIVE: Preg, Serum: NEGATIVE

## 2024-03-28 LAB — TROPONIN T, HIGH SENSITIVITY: Troponin T High Sensitivity: <15 ng/L (ref 0–19)

## 2024-03-28 NOTE — ED Triage Notes (Signed)
 Pt has c/o chest pain that radiates down her left arm. Pt states the pain began an hour ago, went away, then came back. Pt also has a headache.

## 2024-03-29 ENCOUNTER — Ambulatory Visit
Admission: EM | Admit: 2024-03-29 | Discharge: 2024-03-29 | Disposition: A | Discharge status: Home / Self Care | Payer: Self-pay | Attending: Family Medicine | Admitting: Family Medicine

## 2024-03-29 DIAGNOSIS — F419 Anxiety disorder, unspecified: Secondary | ICD-10-CM

## 2024-03-29 MED ORDER — HYDROXYZINE HCL 25 MG PO TABS: 12.5000 mg | ORAL_TABLET | Freq: Three times a day (TID) | ORAL | 0 refills | Status: DC | PRN

## 2024-03-29 MED ORDER — IBUPROFEN 600 MG PO TABS: 600.0000 mg | ORAL_TABLET | Freq: Four times a day (QID) | ORAL | 0 refills | Status: DC | PRN

## 2024-03-29 MED ORDER — KETOROLAC TROMETHAMINE 30 MG/ML IJ SOLN
30.0000 mg | Freq: Once | INTRAMUSCULAR | Status: AC
  Administered 2024-03-29: 30 mg via INTRAMUSCULAR
  Filled 2024-03-29: qty 1

## 2024-03-29 MED ORDER — FLUOXETINE HCL 10 MG PO CAPS: 10.0000 mg | ORAL_CAPSULE | Freq: Every day | ORAL | 0 refills | Status: DC

## 2024-03-29 NOTE — ED Provider Notes (Signed)
 [facility]  Note:  This document was prepared using Conservation officer, historic buildings and may include unintentional dictation errors.  MRN: [id number] DOB: 1991-07-15  Subjective:   Joanna [name] is a 33 y.o. female presenting for acute onset 4 hours ago of numbness of her lips, nausea without vomiting, tension in her neck.  Has had multiple frequent episodes like this including intermittent chest pains.  Has felt very anxious lately about her symptoms and in general.  Has had multiple emergency room visits most of which has resulted in negative workup.  Has never taken mental health medications or seen a mental health provider.  No thoughts of suicidal ideation, homicidal ideation.  No current facility-administered medications for this encounter.  Current Outpatient Medications:    albuterol  (VENTOLIN  HFA) 108 (90 Base) MCG/ACT inhaler, Inhale 2 puffs into the lungs every 4 (four) hours as needed for wheezing or shortness of breath., Disp: 18 g, Rfl: 0   cetirizine  (ZYRTEC  ALLERGY) 10 MG tablet, Take 1 tablet (10 mg total) by mouth daily., Disp: 30 tablet, Rfl: 0   erythromycin  ophthalmic ointment, Place a 1/2 inch ribbon of ointment on the right upper eyelid lash line 3 times a day for 7 days, Disp: 3.5 g, Rfl: 0   fluticasone (FLOVENT  HFA) 44 MCG/ACT inhaler, Inhale 2 puffs into the lungs 2 (two) times daily., Disp: 1 Inhaler, Rfl: 12   ibuprofen  (ADVIL ) 600 MG tablet, Take 1 tablet (600 mg total) by mouth every 6 (six) hours as needed., Disp: 30 tablet, Rfl: 0   ibuprofen  (ADVIL ) 600 MG tablet, Take 1 tablet (600 mg total) by mouth every 6 (six) hours as needed., Disp: 30 tablet, Rfl: 0   naproxen  (NAPROSYN ) 500 MG tablet, Take 1 tablet (500 mg total) by mouth 2 (two) times daily., Disp: 30 tablet, Rfl: 0   predniSONE  (DELTASONE ) 20 MG tablet, Take 2 tablets daily with breakfast., Disp: 10 tablet, Rfl: 0   Prenatal Vit-Fe Fumarate-FA (PRENATAL VITAMIN) 27-0.8 MG  TABS, Take 1 tablet by mouth daily., Disp: 90 tablet, Rfl: 3   promethazine -dextromethorphan (PROMETHAZINE -DM) 6.25-15 MG/5ML syrup, Take 5 mLs by mouth 3 (three) times daily as needed for cough., Disp: 200 mL, Rfl: 0   varenicline  (CHANTIX  CONTINUING MONTH PAK) 1 MG tablet, Take 1 tablet (1 mg total) by mouth 2 (two) times daily., Disp: 60 tablet, Rfl: 0   No Known Allergies  Past Medical History:  Diagnosis Date   Asthma    inhaler used 3 months   Diabetes mellitus without complication (HCC)    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   IUGR (intrauterine growth restriction) affecting care of mother 04/18/2019   Transient hypertension of pregnancy 04/08/2019   Urinary tract infection    Pyelonephritis during pregnancy     Past Surgical History:  Procedure Laterality Date   HERNIA REPAIR      Family History  Problem Relation Age of Onset   Kidney disease Mother    Alcohol abuse Neg Hx    Arthritis Neg Hx    Asthma Neg Hx    Birth defects Neg Hx    Cancer Neg Hx    COPD Neg Hx    Depression Neg Hx    Drug abuse Neg Hx    Diabetes Neg Hx    Early death Neg Hx    Hearing loss Neg Hx    Heart disease Neg Hx    Hyperlipidemia Neg Hx    Hypertension Neg Hx  Learning disabilities Neg Hx    Mental illness Neg Hx    Mental retardation Neg Hx    Miscarriages / Stillbirths Neg Hx    Stroke Neg Hx    Vision loss Neg Hx    Varicose Veins Neg Hx     Social History   Tobacco Use   Smoking status: Every Day    Current packs/day: 0.50    Average packs/day: 0.5 packs/day for 2.0 years (1.0 ttl pk-yrs)    Types: Cigarettes   Smokeless tobacco: Never  Vaping Use   Vaping status: Never Used  Substance Use Topics   Alcohol use: No   Drug use: No    ROS   Objective:   Vitals: BP 126/84 (BP Location: Right Arm)   Resp 16   LMP 03/18/2024 (Exact Date)   SpO2 98%   Physical Exam Constitutional:      General: She is not in acute distress.    Appearance: Normal appearance. She is  well-developed. She is not ill-appearing, toxic-appearing or diaphoretic.  HENT:     Head: Normocephalic and atraumatic.     Nose: Nose normal.     Mouth/Throat:     Mouth: Mucous membranes are moist.  Eyes:     General: No scleral icterus.       Right eye: No discharge.        Left eye: No discharge.     Extraocular Movements: Extraocular movements intact.  Cardiovascular:     Rate and Rhythm: Normal rate.  Pulmonary:     Effort: Pulmonary effort is normal.  Skin:    General: Skin is warm and dry.  Neurological:     General: No focal deficit present.     Mental Status: She is alert and oriented to person, place, and time.  Psychiatric:        Attention and Perception: Attention and perception normal. She is attentive. She does not perceive auditory or visual hallucinations.        Mood and Affect: Affect normal. Mood is anxious. Mood is not depressed or elated. Affect is not labile, blunt, flat, angry, tearful or inappropriate.        Speech: She is communicative. Speech is not rapid and pressured, delayed, slurred or tangential.        Behavior: Behavior normal. Behavior is not agitated, slowed, aggressive, withdrawn, hyperactive or combative.        Thought Content: Thought content is not paranoid or delusional. Thought content does not include homicidal or suicidal ideation. Thought content does not include homicidal or suicidal plan.        Cognition and Memory: Cognition is not impaired. Memory is not impaired. She does not exhibit impaired recent memory or impaired remote memory.        Judgment: Judgment is not impulsive or inappropriate.     DG Chest 2 View Result Date: 03/28/2024 CLINICAL DATA:  Chest pain EXAM: CHEST - 2 VIEW COMPARISON:  03/12/2024 FINDINGS: The heart size and mediastinal contours are within normal limits. Both lungs are clear. The visualized skeletal structures are unremarkable. No pneumothorax. IMPRESSION: No active cardiopulmonary disease.  Electronically Signed   By: Franky Crease M.D.   On: 03/28/2024 22:41   DG Chest 2 View Result Date: 03/12/2024 CLINICAL DATA:  Chest pain EXAM: CHEST - 2 VIEW COMPARISON:  02/21/2024 FINDINGS: The heart size and mediastinal contours are within normal limits. Both lungs are clear. The visualized skeletal structures are unremarkable. IMPRESSION: No active cardiopulmonary disease.  Electronically Signed   By: CHRISTELLA.  Shick M.D.   On: 03/12/2024 19:14   DG Chest 2 View Result Date: 02/21/2024 CLINICAL DATA:  Chest pain under the left breast worse with the breast EXAM: CHEST - 2 VIEW COMPARISON:  07/09/2023 FINDINGS: The heart size and mediastinal contours are within normal limits. Both lungs are clear. The visualized skeletal structures are unremarkable. IMPRESSION: No active cardiopulmonary disease. Electronically Signed   By: Norman Gatlin M.D.   On: 02/21/2024 23:15   Recent Results (from the past 2160 hours)  Basic metabolic panel     Status: Abnormal   Collection Time: 02/21/24 11:03 PM  Result Value Ref Range   Sodium 134 (L) 135 - 145 mmol/L   Potassium 3.5 3.5 - 5.1 mmol/L   Chloride 103 98 - 111 mmol/L   CO2 23 22 - 32 mmol/L   Glucose, Bld 100 (H) 70 - 99 mg/dL    Comment: Glucose reference range applies only to samples taken after fasting for at least 8 hours.   BUN 10 6 - 20 mg/dL   Creatinine, Ser 9.16 0.44 - 1.00 mg/dL   Calcium 9.1 8.9 - 89.6 mg/dL   GFR, Estimated >39 >39 mL/min    Comment: (NOTE) Calculated using the CKD-EPI Creatinine Equation (2021)    Anion gap 8 5 - 15    Comment: Performed at [facility], [address] [place], [place] [address]  Troponin I (High Sensitivity)     Status: None   Collection Time: 02/21/24 11:03 PM  Result Value Ref Range   Troponin I (High Sensitivity) 2 <18 ng/L    Comment: (NOTE) Elevated high sensitivity troponin I (hsTnI) values and significant  changes across serial measurements may suggest ACS but many other   chronic and acute conditions are known to elevate hsTnI results.  Refer to the Links section for chest pain algorithms and additional  guidance. Performed at [facility], [address] [place], [place] [address]   hCG, serum, qualitative     Status: None   Collection Time: 02/21/24 11:03 PM  Result Value Ref Range   Preg, Serum NEGATIVE NEGATIVE    Comment:        THE SENSITIVITY OF THIS METHODOLOGY IS >10 mIU/mL. Performed at [facility], [address], [place], [place] [address]   CBC     Status: Abnormal   Collection Time: 02/22/24 12:36 AM  Result Value Ref Range   WBC 13.2 (H) 4.0 - 10.5 K/uL   RBC 4.27 3.87 - 5.11 MIL/uL   Hemoglobin 12.6 12.0 - 15.0 g/dL   HCT 61.9 63.9 - 53.9 %   MCV 89.0 80.0 - 100.0 fL   MCH 29.5 26.0 - 34.0 pg   MCHC 33.2 30.0 - 36.0 g/dL   RDW 86.2 88.4 - 84.4 %   Platelets 295 150 - 400 K/uL   nRBC 0.0 0.0 - 0.2 %    Comment: Performed at [facility], [address], [place], [place] [address]  Resp panel by RT-PCR (RSV, Flu A&B, Covid) Anterior Nasal Swab     Status: None   Collection Time: 03/12/24  6:06 PM   Specimen: Anterior Nasal Swab  Result Value Ref Range   SARS Coronavirus 2 by RT PCR NEGATIVE NEGATIVE    Comment: (NOTE) SARS-CoV-2 target nucleic acids are NOT DETECTED.  The SARS-CoV-2 RNA is generally detectable in upper respiratory specimens during the acute phase of infection. The  lowest concentration of SARS-CoV-2 viral copies this assay can detect is 138 copies/mL. A negative result does not preclude SARS-Cov-2 infection and should not be used as the sole basis for treatment or other patient management decisions. A negative result may occur with  improper specimen collection/handling, submission of specimen other than nasopharyngeal swab, presence of viral mutation(s) within the areas targeted by this assay, and inadequate number of viral copies(<138  copies/mL). A negative result must be combined with clinical observations, patient history, and epidemiological information. The expected result is Negative.  Fact Sheet for Patients:  BloggerCourse.com  Fact Sheet for Healthcare Providers:  SeriousBroker.it  This test is no t yet approved or cleared by the [place]  FDA and  has been authorized for detection and/or diagnosis of SARS-CoV-2 by FDA under an Emergency Use Authorization (EUA). This EUA will remain  in effect (meaning this test can be used) for the duration of the COVID-19 declaration under Section 564(b)(1) of the Act, 21 U.S.C.section 360bbb-3(b)(1), unless the authorization is terminated  or revoked sooner.       Influenza A by PCR NEGATIVE NEGATIVE   Influenza B by PCR NEGATIVE NEGATIVE    Comment: (NOTE) The Xpert Xpress SARS-CoV-2/FLU/RSV plus assay is intended as an aid in the diagnosis of influenza from Nasopharyngeal swab specimens and should not be used as a sole basis for treatment. Nasal washings and aspirates are unacceptable for Xpert Xpress SARS-CoV-2/FLU/RSV testing.  Fact Sheet for Patients: BloggerCourse.com  Fact Sheet for Healthcare Providers: SeriousBroker.it  This test is not yet approved or cleared by the [place]  FDA and has been authorized for detection and/or diagnosis of SARS-CoV-2 by FDA under an Emergency Use Authorization (EUA). This EUA will remain in effect (meaning this test can be used) for the duration of the COVID-19 declaration under Section 564(b)(1) of the Act, 21 U.S.C. section 360bbb-3(b)(1), unless the authorization is terminated or revoked.     Resp Syncytial Virus by PCR NEGATIVE NEGATIVE    Comment: (NOTE) Fact Sheet for Patients: BloggerCourse.com  Fact Sheet for Healthcare  Providers: SeriousBroker.it  This test is not yet approved or cleared by the [place]  FDA and has been authorized for detection and/or diagnosis of SARS-CoV-2 by FDA under an Emergency Use Authorization (EUA). This EUA will remain in effect (meaning this test can be used) for the duration of the COVID-19 declaration under Section 564(b)(1) of the Act, 21 U.S.C. section 360bbb-3(b)(1), unless the authorization is terminated or revoked.  Performed at [facility], [address], [place], [place] [address]   Basic metabolic panel     Status: Abnormal   Collection Time: 03/12/24  6:32 PM  Result Value Ref Range   Sodium 137 135 - 145 mmol/L   Potassium 3.7 3.5 - 5.1 mmol/L   Chloride 104 98 - 111 mmol/L   CO2 21 (L) 22 - 32 mmol/L   Glucose, Bld 71 70 - 99 mg/dL    Comment: Glucose reference range applies only to samples taken after fasting for at least 8 hours.   BUN 8 6 - 20 mg/dL   Creatinine, Ser 9.22 0.44 - 1.00 mg/dL   Calcium 9.2 8.9 - 89.6 mg/dL   GFR, Estimated >39 >39 mL/min    Comment: (NOTE) Calculated using the CKD-EPI Creatinine Equation (2021)    Anion gap 13 5 - 15    Comment: Performed at [facility], [address], [place], [place] [address]  CBC     Status:  Abnormal   Collection Time: 03/12/24  6:32 PM  Result Value Ref Range   WBC 12.8 (H) 4.0 - 10.5 K/uL   RBC 4.29 3.87 - 5.11 MIL/uL   Hemoglobin 12.9 12.0 - 15.0 g/dL   HCT 61.4 63.9 - 53.9 %   MCV 89.7 80.0 - 100.0 fL   MCH 30.1 26.0 - 34.0 pg   MCHC 33.5 30.0 - 36.0 g/dL   RDW 86.1 88.4 - 84.4 %   Platelets 290 150 - 400 K/uL   nRBC 0.0 0.0 - 0.2 %    Comment: Performed at [facility], [address] [place], [place] [address]  Troponin T, High Sensitivity     Status: None   Collection Time: 03/12/24  6:32 PM  Result Value Ref Range   Troponin T High Sensitivity <15 0 - 19 ng/L    Comment: (NOTE) Biotin  concentrations > 1000 ng/mL falsely decrease TnT results.  Serial cardiac troponin measurements are suggested.  Refer to the Links section for chest pain algorithms and additional  guidance. Performed at [facility], [address], [place], [place] [address]   hCG, serum, qualitative     Status: None   Collection Time: 03/12/24  6:32 PM  Result Value Ref Range   Preg, Serum NEGATIVE NEGATIVE    Comment:        THE SENSITIVITY OF THIS METHODOLOGY IS >10 mIU/mL. Performed at [facility], [address] [place], [place] [address]   D-dimer, quantitative     Status: None   Collection Time: 03/12/24  6:32 PM  Result Value Ref Range   D-Dimer, Quant <0.27 0.00 - 0.50 ug/mL-FEU    Comment: (NOTE) At the manufacturer cut-off value of 0.5 g/mL FEU, this assay has a negative predictive value of 95-100%.This assay is intended for use in conjunction with a clinical pretest probability (PTP) assessment model to exclude pulmonary embolism (PE) and deep venous thrombosis (DVT) in outpatients suspected of PE or DVT. Results should be correlated with clinical presentation. Performed at [facility], [address], [place], [place] [address]   Basic metabolic panel     Status: Abnormal   Collection Time: 03/28/24 10:02 PM  Result Value Ref Range   Sodium 135 135 - 145 mmol/L   Potassium 3.8 3.5 - 5.1 mmol/L   Chloride 102 98 - 111 mmol/L   CO2 20 (L) 22 - 32 mmol/L   Glucose, Bld 96 70 - 99 mg/dL    Comment: Glucose reference range applies only to samples taken after fasting for at least 8 hours.   BUN 9 6 - 20 mg/dL   Creatinine, Ser 9.23 0.44 - 1.00 mg/dL   Calcium 9.6 8.9 - 89.6 mg/dL   GFR, Estimated >39 >39 mL/min    Comment: (NOTE) Calculated using the CKD-EPI Creatinine Equation (2021)    Anion gap 13 5 - 15    Comment: Performed at [facility], [address] [place], [place] [address]  CBC      Status: Abnormal   Collection Time: 03/28/24 10:02 PM  Result Value Ref Range   WBC 10.9 (H) 4.0 - 10.5 K/uL   RBC 4.38 3.87 - 5.11 MIL/uL   Hemoglobin 13.1 12.0 - 15.0 g/dL   HCT 60.7 63.9 - 53.9 %   MCV 89.5 80.0 - 100.0 fL   MCH 29.9 26.0 - 34.0 pg   MCHC 33.4 30.0 - 36.0 g/dL   RDW  13.2 11.5 - 15.5 %   Platelets 314 150 - 400 K/uL   nRBC 0.0 0.0 - 0.2 %    Comment: Performed at [facility], [address], [place], [place] [address]  Troponin T, High Sensitivity     Status: None   Collection Time: 03/28/24 10:02 PM  Result Value Ref Range   Troponin T High Sensitivity <15 0 - 19 ng/L    Comment: (NOTE) Biotin concentrations > 1000 ng/mL falsely decrease TnT results.  Serial cardiac troponin measurements are suggested.  Refer to the Links section for chest pain algorithms and additional  guidance. Performed at [facility], [address], [place], [place] [address]   hCG, serum, qualitative     Status: None   Collection Time: 03/28/24 10:02 PM  Result Value Ref Range   Preg, Serum NEGATIVE NEGATIVE    Comment:        THE SENSITIVITY OF THIS METHODOLOGY IS >10 mIU/mL. Performed at [facility], [address] [place], [place] [address]    Assessment and Plan :   PDMP not reviewed this encounter.  1. Anxiety    Extensive review of her recent workup over the past 90 days reviewed at length with patient.  At this stage, it would be appropriate to consider management of anxiety with an SSRI, hydroxyzine .  Referral placed to behavioral health clinic.  Counseled patient on potential for adverse effects with medications prescribed/recommended today, ER and return-to-clinic precautions discussed, patient verbalized understanding.    Christopher Savannah, [place] 03/30/24 [phone]

## 2024-03-29 NOTE — ED Triage Notes (Signed)
 Pt reports numbness in lips, nausea and neck pain 3-4 hrs. Pt has not taken any meds for complaints. States she is having episodes likes this x 1-2 months after she had pneumonia. States she is feeling anxious lately and do not how she can stop it.

## 2024-03-29 NOTE — ED Provider Notes (Signed)
 [place] EMERGENCY DEPARTMENT AT [facility] Provider Note   CSN: [id number] Arrival date & time: 03/28/24  2131     Patient presents with: Chest Pain   Joanna [name] is a 33 y.o. female.   HPI     This is a 33 year old female who presents with chest pain.  Patient reports 1 month history of intermittent sharp anterior chest pain that is nonradiating.  It is worse with vaping.  She has been seen and evaluated several times in the last few weeks for the same and had negative workup.  Denies any cough or fevers.  Denies any lower extremity swelling or history of blood clots.  Currently pain-free.  No shortness of breath.  Prior to Admission medications   Medication Sig Start Date End Date Taking? Authorizing Provider  albuterol  (VENTOLIN  HFA) 108 (90 Base) MCG/ACT inhaler Inhale 2 puffs into the lungs every 4 (four) hours as needed for wheezing or shortness of breath. 05/22/23   Christopher Savannah, PA-C  cetirizine  (ZYRTEC  ALLERGY) 10 MG tablet Take 1 tablet (10 mg total) by mouth daily. 05/22/23   Christopher Savannah, PA-C  erythromycin  ophthalmic ointment Place a 1/2 inch ribbon of ointment on the right upper eyelid lash line 3 times a day for 7 days 08/26/23   Mayer, Jodi R, NP  fluticasone (FLOVENT  HFA) 44 MCG/ACT inhaler Inhale 2 puffs into the lungs 2 (two) times daily. 04/14/19   Cris Burnard DEL, MD  ibuprofen  (ADVIL ) 600 MG tablet Take 1 tablet (600 mg total) by mouth every 6 (six) hours as needed. 05/23/19   Loreli Suzen BIRCH, CNM  naproxen  (NAPROSYN ) 500 MG tablet Take 1 tablet (500 mg total) by mouth 2 (two) times daily. 02/22/24   Logan Ubaldo NOVAK, PA-C  predniSONE  (DELTASONE ) 20 MG tablet Take 2 tablets daily with breakfast. 05/22/23   Christopher Savannah, PA-C  Prenatal Vit-Fe Fumarate-FA (PRENATAL VITAMIN) 27-0.8 MG TABS Take 1 tablet by mouth daily. 11/14/18   Chrystal Lamarr GORMAN, MD  promethazine -dextromethorphan (PROMETHAZINE -DM) 6.25-15 MG/5ML syrup Take 5 mLs by mouth 3 (three)  times daily as needed for cough. 05/22/23   Christopher Savannah, PA-C  varenicline  (CHANTIX  CONTINUING MONTH PAK) 1 MG tablet Take 1 tablet (1 mg total) by mouth 2 (two) times daily. 01/21/20   Dempsey Coy, MD    Allergies: Patient has no known allergies.    Review of Systems  Constitutional:  Negative for fever.  Respiratory:  Negative for cough and shortness of breath.   Cardiovascular:  Positive for chest pain. Negative for leg swelling.  All other systems reviewed and are negative.   Updated Vital Signs BP (!) 142/87   Pulse 85   Temp 97.6 F (36.4 C) (Oral)   Resp 17   LMP 03/18/2024   SpO2 100%   Physical Exam Vitals and nursing note reviewed.  Constitutional:      Appearance: She is well-developed. She is obese. She is not ill-appearing.  HENT:     Head: Normocephalic and atraumatic.  Eyes:     Pupils: Pupils are equal, round, and reactive to light.  Cardiovascular:     Rate and Rhythm: Normal rate and regular rhythm.     Heart sounds: Normal heart sounds.  Pulmonary:     Effort: Pulmonary effort is normal. No respiratory distress.     Breath sounds: No wheezing.  Abdominal:     Palpations: Abdomen is soft.  Musculoskeletal:     Cervical back: Neck supple.  Right lower leg: No tenderness. No edema.     Left lower leg: No tenderness. No edema.  Skin:    General: Skin is warm and dry.  Neurological:     Mental Status: She is alert and oriented to person, place, and time.  Psychiatric:        Mood and Affect: Mood normal.     (all labs ordered are listed, but only abnormal results are displayed) Labs Reviewed  BASIC METABOLIC PANEL WITH GFR - Abnormal; Notable for the following components:      Result Value   CO2 20 (*)    All other components within normal limits  CBC - Abnormal; Notable for the following components:   WBC 10.9 (*)    All other components within normal limits  HCG, SERUM, QUALITATIVE  TROPONIN T, HIGH SENSITIVITY  TROPONIN T, HIGH  SENSITIVITY    EKG: EKG Interpretation Date/Time:  Thursday March 28 2024 21:43:15 EDT Ventricular Rate:  80 PR Interval:  161 QRS Duration:  101 QT Interval:  379 QTC Calculation: 438 R Axis:   27  Text Interpretation: Sinus rhythm Low voltage, precordial leads RSR' in V1 or V2, probably normal variant Confirmed by Bari Pfeiffer ([id number]) on 03/29/2024 12:15:46 AM  Radiology: [facility] Chest 2 View Result Date: 03/28/2024 CLINICAL DATA:  Chest pain EXAM: CHEST - 2 VIEW COMPARISON:  03/12/2024 FINDINGS: The heart size and mediastinal contours are within normal limits. Both lungs are clear. The visualized skeletal structures are unremarkable. No pneumothorax. IMPRESSION: No active cardiopulmonary disease. Electronically Signed   By: Franky Crease M.D.   On: 03/28/2024 22:41     Procedures   Medications Ordered in the ED  ketorolac  (TORADOL ) 30 MG/ML injection 30 mg (has no administration in time range)                                    Medical Decision Making Amount and/or Complexity of Data Reviewed Labs: ordered. Radiology: ordered.  Risk Prescription drug management.   This patient presents to the ED for concern of chest pain, this involves an extensive number of treatment options, and is a complaint that carries with it a high risk of complications and morbidity.  I considered the following differential and admission for this acute, potentially life threatening condition.  The differential diagnosis includes ACS, PE, pneumothorax, pneumonia, chest wall pain, pleurisy  MDM:    This is a 33 year old female who presents with chest pain.  She is overall nontoxic and vital signs are reassuring.  She has had 2 prior chest pain evaluations with negative workup.  Workup today is reassuring including chest x-ray, EKG, basic lab work and 1 troponin.  Low suspicion for PE.  Discussed with the patient that if her symptoms are exacerbated with vaping it could be irritation of the  bronchi from vaping and she should stop.  Will give Toradol  and recommend NSAIDs otherwise.  (Labs, imaging, consults)  Labs: I Ordered, and personally interpreted labs.  The pertinent results include: CBC, BMP, troponin  Imaging Studies ordered: I ordered imaging studies including chest x-ray I independently visualized and interpreted imaging. I agree with the radiologist interpretation  Additional history obtained from chart review.  External records from outside source obtained and reviewed including prior evaluations  Cardiac Monitoring: The patient was maintained on a cardiac monitor.  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    Dispense:  30 tablet    Refill:  0    I have reviewed the patients home medicines and have made adjustments as needed  Problem List / ED Course: Problem List Items Addressed This Visit   None Visit Diagnoses       Atypical chest pain    -  Primary                Final diagnoses:  None    ED Discharge Orders     None          Bari Charmaine FALCON,  MD 03/29/24 [phone]

## 2024-03-29 NOTE — Discharge Instructions (Signed)
 You were seen today again for chest pain.  Your workup again today is reassuring.  Take ibuprofen  for pain.  You should stop vaping if this is exacerbating your chest discomfort.

## 2024-05-26 ENCOUNTER — Emergency Department (HOSPITAL_COMMUNITY): Payer: Self-pay

## 2024-05-26 ENCOUNTER — Ambulatory Visit (INDEPENDENT_AMBULATORY_CARE_PROVIDER_SITE_OTHER): Payer: Medicaid - Out of State

## 2024-05-26 ENCOUNTER — Other Ambulatory Visit: Payer: Self-pay

## 2024-05-26 ENCOUNTER — Ambulatory Visit
Admission: EM | Admit: 2024-05-26 | Discharge: 2024-05-26 | Disposition: A | Discharge status: Home / Self Care | Payer: Self-pay | Attending: Family Medicine | Admitting: Family Medicine

## 2024-05-26 ENCOUNTER — Emergency Department (HOSPITAL_COMMUNITY)
Admission: EM | Admit: 2024-05-26 | Discharge: 2024-05-26 | Disposition: A | Discharge status: Home / Self Care | Payer: Self-pay | Attending: Emergency Medicine | Admitting: Emergency Medicine

## 2024-05-26 ENCOUNTER — Ambulatory Visit: Payer: Self-pay | Admitting: Urgent Care

## 2024-05-26 ENCOUNTER — Encounter (HOSPITAL_COMMUNITY): Payer: Self-pay

## 2024-05-26 DIAGNOSIS — D72829 Elevated white blood cell count, unspecified: Secondary | ICD-10-CM | POA: Insufficient documentation

## 2024-05-26 DIAGNOSIS — R0981 Nasal congestion: Secondary | ICD-10-CM | POA: Insufficient documentation

## 2024-05-26 DIAGNOSIS — R079 Chest pain, unspecified: Secondary | ICD-10-CM | POA: Diagnosis not present

## 2024-05-26 DIAGNOSIS — Z7951 Long term (current) use of inhaled steroids: Secondary | ICD-10-CM | POA: Insufficient documentation

## 2024-05-26 DIAGNOSIS — J209 Acute bronchitis, unspecified: Secondary | ICD-10-CM

## 2024-05-26 DIAGNOSIS — R072 Precordial pain: Secondary | ICD-10-CM | POA: Insufficient documentation

## 2024-05-26 DIAGNOSIS — E119 Type 2 diabetes mellitus without complications: Secondary | ICD-10-CM | POA: Insufficient documentation

## 2024-05-26 DIAGNOSIS — R051 Acute cough: Secondary | ICD-10-CM | POA: Insufficient documentation

## 2024-05-26 DIAGNOSIS — J45909 Unspecified asthma, uncomplicated: Secondary | ICD-10-CM | POA: Insufficient documentation

## 2024-05-26 DIAGNOSIS — Z7952 Long term (current) use of systemic steroids: Secondary | ICD-10-CM | POA: Insufficient documentation

## 2024-05-26 DIAGNOSIS — R0789 Other chest pain: Secondary | ICD-10-CM

## 2024-05-26 LAB — POCT URINE DIPSTICK
Bilirubin, UA: NEGATIVE
Glucose, UA: NEGATIVE mg/dL
Ketones, POC UA: NEGATIVE mg/dL
Leukocytes, UA: NEGATIVE
Nitrite, UA: NEGATIVE
Protein Ur, POC: NEGATIVE mg/dL
Spec Grav, UA: 1.015 (ref 1.010–1.025)
Urobilinogen, UA: 0.2 U/dL
pH, UA: 6.5 (ref 5.0–8.0)

## 2024-05-26 LAB — CBC
HCT: 41.8 % (ref 36.0–46.0)
Hemoglobin: 13.2 g/dL (ref 12.0–15.0)
MCH: 28.6 pg (ref 26.0–34.0)
MCHC: 31.6 g/dL (ref 30.0–36.0)
MCV: 90.5 fL (ref 80.0–100.0)
Platelets: 339 K/uL (ref 150–400)
RBC: 4.62 MIL/uL (ref 3.87–5.11)
RDW: 13.5 % (ref 11.5–15.5)
WBC: 12.3 K/uL — ABNORMAL HIGH (ref 4.0–10.5)
nRBC: 0 % (ref 0.0–0.2)

## 2024-05-26 LAB — BASIC METABOLIC PANEL WITH GFR
Anion gap: 11 (ref 5–15)
BUN: 12 mg/dL (ref 6–20)
CO2: 20 mmol/L — ABNORMAL LOW (ref 22–32)
Calcium: 9.7 mg/dL (ref 8.9–10.3)
Chloride: 105 mmol/L (ref 98–111)
Creatinine, Ser: 0.83 mg/dL (ref 0.44–1.00)
GFR, Estimated: >60 mL/min (ref >60)
Glucose, Bld: 121 mg/dL — ABNORMAL HIGH (ref 70–99)
Potassium: 4.3 mmol/L (ref 3.5–5.1)
Sodium: 136 mmol/L (ref 135–145)

## 2024-05-26 LAB — POCT URINE PREGNANCY: Preg Test, Ur: NEGATIVE

## 2024-05-26 LAB — HCG, SERUM, QUALITATIVE: Preg, Serum: NEGATIVE

## 2024-05-26 LAB — TROPONIN T, HIGH SENSITIVITY: Troponin T High Sensitivity: <15 ng/L (ref 0–19)

## 2024-05-26 MED ORDER — PREDNISONE 20 MG PO TABS: ORAL_TABLET | ORAL | 0 refills | Status: DC

## 2024-05-26 MED ORDER — NAPROXEN 500 MG PO TABS: 500.0000 mg | ORAL_TABLET | Freq: Two times a day (BID) | ORAL | 0 refills | Status: DC

## 2024-05-26 MED ORDER — BENZONATATE 100 MG PO CAPS: 100.0000 mg | ORAL_CAPSULE | Freq: Three times a day (TID) | ORAL | 0 refills | Status: DC

## 2024-05-26 NOTE — Discharge Instructions (Addendum)
 Please start prednisone  to manage for acute bronchitis.  Your chest x-ray was normal and therefore we will hold off on antibiotics.

## 2024-05-26 NOTE — ED Triage Notes (Signed)
 Pt reports left lower chest pain that has been ongoing for the past 2 weeks. Sts concern d/t increased frequency. Pain is now radiating into the center of her chest. Associated with dizziness. No SOB. No injury. No cardiac hx.

## 2024-05-26 NOTE — ED Provider Notes (Signed)
 [facility]  Note:  This document was prepared using Conservation officer, historic buildings and may include unintentional dictation errors.  MRN: [id number] DOB: 1990/12/30  Subjective:   Joanna [name] is a 33 y.o. female presenting for 1 to 2-week history of persistent intermittent left-sided upper abdominal/lower chest pain.  Has also been coughing pretty consistently.  Patient is concerned expressing that she feels something is moving in that side of her torso.  Cannot recall any particular inciting event, penetrating foreign body or injury.  No fever, nausea, vomiting, diarrhea, bloody stools, constipation.  Last bowel movement was today.  She does have a history of asthma.  Patient is a smoker.  No current facility-administered medications for this encounter.  Current Outpatient Medications:    hydrOXYzine  (ATARAX ) 25 MG tablet, Take 0.5-1 tablets (12.5-25 mg total) by mouth every 8 (eight) hours as needed for itching., Disp: 30 tablet, Rfl: 0   albuterol  (VENTOLIN  HFA) 108 (90 Base) MCG/ACT inhaler, Inhale 2 puffs into the lungs every 4 (four) hours as needed for wheezing or shortness of breath., Disp: 18 g, Rfl: 0   cetirizine  (ZYRTEC  ALLERGY) 10 MG tablet, Take 1 tablet (10 mg total) by mouth daily., Disp: 30 tablet, Rfl: 0   erythromycin  ophthalmic ointment, Place a 1/2 inch ribbon of ointment on the right upper eyelid lash line 3 times a day for 7 days, Disp: 3.5 g, Rfl: 0   FLUoxetine  (PROZAC ) 10 MG capsule, Take 1 capsule (10 mg total) by mouth daily., Disp: 90 capsule, Rfl: 0   fluticasone (FLOVENT  HFA) 44 MCG/ACT inhaler, Inhale 2 puffs into the lungs 2 (two) times daily., Disp: 1 Inhaler, Rfl: 12   ibuprofen  (ADVIL ) 600 MG tablet, Take 1 tablet (600 mg total) by mouth every 6 (six) hours as needed., Disp: 30 tablet, Rfl: 0   ibuprofen  (ADVIL ) 600 MG tablet, Take 1 tablet (600 mg total) by mouth every 6 (six) hours as needed., Disp: 30 tablet, Rfl: 0    naproxen  (NAPROSYN ) 500 MG tablet, Take 1 tablet (500 mg total) by mouth 2 (two) times daily., Disp: 30 tablet, Rfl: 0   predniSONE  (DELTASONE ) 20 MG tablet, Take 2 tablets daily with breakfast., Disp: 10 tablet, Rfl: 0   Prenatal Vit-Fe Fumarate-FA (PRENATAL VITAMIN) 27-0.8 MG TABS, Take 1 tablet by mouth daily., Disp: 90 tablet, Rfl: 3   promethazine -dextromethorphan (PROMETHAZINE -DM) 6.25-15 MG/5ML syrup, Take 5 mLs by mouth 3 (three) times daily as needed for cough., Disp: 200 mL, Rfl: 0   varenicline  (CHANTIX  CONTINUING MONTH PAK) 1 MG tablet, Take 1 tablet (1 mg total) by mouth 2 (two) times daily., Disp: 60 tablet, Rfl: 0   No Known Allergies  Past Medical History:  Diagnosis Date   Asthma    inhaler used 3 months   Diabetes mellitus without complication (HCC)    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   IUGR (intrauterine growth restriction) affecting care of mother 04/18/2019   Transient hypertension of pregnancy 04/08/2019   Urinary tract infection    Pyelonephritis during pregnancy     Past Surgical History:  Procedure Laterality Date   HERNIA REPAIR      Family History  Problem Relation Age of Onset   Kidney disease Mother    Alcohol abuse Neg Hx    Arthritis Neg Hx    Asthma Neg Hx    Birth defects Neg Hx    Cancer Neg Hx    COPD Neg Hx    Depression Neg Hx  Drug abuse Neg Hx    Diabetes Neg Hx    Early death Neg Hx    Hearing loss Neg Hx    Heart disease Neg Hx    Hyperlipidemia Neg Hx    Hypertension Neg Hx    Learning disabilities Neg Hx    Mental illness Neg Hx    Mental retardation Neg Hx    Miscarriages / Stillbirths Neg Hx    Stroke Neg Hx    Vision loss Neg Hx    Varicose Veins Neg Hx     Social History   Tobacco Use   Smoking status: Every Day    Current packs/day: 0.50    Average packs/day: 0.5 packs/day for 2.0 years (1.0 ttl pk-yrs)    Types: Cigarettes   Smokeless tobacco: Never  Vaping Use   Vaping status: Every Day  Substance Use Topics   Alcohol  use: No   Drug use: No    ROS   Objective:   Vitals: BP 112/78 (BP Location: Right Arm)   Pulse 86   Temp 98.7 F (37.1 C) (Oral)   Resp 16   Ht 5' 3 (1.6 m)   Wt 230 lb (104.3 kg)   LMP 05/13/2024   SpO2 98%   BMI 40.74 kg/m   Physical Exam Constitutional:      General: She is not in acute distress.    Appearance: Normal appearance. She is well-developed. She is not ill-appearing, toxic-appearing or diaphoretic.  HENT:     Head: Normocephalic and atraumatic.     Nose: Nose normal.     Mouth/Throat:     Mouth: Mucous membranes are moist.  Eyes:     General: No scleral icterus.       Right eye: No discharge.        Left eye: No discharge.     Extraocular Movements: Extraocular movements intact.     Conjunctiva/sclera: Conjunctivae normal.  Cardiovascular:     Rate and Rhythm: Normal rate and regular rhythm.     Heart sounds: Normal heart sounds. No murmur heard.    No friction rub. No gallop.  Pulmonary:     Effort: Pulmonary effort is normal. No respiratory distress.     Breath sounds: No stridor. No wheezing, rhonchi or rales.  Chest:     Chest wall: No tenderness.  Abdominal:     General: Bowel sounds are normal. There is no distension.     Palpations: Abdomen is soft. There is no mass.     Tenderness: There is no abdominal tenderness. There is no right CVA tenderness, left CVA tenderness, guarding or rebound.  Musculoskeletal:     Lumbar back: No swelling, edema, deformity, signs of trauma, lacerations, spasms, tenderness or bony tenderness. Normal range of motion. Negative right straight leg raise test and negative left straight leg raise test. No scoliosis.  Skin:    General: Skin is warm and dry.  Neurological:     General: No focal deficit present.     Mental Status: She is alert and oriented to person, place, and time.  Psychiatric:        Mood and Affect: Mood normal.        Behavior: Behavior normal.        Thought Content: Thought content  normal.        Judgment: Judgment normal.     Results for orders placed or performed during the hospital encounter of 05/26/24 (from the past 24 hours)  POCT urine  pregnancy     Status: Normal   Collection Time: 05/26/24 12:35 PM  Result Value Ref Range   Preg Test, Ur Negative Negative  POCT URINE DIPSTICK     Status: Abnormal   Collection Time: 05/26/24 12:35 PM  Result Value Ref Range   Color, UA yellow yellow   Clarity, UA clear clear   Glucose, UA negative negative mg/dL   Bilirubin, UA negative negative   Ketones, POC UA negative negative mg/dL   Spec Grav, UA 8.984 8.989 - 1.025   Blood, UA trace-intact (A) negative   pH, UA 6.5 5.0 - 8.0   Protein Ur, POC negative negative mg/dL   Urobilinogen, UA 0.2 0.2 or 1.0 E.U./dL   Nitrite, UA Negative Negative   Leukocytes, UA Negative Negative   DG Chest 2 View Result Date: 05/26/2024 EXAM: 2 VIEW(S) XRAY OF THE CHEST 05/26/2024 01:00:59 PM COMPARISON: Chest radiograph 03/28/2024. CLINICAL HISTORY: Left sided chest pain. FINDINGS: LUNGS AND PLEURA: No focal pulmonary opacity. No pulmonary edema. No pleural effusion. No pneumothorax. HEART AND MEDIASTINUM: No acute abnormality of the cardiac and mediastinal silhouettes. BONES AND SOFT TISSUES: No acute osseous abnormality. IMPRESSION: 1. No acute cardiopulmonary findings. Electronically signed by: Harrietta Sherry MD 05/26/2024 01:32 PM EST RP Workstation: [id number]   Assessment and Plan :   PDMP not reviewed this encounter.  1. Acute bronchitis, unspecified organism   2. Left-sided chest pain   3. Acute cough    No tenderness elicited on exam.  Recommended managing for bronchitis with prednisone  given her coughing, left sided chest pain and underlying asthma, smoking.  Counseled patient on potential for adverse effects with medications prescribed/recommended today, ER and return-to-clinic precautions discussed, patient verbalized understanding.    Christopher Savannah, PA-C 05/26/24  [id number]

## 2024-05-26 NOTE — Discharge Instructions (Addendum)
 You are seen today for chest pain.  Your lab work imaging today was reassuring.  You will need to continue to follow-up with PCP for further management.  Take the prednisone  regularly as prescribed as well as the Tessalon and naproxen  as needed.  Return to the ER for new or worsening symptoms.

## 2024-05-26 NOTE — ED Provider Notes (Signed)
 [facility] AT [facility] Provider Note   CSN: [id number] Arrival date & time: 05/26/24  8087     Patient presents with: Chest Pain   Joanna [name] is a 33 y.o. female.  Chest Pain Associated symptoms: cough   Patient is a 33 year old female presenting today for concerns for 2-week history of intermittent chest pain, worse with coughing, noted to have a 2-minute episode earlier today described as sharp in the central sternal region.  Seen by urgent care today and was prescribed prednisone .  Endorsing URI symptoms of cough, congestion x 2 weeks.  Denies fever, headache, vision change, dysphagia, odynophagia, shortness of breath, hemoptysis, abdominal pain, nausea,, diarrhea, dysuria, lower leg swelling.     Prior to Admission medications   Medication Sig Start Date End Date Taking? Authorizing Provider  benzonatate (TESSALON) 100 MG capsule Take 1 capsule (100 mg total) by mouth every 8 (eight) hours. 05/26/24  Yes Nikola Marone S, PA-C  naproxen  (NAPROSYN ) 500 MG tablet Take 1 tablet (500 mg total) by mouth 2 (two) times daily. 05/26/24  Yes Jaxie Racanelli S, PA-C  albuterol  (VENTOLIN  HFA) 108 (90 Base) MCG/ACT inhaler Inhale 2 puffs into the lungs every 4 (four) hours as needed for wheezing or shortness of breath. 05/22/23   Christopher Savannah, PA-C  cetirizine  (ZYRTEC  ALLERGY) 10 MG tablet Take 1 tablet (10 mg total) by mouth daily. 05/22/23   Christopher Savannah, PA-C  erythromycin  ophthalmic ointment Place a 1/2 inch ribbon of ointment on the right upper eyelid lash line 3 times a day for 7 days 08/26/23   Mayer, Jodi R, NP  FLUoxetine  (PROZAC ) 10 MG capsule Take 1 capsule (10 mg total) by mouth daily. 03/29/24   Christopher Savannah, PA-C  fluticasone (FLOVENT  HFA) 44 MCG/ACT inhaler Inhale 2 puffs into the lungs 2 (two) times daily. 04/14/19   Cris Burnard DEL, MD  hydrOXYzine  (ATARAX ) 25 MG tablet Take 0.5-1 tablets (12.5-25 mg total) by mouth every 8 (eight) hours as  needed for itching. 03/29/24   Christopher Savannah, PA-C  ibuprofen  (ADVIL ) 600 MG tablet Take 1 tablet (600 mg total) by mouth every 6 (six) hours as needed. 05/23/19   Loreli Suzen BIRCH, CNM  ibuprofen  (ADVIL ) 600 MG tablet Take 1 tablet (600 mg total) by mouth every 6 (six) hours as needed. 03/29/24   Horton, Charmaine FALCON, MD  predniSONE  (DELTASONE ) 20 MG tablet Take 2 tablets daily with breakfast. 05/26/24   Christopher Savannah, PA-C  Prenatal Vit-Fe Fumarate-FA (PRENATAL VITAMIN) 27-0.8 MG TABS Take 1 tablet by mouth daily. 11/14/18   Chrystal Lamarr GORMAN, MD  promethazine -dextromethorphan (PROMETHAZINE -DM) 6.25-15 MG/5ML syrup Take 5 mLs by mouth 3 (three) times daily as needed for cough. 05/22/23   Christopher Savannah, PA-C  varenicline  (CHANTIX  CONTINUING MONTH PAK) 1 MG tablet Take 1 tablet (1 mg total) by mouth 2 (two) times daily. 01/21/20   Dempsey Coy, MD    Allergies: Patient has no known allergies.    Review of Systems  Respiratory:  Positive for cough.   Cardiovascular:  Positive for chest pain.  All other systems reviewed and are negative.   Updated Vital Signs BP 126/76 (BP Location: Left Arm)   Pulse 84   Temp 98.5 F (36.9 C) (Oral)   Resp 15   LMP 05/13/2024   SpO2 94%   Physical Exam Vitals and nursing note reviewed.  Constitutional:      General: She is not in acute distress.    Appearance: Normal appearance.  She is not ill-appearing or diaphoretic.  HENT:     Head: Normocephalic and atraumatic.  Eyes:     General: No scleral icterus.       Right eye: No discharge.        Left eye: No discharge.     Extraocular Movements: Extraocular movements intact.     Conjunctiva/sclera: Conjunctivae normal.     Pupils: Pupils are equal, round, and reactive to light.  Cardiovascular:     Rate and Rhythm: Normal rate and regular rhythm.     Pulses: Normal pulses.     Heart sounds: Normal heart sounds. No murmur heard.    No friction rub. No gallop.  Pulmonary:     Effort: Pulmonary effort is  normal. No respiratory distress.     Breath sounds: Normal breath sounds. No stridor. No decreased breath sounds, wheezing, rhonchi or rales.  Chest:     Chest wall: No tenderness.  Abdominal:     General: Abdomen is flat. There is no distension.     Palpations: Abdomen is soft.     Tenderness: There is no abdominal tenderness. There is no right CVA tenderness, left CVA tenderness, guarding or rebound.  Musculoskeletal:        General: No swelling, deformity or signs of injury.     Cervical back: Normal range of motion. No rigidity or tenderness.     Right lower leg: No edema.     Left lower leg: No edema.  Skin:    General: Skin is warm and dry.     Findings: No bruising, erythema or lesion.  Neurological:     General: No focal deficit present.     Mental Status: She is alert and oriented to person, place, and time. Mental status is at baseline.     Sensory: No sensory deficit.     Motor: No weakness.  Psychiatric:        Mood and Affect: Mood normal.     (all labs ordered are listed, but only abnormal results are displayed) Labs Reviewed  BASIC METABOLIC PANEL WITH GFR - Abnormal; Notable for the following components:      Result Value   CO2 20 (*)    Glucose, Bld 121 (*)    All other components within normal limits  CBC - Abnormal; Notable for the following components:   WBC 12.3 (*)    All other components within normal limits  HCG, SERUM, QUALITATIVE  TROPONIN T, HIGH SENSITIVITY    EKG: None  Radiology: DG Chest 2 View Result Date: 05/26/2024 EXAM: 2 VIEW(S) XRAY OF THE CHEST 05/26/2024 08:11:00 PM COMPARISON: 05/26/2024 CLINICAL HISTORY: CP CP FINDINGS: LUNGS AND PLEURA: No focal pulmonary opacity. No pleural effusion. No pneumothorax. HEART AND MEDIASTINUM: No acute abnormality of the cardiac and mediastinal silhouettes. BONES AND SOFT TISSUES: No acute osseous abnormality. IMPRESSION: 1. No acute cardiopulmonary process. Electronically signed by: Oneil Devonshire MD  05/26/2024 08:14 PM EST RP Workstation: [id number]   DG Chest 2 View Result Date: 05/26/2024 EXAM: 2 VIEW(S) XRAY OF THE CHEST 05/26/2024 01:00:59 PM COMPARISON: Chest radiograph 03/28/2024. CLINICAL HISTORY: Left sided chest pain. FINDINGS: LUNGS AND PLEURA: No focal pulmonary opacity. No pulmonary edema. No pleural effusion. No pneumothorax. HEART AND MEDIASTINUM: No acute abnormality of the cardiac and mediastinal silhouettes. BONES AND SOFT TISSUES: No acute osseous abnormality. IMPRESSION: 1. No acute cardiopulmonary findings. Electronically signed by: Harrietta Sherry MD 05/26/2024 01:32 PM EST RP Workstation: [id number]   Procedures   Medications Ordered  in the ED - No data to display                          Geneva (Revised) Score: 3, Geneva Score Interpretation: Low Risk Group: 7-9% incidence of pulmonary embolism from several studies   Medical Decision Making Amount and/or Complexity of Data Reviewed Labs: ordered. Radiology: ordered.   This patient is a 33 year old female who presents to the ED for concern of substernal chest pain, URI symptoms x 2 weeks intermittently, noted to have  an episode today of sharp sternal chest pain that lasted approximately 2 minutes.  Currently asymptomatic.  On physical exam, patient is in no acute distress, afebrile, alert and orient x 4, speaking in full sentences, nontachypneic, nontachycardic.  LCTAB, RRR, no murmur, no lower leg edema, no chest wall tenderness to palpation, unremarkable exam otherwise.  No suspicion for PE, ACS, pneumonia, AAS.  Lab work is unremarkable, showing a nonspecific elevation of WBC likely secondary to URI.  No other infectious symptoms at this time.  Pain worse with coughing, suspicious of costochondritis.  Will send home with anti-inflammatories with patient already prescribed prednisone  as well as with sending home with naproxen  and Tessalon.  Patient vital signs have remained stable throughout the course of patient's  time in the ED. Low suspicion for any other emergent pathology at this time. I believe this patient is safe to be discharged. Provided strict return to ER precautions. Patient expressed agreement and understanding of plan. All questions were answered.  Differential diagnoses prior to evaluation: The emergent differential diagnosis includes, but is not limited to, ACS, AAS, Pulmonary Embolism, Tension Pneumothorax, Esophageal Rupture, Cardiac Tamponade, Pericarditis, Myocarditis, Pneumothorax, Pneumonia, Aortic Stenosis, CHF Exacerbation, GERD,  Esophageal Spasm,  Mallory-Weiss, Costochondritis, Musculoskeletal Chest Wall Pain, Anxiety / Panic Attack. This is not an exhaustive differential.   Past Medical History / Co-morbidities / Social History: Diabetes, asthma  Additional history: Chart reviewed. Pertinent results include:   Seen by urgent care today with 1 to 2-week history of persistent intermittent left-sided upper abdominal/lower chest pain, coughing consistently.  Reports that she feels like something is moving in that side of her torso according to provider.  No tenderness on exam.  Provided prednisone .  Lab Tests/Imaging studies: I personally interpreted labs/imaging and the pertinent results include:    .  CBC notes a mildly elevated leukocytosis of 12.3, likely secondary to URI BMP unremarkable hCG qualitative unremarkable Troponin remarkable Chest x-ray unremarkable   I agree with the radiologist interpretation.  Cardiac monitoring: EKG obtained and interpreted by myself and attending physician which shows: NSR     Medications: I ordered medication including Tessalon, naproxen .  I have reviewed the patients home medicines and have made adjustments as needed.  Critical Interventions: None  Social Determinants of Health: Reports no PCP at this time.  Disposition: After consideration of the diagnostic results and the patients response to treatment, I feel that the  patient would benefit from discharge intern as well.   emergency department workup does not suggest an emergent condition requiring admission or immediate intervention beyond what has been performed at this time. The plan is: Establish care with PCP, symptomatic management at home. The patient is safe for discharge and has been instructed to return immediately for worsening symptoms, change in symptoms or any other concerns.   Final diagnoses:  Chest pain, mid sternal  Acute cough    ED Discharge Orders  Ordered    benzonatate (TESSALON) 100 MG capsule  Every 8 hours        05/26/24 2210    naproxen  (NAPROSYN ) 500 MG tablet  2 times daily        05/26/24 2210               Beola Terrall RAMAN, [place] 05/26/24 2221    Patt Alm Macho, MD 05/26/24 [phone]

## 2024-05-26 NOTE — ED Triage Notes (Signed)
 Patient c/o of left side abdominal pain radiating to middle and right side of abdomen. Pt stated she has been experiencing pain x 1-2 weeks. Pt stated that she took Phazyme gas medication this AM which was not effective.

## 2024-07-15 ENCOUNTER — Ambulatory Visit: Admission: EM | Admit: 2024-07-15 | Discharge: 2024-07-15 | Discharge status: Against Medical Advice | Payer: Self-pay

## 2024-07-15 NOTE — ED Triage Notes (Addendum)
 Pt has c/o colde chills and lower back head pain  x 3 days. Pt has been taking excedrin at home with little relief. Last dose of excedrin at 4:30 pm.

## 2024-07-15 NOTE — ED Notes (Signed)
 Pt is not in the room.   Pt no answered her phone.
